# Patient Record
Sex: Female | Born: 1940 | Race: Black or African American | Hispanic: No | Marital: Single | Smoking: Current every day smoker
Health system: Southern US, Community
[De-identification: ages and names within clinical notes are randomized; demographics above are authoritative.]

## PROBLEM LIST (undated history)

## (undated) DIAGNOSIS — Z8679 Personal history of other diseases of the circulatory system: Secondary | ICD-10-CM

## (undated) DIAGNOSIS — K7689 Other specified diseases of liver: Secondary | ICD-10-CM

## (undated) DIAGNOSIS — J439 Emphysema, unspecified: Secondary | ICD-10-CM

## (undated) DIAGNOSIS — K279 Peptic ulcer, site unspecified, unspecified as acute or chronic, without hemorrhage or perforation: Secondary | ICD-10-CM

## (undated) DIAGNOSIS — I10 Essential (primary) hypertension: Secondary | ICD-10-CM

---

## 2017-11-07 DIAGNOSIS — R1032 Left lower quadrant pain: Secondary | ICD-10-CM | POA: Insufficient documentation

## 2017-11-07 DIAGNOSIS — R9431 Abnormal electrocardiogram [ECG] [EKG]: Secondary | ICD-10-CM | POA: Insufficient documentation

## 2017-11-07 DIAGNOSIS — F17200 Nicotine dependence, unspecified, uncomplicated: Secondary | ICD-10-CM | POA: Insufficient documentation

## 2020-02-13 ENCOUNTER — Ambulatory Visit: Payer: Self-pay | Admitting: Medical

## 2020-02-13 ENCOUNTER — Emergency Department (HOSPITAL_COMMUNITY): Payer: Medicare (Managed Care)

## 2020-02-13 ENCOUNTER — Ambulatory Visit (HOSPITAL_COMMUNITY)
Admission: EM | Admit: 2020-02-13 | Discharge: 2020-02-13 | Disposition: A | Discharge status: Home / Self Care | Payer: Self-pay | Attending: Family Medicine | Admitting: Family Medicine

## 2020-02-13 ENCOUNTER — Other Ambulatory Visit: Payer: Self-pay

## 2020-02-13 ENCOUNTER — Encounter (HOSPITAL_COMMUNITY): Payer: Self-pay | Admitting: Internal Medicine

## 2020-02-13 ENCOUNTER — Inpatient Hospital Stay (HOSPITAL_COMMUNITY)
Admission: EM | Admit: 2020-02-13 | Discharge: 2020-02-16 | DRG: 177 | Disposition: A | Discharge status: Home Health | Payer: Medicare (Managed Care) | Attending: Internal Medicine | Admitting: Internal Medicine

## 2020-02-13 DIAGNOSIS — F17210 Nicotine dependence, cigarettes, uncomplicated: Secondary | ICD-10-CM | POA: Diagnosis present

## 2020-02-13 DIAGNOSIS — F0391 Unspecified dementia with behavioral disturbance: Secondary | ICD-10-CM | POA: Diagnosis present

## 2020-02-13 DIAGNOSIS — I10 Essential (primary) hypertension: Secondary | ICD-10-CM | POA: Diagnosis not present

## 2020-02-13 DIAGNOSIS — I208 Other forms of angina pectoris: Secondary | ICD-10-CM | POA: Diagnosis present

## 2020-02-13 DIAGNOSIS — I5032 Chronic diastolic (congestive) heart failure: Secondary | ICD-10-CM | POA: Diagnosis present

## 2020-02-13 DIAGNOSIS — J441 Chronic obstructive pulmonary disease with (acute) exacerbation: Secondary | ICD-10-CM | POA: Diagnosis present

## 2020-02-13 DIAGNOSIS — K7689 Other specified diseases of liver: Secondary | ICD-10-CM | POA: Diagnosis present

## 2020-02-13 DIAGNOSIS — J440 Chronic obstructive pulmonary disease with acute lower respiratory infection: Secondary | ICD-10-CM | POA: Diagnosis present

## 2020-02-13 DIAGNOSIS — K279 Peptic ulcer, site unspecified, unspecified as acute or chronic, without hemorrhage or perforation: Secondary | ICD-10-CM | POA: Diagnosis present

## 2020-02-13 DIAGNOSIS — Z8711 Personal history of peptic ulcer disease: Secondary | ICD-10-CM

## 2020-02-13 DIAGNOSIS — I110 Hypertensive heart disease with heart failure: Secondary | ICD-10-CM | POA: Diagnosis present

## 2020-02-13 DIAGNOSIS — R0603 Acute respiratory distress: Secondary | ICD-10-CM

## 2020-02-13 DIAGNOSIS — R0602 Shortness of breath: Secondary | ICD-10-CM | POA: Diagnosis not present

## 2020-02-13 DIAGNOSIS — J9601 Acute respiratory failure with hypoxia: Secondary | ICD-10-CM | POA: Diagnosis present

## 2020-02-13 DIAGNOSIS — Z66 Do not resuscitate: Secondary | ICD-10-CM | POA: Diagnosis present

## 2020-02-13 DIAGNOSIS — U071 COVID-19: Secondary | ICD-10-CM | POA: Diagnosis present

## 2020-02-13 DIAGNOSIS — F0390 Unspecified dementia without behavioral disturbance: Secondary | ICD-10-CM | POA: Diagnosis not present

## 2020-02-13 DIAGNOSIS — R011 Cardiac murmur, unspecified: Secondary | ICD-10-CM | POA: Diagnosis present

## 2020-02-13 DIAGNOSIS — R0600 Dyspnea, unspecified: Principal | ICD-10-CM

## 2020-02-13 DIAGNOSIS — J449 Chronic obstructive pulmonary disease, unspecified: Secondary | ICD-10-CM | POA: Diagnosis present

## 2020-02-13 DIAGNOSIS — F03918 Unspecified dementia, unspecified severity, with other behavioral disturbance: Secondary | ICD-10-CM | POA: Diagnosis present

## 2020-02-13 DIAGNOSIS — R0682 Tachypnea, not elsewhere classified: Secondary | ICD-10-CM

## 2020-02-13 DIAGNOSIS — R600 Localized edema: Secondary | ICD-10-CM

## 2020-02-13 DIAGNOSIS — R9431 Abnormal electrocardiogram [ECG] [EKG]: Secondary | ICD-10-CM

## 2020-02-13 HISTORY — DX: Other specified diseases of liver: K76.89

## 2020-02-13 HISTORY — DX: Personal history of other diseases of the circulatory system: Z86.79

## 2020-02-13 HISTORY — DX: Essential (primary) hypertension: I10

## 2020-02-13 HISTORY — DX: Emphysema, unspecified: J43.9

## 2020-02-13 HISTORY — DX: Peptic ulcer, site unspecified, unspecified as acute or chronic, without hemorrhage or perforation: K27.9

## 2020-02-13 LAB — BASIC METABOLIC PANEL
Anion gap: 8 (ref 5–15)
BUN: 20 mg/dL (ref 8–23)
CO2: 35 mmol/L — ABNORMAL HIGH (ref 22–32)
Calcium: 8.9 mg/dL (ref 8.9–10.3)
Chloride: 96 mmol/L — ABNORMAL LOW (ref 98–111)
Creatinine, Ser: 0.97 mg/dL (ref 0.44–1.00)
GFR, Estimated: 59 mL/min — ABNORMAL LOW (ref >60)
Glucose, Bld: 92 mg/dL (ref 70–99)
Potassium: 3.5 mmol/L (ref 3.5–5.1)
Sodium: 139 mmol/L (ref 135–145)

## 2020-02-13 LAB — CBC WITH DIFFERENTIAL/PLATELET
Abs Immature Granulocytes: 0.02 10*3/uL (ref 0.00–0.07)
Basophils Absolute: 0 10*3/uL (ref 0.0–0.1)
Basophils Relative: 1 %
Eosinophils Absolute: 0 10*3/uL (ref 0.0–0.5)
Eosinophils Relative: 0 %
HCT: 44.5 % (ref 36.0–46.0)
Hemoglobin: 13.7 g/dL (ref 12.0–15.0)
Immature Granulocytes: 0 %
Lymphocytes Relative: 13 %
Lymphs Abs: 0.8 10*3/uL (ref 0.7–4.0)
MCH: 28.8 pg (ref 26.0–34.0)
MCHC: 30.8 g/dL (ref 30.0–36.0)
MCV: 93.5 fL (ref 80.0–100.0)
Monocytes Absolute: 0.7 10*3/uL (ref 0.1–1.0)
Monocytes Relative: 12 %
Neutro Abs: 4.7 10*3/uL (ref 1.7–7.7)
Neutrophils Relative %: 74 %
Platelets: 287 10*3/uL (ref 150–400)
RBC: 4.76 MIL/uL (ref 3.87–5.11)
RDW: 15.9 % — ABNORMAL HIGH (ref 11.5–15.5)
WBC: 6.2 10*3/uL (ref 4.0–10.5)
nRBC: 0 % (ref 0.0–0.2)

## 2020-02-13 LAB — TROPONIN I (HIGH SENSITIVITY)
Troponin I (High Sensitivity): 7 ng/L (ref <18)
Troponin I (High Sensitivity): 6 ng/L (ref <18)

## 2020-02-13 LAB — BRAIN NATRIURETIC PEPTIDE: B Natriuretic Peptide: 148.4 pg/mL — ABNORMAL HIGH (ref 0.0–100.0)

## 2020-02-13 LAB — D-DIMER, QUANTITATIVE: D-Dimer, Quant: 2.37 ug/mL-FEU — ABNORMAL HIGH (ref 0.00–0.50)

## 2020-02-13 MED ORDER — AEROCHAMBER PLUS FLO-VU MEDIUM MISC
1.0000 | Freq: Once | Status: DC
  Filled 2020-02-13: qty 1

## 2020-02-13 MED ORDER — ISOSORBIDE MONONITRATE ER 30 MG PO TB24
30.0000 mg | ORAL_TABLET | Freq: Every day | ORAL | Status: DC
Start: 2020-02-13 — End: 2020-02-16
  Administered 2020-02-13 – 2020-02-16 (×4): 30 mg via ORAL
  Filled 2020-02-13 (×5): qty 1

## 2020-02-13 MED ORDER — SODIUM CHLORIDE 0.9 % IV SOLN: INTRAVENOUS | Status: DC

## 2020-02-13 MED ORDER — SODIUM CHLORIDE 0.9 % IV SOLN: 250.0000 mL | INTRAVENOUS | Status: DC | PRN

## 2020-02-13 MED ORDER — LISINOPRIL 20 MG PO TABS
20.0000 mg | ORAL_TABLET | Freq: Every day | ORAL | Status: DC
Start: 2020-02-13 — End: 2020-02-16
  Administered 2020-02-13 – 2020-02-16 (×4): 20 mg via ORAL
  Filled 2020-02-13 (×4): qty 1

## 2020-02-13 MED ORDER — IPRATROPIUM BROMIDE HFA 17 MCG/ACT IN AERS
2.0000 | INHALATION_SPRAY | Freq: Once | RESPIRATORY_TRACT | Status: AC
  Administered 2020-02-13: 2 via RESPIRATORY_TRACT
  Filled 2020-02-13: qty 12.9

## 2020-02-13 MED ORDER — FAMOTIDINE 20 MG PO TABS
20.0000 mg | ORAL_TABLET | Freq: Two times a day (BID) | ORAL | Status: DC
Start: 2020-02-13 — End: 2020-02-16
  Administered 2020-02-13 – 2020-02-16 (×6): 20 mg via ORAL
  Filled 2020-02-13 (×6): qty 1

## 2020-02-13 MED ORDER — METHYLPREDNISOLONE SODIUM SUCC 40 MG IJ SOLR
40.0000 mg | Freq: Four times a day (QID) | INTRAMUSCULAR | Status: DC
  Administered 2020-02-13 – 2020-02-14 (×3): 40 mg via INTRAVENOUS
  Filled 2020-02-13 (×3): qty 1

## 2020-02-13 MED ORDER — HYDROCHLOROTHIAZIDE 12.5 MG PO CAPS
12.5000 mg | ORAL_CAPSULE | Freq: Every day | ORAL | Status: DC
  Administered 2020-02-13 – 2020-02-16 (×4): 12.5 mg via ORAL
  Filled 2020-02-13 (×4): qty 1

## 2020-02-13 MED ORDER — MOMETASONE FURO-FORMOTEROL FUM 200-5 MCG/ACT IN AERO
2.0000 | INHALATION_SPRAY | Freq: Two times a day (BID) | RESPIRATORY_TRACT | Status: DC
  Administered 2020-02-13 – 2020-02-16 (×5): 2 via RESPIRATORY_TRACT
  Filled 2020-02-13: qty 8.8

## 2020-02-13 MED ORDER — IOHEXOL 350 MG/ML SOLN
70.0000 mL | Freq: Once | INTRAVENOUS | Status: AC | PRN
  Administered 2020-02-13: 70 mL via INTRAVENOUS

## 2020-02-13 MED ORDER — AMLODIPINE BESYLATE 10 MG PO TABS
10.0000 mg | ORAL_TABLET | Freq: Every day | ORAL | Status: DC
  Administered 2020-02-13 – 2020-02-16 (×4): 10 mg via ORAL
  Filled 2020-02-13 (×2): qty 2
  Filled 2020-02-13 (×2): qty 1

## 2020-02-13 MED ORDER — SODIUM CHLORIDE 0.9% FLUSH: 3.0000 mL | INTRAVENOUS | Status: DC | PRN

## 2020-02-13 MED ORDER — ENOXAPARIN SODIUM 30 MG/0.3ML ~~LOC~~ SOLN
30.0000 mg | SUBCUTANEOUS | Status: DC
  Administered 2020-02-13 – 2020-02-14 (×2): 30 mg via SUBCUTANEOUS
  Filled 2020-02-13 (×3): qty 0.3

## 2020-02-13 MED ORDER — ALBUTEROL SULFATE HFA 108 (90 BASE) MCG/ACT IN AERS
2.0000 | INHALATION_SPRAY | Freq: Four times a day (QID) | RESPIRATORY_TRACT | Status: AC
  Administered 2020-02-13 – 2020-02-15 (×5): 2 via RESPIRATORY_TRACT
  Filled 2020-02-13 (×2): qty 6.7

## 2020-02-13 MED ORDER — SODIUM CHLORIDE 0.9% FLUSH
3.0000 mL | Freq: Two times a day (BID) | INTRAVENOUS | Status: DC
  Administered 2020-02-13 – 2020-02-16 (×6): 3 mL via INTRAVENOUS

## 2020-02-13 MED ORDER — METHYLPREDNISOLONE SODIUM SUCC 125 MG IJ SOLR
125.0000 mg | Freq: Once | INTRAMUSCULAR | Status: AC
  Administered 2020-02-13: 125 mg via INTRAVENOUS
  Filled 2020-02-13 (×2): qty 2

## 2020-02-13 MED ORDER — ALBUTEROL SULFATE HFA 108 (90 BASE) MCG/ACT IN AERS
4.0000 | INHALATION_SPRAY | Freq: Once | RESPIRATORY_TRACT | Status: AC
  Administered 2020-02-13: 4 via RESPIRATORY_TRACT
  Filled 2020-02-13: qty 6.7

## 2020-02-13 MED ORDER — MEMANTINE HCL 10 MG PO TABS
10.0000 mg | ORAL_TABLET | Freq: Every day | ORAL | Status: DC
  Administered 2020-02-13: 10 mg via ORAL
  Filled 2020-02-13 (×3): qty 1

## 2020-02-13 NOTE — ED Triage Notes (Signed)
Pt from urgent care via EMS in which they stated she was at  70% on R/A. Pt placed on 4 L [place]  is currently at 100%.  Denies Pain., N/V.   Pt visiting Daughter and is from [place]. Pt is a poor historian. Pt has HX of COPD and  CHF

## 2020-02-13 NOTE — ED Triage Notes (Signed)
EMS stated wheezes in upper lobes and Bi-lateral Pitting edema in lower extremeties

## 2020-02-13 NOTE — ED Notes (Signed)
Pt alert to self and location but not to year or president. EMS stated that Pt had no been compliant with Meds and is not sure of what meds she is on

## 2020-02-13 NOTE — H&P (Signed)
History and Physical    Patricia Terrell AJO:[id number] DOB: 01-Oct-1940 DOA: 02/13/2020  PCP: Pcp, No   Patient coming from: home/referred by urgent care  I have personally briefly reviewed patient's old medical records in [facility]  Chief Complaint: progressive SOB  HPI: Patricia Terrell is a 80 y.o. female with medical history significant of COPD, HTN, Liver cyst, rhematic fever, PUD has had multiple ED visits for COPD flares: most recently Aug '21, Oct /21. Today she was seen at Urgent Care for Shortness of breath - O2 sat 70% on room air. She was placed on oxygenat 3 L and referred to [facility]. She denied fever, chills, N/V/D. Endorsed DOE. Has had two covid vaccinations.   ED Course: T 97.8  170/83  HR 56  RR 24, Bmet nl, CBCD nl, BNP 148, Troponin 7, D-dimer 2.37. CXR - NAD, CTA chest -0 neg for PE, emphysema noted. TRH called to admit for management of COPD exacerbation.   Review of Systems: As per HPI otherwise 10 point review of systems negative.    Past Medical History:  Diagnosis Date  . Emphysema lung (HCC)   . H/O: rheumatic fever   . HTN (hypertension)   . Liver cyst   . PUD (peptic ulcer disease)     Past Surgical History:  Procedure Laterality Date  . CESAREAN SECTION      Soc Hx - married twice, widowed twice. She has 3 sons, 1 daughter, many grand-childen. Worked in mfg. Was living alone in [place] but due to dementia failed to pay rent and was evicted. Will be living with daughter.    reports that she has been smoking cigarettes. She has a 40.00 pack-year smoking history. She has never used smokeless tobacco. She reports previous alcohol use. She reports previous drug use.  No Known Allergies  No family history on file.   Prior to Admission medications   Not on File    Physical Exam: Vitals:   02/13/20 1504 02/13/20 1715 02/13/20 1906 02/13/20 2045  BP:  (!) 159/86 (!) 169/84 (!) 170/83  Pulse:   62 (!) 56  Resp:  19 18 (!) 24  Temp:      TempSrc:       SpO2:   99% 97%  Weight: 47.6 kg     Height: 5\' 5"  (1.651 m)        Vitals:   02/13/20 1504 02/13/20 1715 02/13/20 1906 02/13/20 2045  BP:  (!) 159/86 (!) 169/84 (!) 170/83  Pulse:   62 (!) 56  Resp:  19 18 (!) 24  Temp:      TempSrc:      SpO2:   99% 97%  Weight: 47.6 kg     Height: 5\' 5"  (1.651 m)      General: slender woman, confused, in no distress Eyes: PERRL, lids and conjunctivae normal ENMT: Mucous membranes are moist. Posterior pharynx clear of any exudate or lesions.Normal dentition.  Neck: normal, supple, no masses, no thyromegaly Respiratory: No accessory muscle use, no neck retractions. No respiratory distress at exam. Decreased Breath sounds, end-expiratory wheezing mild, no rales. Cardiovascular: Regular rate and rhythm, no murmurs / rubs / gallops. 1+ Lower extremity edema. 1+ pedal pulses. No carotid bruits.  Abdomen: no tenderness, no masses palpated. No hepatosplenomegaly. Bowel sounds positive.  Musculoskeletal: no clubbing / cyanosis. No joint deformity upper and lower extremities. Good ROM, no contractures. Normal muscle tone.  Skin: no rashes, lesions, ulcers. No induration Neurologic: CN 2-12 grossly intact.  Strength 5/5 in all 4.  Psychiatric:confused, speech clear, reasonable.. Alert and oriented to person, place, not to year or date, not to location. Normal mood.    Labs on Admission: I have personally reviewed following labs and imaging studies  CBC: Recent Labs  Lab 02/13/20 1606  WBC 6.2  NEUTROABS 4.7  HGB 13.7  HCT 44.5  MCV 93.5  PLT 287   Basic Metabolic Panel: Recent Labs  Lab 02/13/20 1606  NA 139  K 3.5  CL 96*  CO2 35*  GLUCOSE 92  BUN 20  CREATININE 0.97  CALCIUM 8.9   GFR: Estimated Creatinine Clearance: 35.3 mL/min (by C-G formula based on SCr of 0.97 mg/dL). Liver Function Tests: No results for input(s): AST, ALT, ALKPHOS, BILITOT, PROT, ALBUMIN in the last 168 hours. No results for input(s): LIPASE, AMYLASE in  the last 168 hours. No results for input(s): AMMONIA in the last 168 hours. Coagulation Profile: No results for input(s): INR, PROTIME in the last 168 hours. Cardiac Enzymes: No results for input(s): CKTOTAL, CKMB, CKMBINDEX, TROPONINI in the last 168 hours. BNP (last 3 results) No results for input(s): PROBNP in the last 8760 hours. HbA1C: No results for input(s): HGBA1C in the last 72 hours. CBG: No results for input(s): GLUCAP in the last 168 hours. Lipid Profile: No results for input(s): CHOL, HDL, LDLCALC, TRIG, CHOLHDL, LDLDIRECT in the last 72 hours. Thyroid Function Tests: No results for input(s): TSH, T4TOTAL, FREET4, T3FREE, THYROIDAB in the last 72 hours. Anemia Panel: No results for input(s): VITAMINB12, FOLATE, FERRITIN, TIBC, IRON, RETICCTPCT in the last 72 hours. Urine analysis: No results found for: COLORURINE, APPEARANCEUR, LABSPEC, PHURINE, GLUCOSEU, HGBUR, BILIRUBINUR, KETONESUR, PROTEINUR, UROBILINOGEN, NITRITE, LEUKOCYTESUR  Radiological Exams on Admission: CT Angio Chest PE W/Cm &/Or Wo Cm  Result Date: 02/13/2020 CLINICAL DATA:  Shortness of breath EXAM: CT ANGIOGRAPHY CHEST WITH CONTRAST TECHNIQUE: Multidetector CT imaging of the chest was performed using the standard protocol during bolus administration of intravenous contrast. Multiplanar CT image reconstructions and MIPs were obtained to evaluate the vascular anatomy. CONTRAST:  56mL OMNIPAQUE IOHEXOL 350 MG/ML SOLN COMPARISON:  Chest x-ray from earlier in the same day. FINDINGS: Cardiovascular: Thoracic aorta demonstrates atherosclerotic calcification without aneurysmal dilatation. The pulmonary artery shows a normal branching pattern bilaterally. No filling defect to suggest pulmonary embolism is noted. Cardiac enlargement is seen. The coronary arteries demonstrate scattered atherosclerotic change. Mediastinum/Nodes: Thoracic inlet is within normal limits. No sizable hilar or mediastinal adenopathy is noted. The  esophagus as visualized is within normal limits. Lungs/Pleura: Lungs demonstrate diffuse emphysematous change with multiple calcified pleural plaques and calcified granulomas bilaterally. No focal infiltrate or sizable effusion is seen. Upper Abdomen: Visualized upper abdomen shows no acute abnormality. Musculoskeletal: Degenerative changes of the thoracic spine. No acute bony abnormality is noted. Review of the MIP images confirms the above findings. 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 No acute infiltrate is seen. No evidence of pulmonary emboli. Aortic Atherosclerosis (ICD10-I70.0) and Emphysema (ICD10-J43.9). Electronically Signed   By: Alcide Clever M.D.   On: 02/13/2020 17:39   DG Chest Port 1 View  Result Date: 02/13/2020 CLINICAL DATA:  Short of breath for 1 week, altered level of consciousness EXAM: PORTABLE CHEST 1 VIEW COMPARISON:  None. FINDINGS: Single frontal view of the chest demonstrates an enlarged cardiac silhouette. There are numerous calcified nodules and calcified pleural plaques seen bilaterally. No acute airspace disease, effusion, or pneumothorax. No acute bony  abnormality. IMPRESSION: 1. Numerous calcified nodules and bilateral pleural plaques. Comparison with any prior studies would be useful. Findings could reflect previous granulomatous disease and/or asbestos exposure. 2. No acute airspace disease. Electronically Signed   By: Sharlet Salina M.D.   On: 02/13/2020 15:40    EKG: Independently reviewed. SR, IVCD, possible RBBB, questin of inferior ischemia.  Assessment/Plan Active Problems:   COPD with acute exacerbation (HCC)   HTN (hypertension)   PUD (peptic ulcer disease)   Dementia without behavioral disturbance (HCC)   Chronic diastolic CHF (congestive heart failure) (HCC)    1. COPD with acute exacerbation - patient presented with hypoxemia. She has been out of medications. This  is a chronic problem with flare. At time of exam she was improved - s/p[ iv solumedrol and nebulizer treatments Plan Med-surg admit  Continue oxygen support to keep satx>85%  Start LABA-steroid inhaler  SABA prn  solumderol 40 mg q 6 x 4 doses then taper off steroids  Smoking cessation  Nicotine patch  2. Cardiac - patient poor historian. 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 May have a component of CHF. Abnormal EKG but Troponin low at 7.  Plan Continue home meds: norvasc, imdur, HCTZ, ACE-I  Echo to evaluate for valvular dz, HF  3. HTN- continue home meds  4. PUD - by history. Will continue H2 blocker  5. Dementia - per daughter some memory problem for several years but getting much worse. Tonight patient not oriented to place, date or reason for being in ED. Daughter states that she will have her mother stay here in [place] with her (was living in [place]). Plan Namenda 10 mg daily  TOC consult    DVT prophylaxis: lovenox  Code Status: DNR - per daughter  Family Communication: Spoke with daughter Chinita [place],  [place] Dx and Tx plan Disposition Plan: TBD  Consults called: none (with names) Admission status: inpatient    Illene Regulus MD [facility] Pager [phone]  If 7PM-7AM, please contact night-coverage [email or web address] Password [facility]  02/13/2020, 10:04 PM

## 2020-02-13 NOTE — ED Provider Notes (Signed)
[facility]    CSN: [id number] Arrival date & time: 02/13/20  1349      History   Chief Complaint Chief Complaint  Patient presents with  . Shortness of Breath    HPI Beatryce Colombo is a 80 y.o. female.   HPI  Patient presents today accompanied by her daughter who reports she has had oxygen levels in the low 70s over the last 24 to 48 hours.  Patient only relocated here to [facility] from [place] approximately 1 week ago.  Daughter reports mother had not been taking good care of her self and has not been taking medication therefore medical history is limited.  Daughter and patient report at some point she was on oxygen and patient has a very extensive history of smoking and thinks that she had been diagnosed with COPD but denies any use of inhalers or nebulizer treatments.  She reports also that she has had cardiologist diagnosed her with a heart problem however is unaware of what the actual diagnosis is.  On arrival patient's oxygen level was initially 70% and once applied 3 L of oxygen oxygen level improved to 92%.  Patient does have a cough that is nonproductive but sounds very wet which she reports she has had for quite some time.  She is afebrile.  No past medical history on file.  There are no problems to display for this patient.     OB History   No obstetric history on file.      Home Medications    Prior to Admission medications   Not on File    Family History No family history on file.  Social History     Allergies   Patient has no allergy information on record.   Review of Systems Review of Systems Pertinent negatives listed in HPI Physical Exam Triage Vital Signs ED Triage Vitals  Enc Vitals Group     BP      Pulse      Resp      Temp      Temp src      SpO2      Weight      Height      Head Circumference      Peak Flow      Pain Score      Pain Loc      Pain Edu?      Excl. in GC?    No data found.  Updated  Vital Signs There were no vitals taken for this visit.  Visual Acuity Right Eye Distance:   Left Eye Distance:   Bilateral Distance:    Right Eye Near:   Left Eye Near:    Bilateral Near:     Physical Exam Constitutional:      General: She is in acute distress.     Appearance: She is ill-appearing.  Cardiovascular:     Rate and Rhythm: Normal rate. Rhythm irregular.     Heart sounds: Murmur heard.    Pulmonary:     Effort: Tachypnea, accessory muscle usage and respiratory distress present.     Breath sounds: Rales present.  Musculoskeletal:     Right lower leg: Edema present.     Left lower leg: Edema present.  Skin:    General: Skin is dry.     Capillary Refill: Capillary refill takes more than 3 seconds.  Neurological:     Mental Status: She is oriented to person, place, and time.  UC Treatments / Results  Labs (all labs ordered are listed, but only abnormal results are displayed) Labs Reviewed - No data to display  EKG Sinus rhythm with sinus arrhythmia with ventricular abnormality, ST elevation in V3 V2 ST depression in V5 and V6  Radiology No results found.  Procedures Procedures (including critical care time)  Medications Ordered in UC Medications - No data to display  Initial Impression / Assessment and Plan / UC Course  I have reviewed the triage vital signs and the nursing notes.  Pertinent labs & imaging results that were available during my care of the patient were reviewed by me and considered in my medical decision making (see chart for details).     Acute respiratory failure with abnormal ventricular findings on EKG. Patient is being transported via EMS to [facility] emergency department for further evaluation and management.  Information relayed to daughter who will accompany patient over at the ER after EMS transport. Patient has ZOLL attached and is currently on 3 L of O2. Final Clinical Impressions(s) / UC Diagnoses   Final  diagnoses:  Acute respiratory failure with hypoxia [facility])   Discharge Instructions   None    ED Prescriptions    None     PDMP not reviewed this encounter.   Bing Neighbors, FNP 02/13/20 1423

## 2020-02-13 NOTE — ED Notes (Signed)
Patient is being discharged from the Urgent Care and sent to the Emergency Department via EMS. Per K. Harris, NP, patient is in need of higher level of care due to hypoxia, EKG changes (WPW) Patient is aware and verbalizes understanding of plan of care.  Vitals:   02/13/20 1411 02/13/20 1417  BP: (!) 147/118   Pulse: (!) 58   Resp: (!) 32   Temp: 98.4 F (36.9 C)   SpO2: (!) 77% 92%

## 2020-02-13 NOTE — ED Provider Notes (Signed)
[facility] EMERGENCY DEPARTMENT Provider Note   CSN: [id number] Arrival date & time: 02/13/20  1449     History Chief Complaint  Patient presents with  . Shortness of Breath    Patricia Terrell is a 80 y.o. female.  80 year old female with history of COPD and CHF presents with increased dyspnea x1 day.  Apparently has been noncompliant with her home medications and was seen in urgent care today and a pulse oximetry was noted to be 70%.  He was noted to have wheezing as well 2.  Placed on 3 L of oxygen.  Patient states she is chronically short of breath and that she denies any chest pain or chest pressure.  No fever or chills.  States that she has had 2 doses of the COVID-vaccine.  No vomiting or diarrhea.  No pleuritic component to her symptoms.  Does note increased dyspnea on exertion.  No change in her chronic leg swelling        No past medical history on file.  There are no problems to display for this patient.      OB History   No obstetric history on file.     No family history on file.     Home Medications Prior to Admission medications   Not on File    Allergies    Patient has no allergy information on record.  Review of Systems   Review of Systems  All other systems reviewed and are negative.   Physical Exam Updated Vital Signs BP (!) 174/81 (BP Location: Right Arm)   Pulse (!) 58   Temp 97.8 F (36.6 C) (Oral)   Resp (!) 21   Ht 1.651 m (5\' 5" )   Wt 47.6 kg   SpO2 100%   BMI 17.47 kg/m   Physical Exam Vitals and nursing note reviewed.  Constitutional:      General: She is not in acute distress.    Appearance: Normal appearance. She is well-developed and well-nourished. She is not toxic-appearing.  HENT:     Head: Normocephalic and atraumatic.  Eyes:     General: Lids are normal.     Extraocular Movements: EOM normal.     Conjunctiva/sclera: Conjunctivae normal.     Pupils: Pupils are equal, round, and reactive to  light.  Neck:     Thyroid: No thyroid mass.     Trachea: No tracheal deviation.  Cardiovascular:     Rate and Rhythm: Normal rate and regular rhythm.     Heart sounds: Normal heart sounds. No murmur heard. No gallop.   Pulmonary:     Effort: Pulmonary effort is normal. No respiratory distress.     Breath sounds: No stridor. Examination of the right-lower field reveals decreased breath sounds and wheezing. Examination of the left-lower field reveals decreased breath sounds and wheezing. Decreased breath sounds and wheezing present. No rhonchi or rales.  Abdominal:     General: Bowel sounds are normal. There is no distension.     Palpations: Abdomen is soft.     Tenderness: There is no abdominal tenderness. There is no CVA tenderness or rebound.  Musculoskeletal:        General: No tenderness or edema. Normal range of motion.     Cervical back: Normal range of motion and neck supple.     Comments: Bilateral 2+ pitting edema worse on the right than left  Skin:    General: Skin is warm and dry.     Findings: No  abrasion or rash.  Neurological:     Mental Status: She is alert and oriented to person, place, and time.     GCS: GCS eye subscore is 4. GCS verbal subscore is 5. GCS motor subscore is 6.     Cranial Nerves: No cranial nerve deficit.     Sensory: No sensory deficit.     Deep Tendon Reflexes: Strength normal.  Psychiatric:        Mood and Affect: Mood and affect normal.        Speech: Speech normal.        Behavior: Behavior normal.     ED Results / Procedures / Treatments   Labs (all labs ordered are listed, but only abnormal results are displayed) Labs Reviewed  CBC WITH DIFFERENTIAL/PLATELET  BASIC METABOLIC PANEL  BRAIN NATRIURETIC PEPTIDE  D-DIMER, QUANTITATIVE (NOT AT [facility])  POC SARS CORONAVIRUS 2 AG -  ED    EKG EKG Interpretation  Date/Time:  Friday February 13 2020 14:49:53 EST Ventricular Rate:  60 PR Interval:    QRS Duration: 132 QT  Interval:  446 QTC Calculation: 446 R Axis:   55 Text Interpretation: Sinus rhythm Probable left atrial enlargement IVCD, consider atypical RBBB LVH with IVCD and secondary repol abnrm ST depr, consider ischemia, inferior leads No significant change since last tracing Confirmed by Lorre Nick ([id number]) on 02/13/2020 3:05:33 PM   Radiology No results found.  Procedures Procedures (including critical care time)  Medications Ordered in ED Medications  0.9 %  sodium chloride infusion (has no administration in time range)    ED Course  I have reviewed the triage vital signs and the nursing notes.  Pertinent labs & imaging results that were available during my care of the patient were reviewed by me and considered in my medical decision making (see chart for details).    MDM Rules/Calculators/A&P                         Patient short of breath here with elevated D-dimer.  CT chest negative for PE.  Does have history of COPD.  Given Solu-Medrol as well as albuterol.  Does require oxygen at this time.  Will admit to the hospital  Final Clinical Impression(s) / ED Diagnoses Final diagnoses:  None    Rx / DC Orders ED Discharge Orders    None       Lorre Nick, MD 02/13/20 [phone]

## 2020-02-13 NOTE — ED Notes (Signed)
911 called, asked cardiac monitor, oxygen and emergency transport. [employer] verified.

## 2020-02-13 NOTE — ED Triage Notes (Signed)
Pt c/o SOB for approx 1 week. Daughter reports pt with declined mental status recently, but uncertain of length of change and details 2/2 pt living in [place]. Pt in town for  Past week. Jerrilyn Cairo, NP at [facility] for eval. Pt oriented to situation, person.  SaO2 77% on RA, tachypnea at 32 RR. Bilateral lung sounds with diffuse coarse crackles.  +3 edema to BLE, pt reports h/o COPD, rheumatic fever, "heart problems." S1 S2 irreg rate/rhythm Pt placed on O2 at 2L [place] and SaO2 improved to 86; increased to 3L  with SaO2 improved to 92%. EKG performed.  Pt denies recent URI sx, fever, n/v/d, CP,  EMS called by clinical staff. Pt's daughter reports that pt had not been taking Rx at home and did not bring any.

## 2020-02-13 NOTE — ED Notes (Signed)
Pt currently on room air at 97%

## 2020-02-14 DIAGNOSIS — J9601 Acute respiratory failure with hypoxia: Secondary | ICD-10-CM

## 2020-02-14 DIAGNOSIS — U071 COVID-19: Principal | ICD-10-CM

## 2020-02-14 LAB — BASIC METABOLIC PANEL
Anion gap: 7 (ref 5–15)
BUN: 20 mg/dL (ref 8–23)
CO2: 31 mmol/L (ref 22–32)
Calcium: 8.4 mg/dL — ABNORMAL LOW (ref 8.9–10.3)
Chloride: 100 mmol/L (ref 98–111)
Creatinine, Ser: 0.92 mg/dL (ref 0.44–1.00)
GFR, Estimated: >60 mL/min (ref >60)
Glucose, Bld: 145 mg/dL — ABNORMAL HIGH (ref 70–99)
Potassium: 4.4 mmol/L (ref 3.5–5.1)
Sodium: 138 mmol/L (ref 135–145)

## 2020-02-14 LAB — SARS CORONAVIRUS 2 (TAT 6-24 HRS): SARS Coronavirus 2: POSITIVE — AB

## 2020-02-14 MED ORDER — SODIUM CHLORIDE 0.9 % IV SOLN
200.0000 mg | Freq: Once | INTRAVENOUS | Status: AC
  Administered 2020-02-14: 200 mg via INTRAVENOUS
  Filled 2020-02-14: qty 40

## 2020-02-14 MED ORDER — METHYLPREDNISOLONE SODIUM SUCC 40 MG IJ SOLR
40.0000 mg | Freq: Two times a day (BID) | INTRAMUSCULAR | Status: DC
  Administered 2020-02-14 – 2020-02-15 (×2): 40 mg via INTRAVENOUS
  Filled 2020-02-14 (×2): qty 1

## 2020-02-14 MED ORDER — SODIUM CHLORIDE 0.9 % IV SOLN
100.0000 mg | Freq: Every day | INTRAVENOUS | Status: AC
  Administered 2020-02-15 – 2020-02-16 (×2): 100 mg via INTRAVENOUS
  Filled 2020-02-14 (×2): qty 20

## 2020-02-14 NOTE — ED Notes (Signed)
Alarm sounded in patient room, upon RN entering patient standing by door. Patient reoriented and ambulated back to bed. SPO2 on RA after brief ambulation 67%. Patient placed back on 4L, SpO2 increased to 96%. Patient had also removed IV from [facility], cannula noted to be intact, site with no active bleeding. New IV initiated. Again reoriented patient, call bell in reach. Stretcher in low position. Side rails up x2.

## 2020-02-14 NOTE — ED Notes (Signed)
Gave patient her breakfast tray patient is sitting up eating patient oxygen tubing with on the floor place patient oxygen tubing back patient is resting with her call bell in reach

## 2020-02-14 NOTE — ED Notes (Signed)
Pt upset, continuously getting up and leaving the room asking staff about her wallet that she claims is lost. Pt is not keeping on her monitoring equipment or her oxygen. RN notified.

## 2020-02-14 NOTE — ED Notes (Signed)
Patient removed monitor equipment and O2 again. Patient reattached to same and reoriented again.

## 2020-02-14 NOTE — ED Notes (Addendum)
Patient sitting on end of stretcher. Had removed some monitoring equipment again. Same replaced. Patient reoriented. HOB elevated and TV turned on for distraction. Warm blankets provided.

## 2020-02-14 NOTE — Progress Notes (Addendum)
PROGRESS NOTE  Patricia Terrell ZOX:[id number] DOB: 11/23/1940 DOA: 02/13/2020  PCP: Pcp, No  Brief History/Interval Summary: 80 y.o. female with medical history significant of COPD, HTN, Liver cyst, rhematic fever, PUD has had multiple ED visits for COPD flares: most recently Aug '21, Oct /21.  Patient was recently brought here from [place] to be with her daughter.  She was seen at Urgent Care for Shortness of breath - O2 sat 70% on room air. She was placed on oxygenat 3 L and referred to [facility]. Has had two covid vaccinations.   She was hospitalized for further management.  She tested positive for COVID-19   Reason for Visit: COPD exacerbation.  COVID-19 infection  Consultants: None  Procedures: None  Antibiotics: Anti-infectives (From admission, onward)   None      Subjective/Interval History: Patient pleasantly confused.  Denies any chest pain nausea vomiting.  Occasional cough.  Denies any difficulty breathing.  She states that she is feeling better today compared to yesterday.    Assessment/Plan:  Acute Hypoxic Resp. Failure/COVID-19 infection   Recent Labs  Lab 02/13/20 1606  DDIMER 2.37*      Objective findings: Fever: Noted to be afebrile Oxygen requirements: Remains on 2 to 3 L of oxygen with saturations in the mid to late 90s.    COVID 19 Therapeutics: Antibacterials: None currently Remdesivir: We will give her a 3-day course of Remdesivir Steroids: On Solu-Medrol Diuretics: HCTZ being continued Inhaled Steroids: On Dulera Actemra/Baricitinib: No indication yet PUD Prophylaxis: Famotidine DVT Prophylaxis:  Lovenox  Patient tested positive on 1/21  Discussed with patient's daughter.  She was brought here from [place] about a week ago.  No known exposure to COVID-19 during this time..  No known exposure of any [place] either.  Patient's daughter does not have any symptoms suggestive of COVID-19.  Unclear if her presentation was due to COVID-19 or  COPD exacerbation or combination of both.  Reassuringly imaging studies did not show any opacities in the lungs.  Patient thought to have mild infection at this time.  We will give her a 3-day course of Remdesivir.  CT angiogram did not show any PE.  Trend D-dimer.  Check inflammatory markers.  CT angiogram did show multiple calcified pleural plaques and calcified granulomas.  Outpatient follow-up with pulmonology.  Incentive spirometer.  Mobilization.  The treatment plan and use of medications and known side effects were discussed with patient/family. Some of the medications used are based on case reports/anecdotal data.  All other medications being used in the management of COVID-19 based on limited study data.  Complete risks and long-term side effects are unknown, however in the best clinical judgment they seem to be of some benefit.  Patient/family wanted to proceed with treatment options provided.  COPD with acute exacerbation Patient was noted to be wheezing yesterday.  No wheezing appreciated at this time.  She seems to be improving.  She was started on nebulizer treatment and steroids.  We will discontinue the nebulizer treatments she is and says she is positive for COVID-19.  Cut back on steroids.  Wean down oxygen to keep saturations greater than 88%.  Continue with her inhaled steroids.  Unfortunately patient continues to smoke cigarettes.  She was counseled.  Essential hypertension Continue home medications.  Systolic murmur was appreciated on examination.  Outpatient setting.  We will hold off on echocardiogram at this time.  History of dementia Per daughter she has had memory problems for several years but has  been getting worse.  She is pleasantly confused.  No neurological deficits.  Outpatient follow-up with PCP.  Not noted to be on any medications for dementia at home.     DVT Prophylaxis: Lovenox Code Status: DNR Family Communication: Discussed with patient's  daughter Disposition Plan: Hopefully return home with daughter when improved  Status is: Inpatient  Remains inpatient appropriate because:IV treatments appropriate due to intensity of illness or inability to take PO and Inpatient level of care appropriate due to severity of illness   Dispo: The patient is from: Home              Anticipated d/c is to: Home              Anticipated d/c date is: 2 days              Patient currently is not medically stable to d/c.   Difficult to place patient No      Medications:  Scheduled: Marland Kitchen AeroChamber Plus Flo-Vu Medium  1 each Other Once  . albuterol  2 puff Inhalation Q6H  . amLODipine  10 mg Oral Daily  . enoxaparin (LOVENOX) injection  30 mg Subcutaneous Q24H  . famotidine  20 mg Oral BID  . hydrochlorothiazide  12.5 mg Oral Daily  . isosorbide mononitrate  30 mg Oral Daily  . lisinopril  20 mg Oral Daily  . memantine  10 mg Oral Daily  . methylPREDNISolone (SOLU-MEDROL) injection  40 mg Intravenous Q6H  . mometasone-formoterol  2 puff Inhalation BID  . sodium chloride flush  3 mL Intravenous Q12H   Continuous: . sodium chloride 10 mL/hr at 02/14/20 0246  . sodium chloride     ZOX:WRUEAV chloride, sodium chloride flush   Objective:  Vital Signs  Vitals:   02/14/20 0930 02/14/20 0932 02/14/20 0945 02/14/20 1000  BP: 123/75 123/75  134/68  Pulse: (!) 53  (!) 50 (!) 51  Resp: 15  19 (!) 21  Temp:      TempSrc:      SpO2: 96%  95% 96%  Weight:      Height:        Intake/Output Summary (Last 24 hours) at 02/14/2020 1120 Last data filed at 02/14/2020 0934 Gross per 24 hour  Intake 3 ml  Output --  Net 3 ml   Filed Weights   02/13/20 1504  Weight: 47.6 kg    General appearance: Awake alert.  In no distress.  Pleasantly confused Resp: Mildly tachypneic.  Coarse breath sounds bilaterally with few crackles at the bases.  No wheezing or rhonchi appreciated today. Cardio: S1-S2 is normal regular.  No S3-S4.  Systolic  murmur over the precordium GI: Abdomen is soft.  Nontender nondistended.  Bowel sounds are present normal.  No masses organomegaly Extremities: No edema.  Full range of motion of lower extremities. Neurologic: Disoriented.  No focal neurological deficits.    Lab Results:  Data Reviewed: I have personally reviewed following labs and imaging studies  CBC: Recent Labs  Lab 02/13/20 1606  WBC 6.2  NEUTROABS 4.7  HGB 13.7  HCT 44.5  MCV 93.5  PLT 287    Basic Metabolic Panel: Recent Labs  Lab 02/13/20 1606 02/14/20 0350  NA 139 138  K 3.5 4.4  CL 96* 100  CO2 35* 31  GLUCOSE 92 145*  BUN 20 20  CREATININE 0.97 0.92  CALCIUM 8.9 8.4*    GFR: Estimated Creatinine Clearance: 37.3 mL/min (by C-G formula based on SCr  of 0.92 mg/dL).   Recent Results (from the past 240 hour(s))  SARS CORONAVIRUS 2 (TAT 6-24 HRS) Nasopharyngeal Nasopharyngeal Swab     Status: Abnormal   Collection Time: 02/13/20  5:40 PM   Specimen: Nasopharyngeal Swab  Result Value Ref Range Status   SARS Coronavirus 2 POSITIVE (A) NEGATIVE Final    Comment: (NOTE) SARS-CoV-2 target nucleic acids are DETECTED.  The SARS-CoV-2 RNA is generally detectable in upper and lower respiratory specimens during the acute phase of infection. Positive results are indicative of the presence of SARS-CoV-2 RNA. Clinical correlation with patient history and other diagnostic information is  necessary to determine patient infection status. Positive results do not rule out bacterial infection or co-infection with other viruses.  The expected result is Negative.  Fact Sheet for Patients: HairSlick.no  Fact Sheet for Healthcare Providers: quierodirigir.com  This test is not yet approved or cleared by the [place] FDA and  has been authorized for detection and/or diagnosis of SARS-CoV-2 by FDA under an Emergency Use Authorization (EUA). This EUA will remain   in effect (meaning this test can be used) for the duration of the COVID-19 declaration under Section 564(b)(1) of the Act, 21 U. S.C. section 360bbb-3(b)(1), unless the authorization is terminated or revoked sooner.   Performed at [facility], [address] [place], [place] [address]       Radiology Studies: CT Angio Chest PE W/Cm &/Or Wo Cm  Result Date: 02/13/2020 CLINICAL DATA:  Shortness of breath EXAM: CT ANGIOGRAPHY CHEST WITH CONTRAST TECHNIQUE: Multidetector CT imaging of the chest was performed using the standard protocol during bolus administration of intravenous contrast. Multiplanar CT image reconstructions and MIPs were obtained to evaluate the vascular anatomy. CONTRAST:  35mL OMNIPAQUE IOHEXOL 350 MG/ML SOLN COMPARISON:  Chest x-ray from earlier in the same day. FINDINGS: Cardiovascular: Thoracic aorta demonstrates atherosclerotic calcification without aneurysmal dilatation. The pulmonary artery shows a normal branching pattern bilaterally. No filling defect to suggest pulmonary embolism is noted. Cardiac enlargement is seen. The coronary arteries demonstrate scattered atherosclerotic change. Mediastinum/Nodes: Thoracic inlet is within normal limits. No sizable hilar or mediastinal adenopathy is noted. The esophagus as visualized is within normal limits. Lungs/Pleura: Lungs demonstrate diffuse emphysematous change with multiple calcified pleural plaques and calcified granulomas bilaterally. No focal infiltrate or sizable effusion is seen. Upper Abdomen: Visualized upper abdomen shows no acute abnormality. Musculoskeletal: Degenerative changes of the thoracic spine. No acute bony abnormality is noted. Review of the MIP images confirms the above findings. 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 No acute infiltrate is seen. No evidence of pulmonary emboli. Aortic Atherosclerosis (ICD10-I70.0)  and Emphysema (ICD10-J43.9). Electronically Signed   By: Alcide Clever M.D.   On: 02/13/2020 17:39   DG Chest Port 1 View  Result Date: 02/13/2020 CLINICAL DATA:  Short of breath for 1 week, altered level of consciousness EXAM: PORTABLE CHEST 1 VIEW COMPARISON:  None. FINDINGS: Single frontal view of the chest demonstrates an enlarged cardiac silhouette. There are numerous calcified nodules and calcified pleural plaques seen bilaterally. No acute airspace disease, effusion, or pneumothorax. No acute bony abnormality. IMPRESSION: 1. Numerous calcified nodules and bilateral pleural plaques. Comparison with any prior studies would be useful. Findings could reflect previous granulomatous disease and/or asbestos exposure. 2. No acute airspace disease. Electronically Signed   By: Sharlet Salina M.D.   On: 02/13/2020 15:40       LOS: 1 day  Kerrigan Gombos [facility]  [employer] Web designer on [email or web address]  02/14/2020, 11:20 AM

## 2020-02-14 NOTE — Care Management (Signed)
[facility] in patient instructions to establish PCP

## 2020-02-15 LAB — COMPREHENSIVE METABOLIC PANEL
ALT: 11 U/L (ref 0–44)
AST: 15 U/L (ref 15–41)
Albumin: 2.3 g/dL — ABNORMAL LOW (ref 3.5–5.0)
Alkaline Phosphatase: 43 U/L (ref 38–126)
Anion gap: 8 (ref 5–15)
BUN: 23 mg/dL (ref 8–23)
CO2: 31 mmol/L (ref 22–32)
Calcium: 8.7 mg/dL — ABNORMAL LOW (ref 8.9–10.3)
Chloride: 99 mmol/L (ref 98–111)
Creatinine, Ser: 0.84 mg/dL (ref 0.44–1.00)
GFR, Estimated: >60 mL/min (ref >60)
Glucose, Bld: 122 mg/dL — ABNORMAL HIGH (ref 70–99)
Potassium: 4.4 mmol/L (ref 3.5–5.1)
Sodium: 138 mmol/L (ref 135–145)
Total Bilirubin: 0.6 mg/dL (ref 0.3–1.2)
Total Protein: 6.1 g/dL — ABNORMAL LOW (ref 6.5–8.1)

## 2020-02-15 LAB — CBC
HCT: 36.8 % (ref 36.0–46.0)
Hemoglobin: 11.6 g/dL — ABNORMAL LOW (ref 12.0–15.0)
MCH: 28.9 pg (ref 26.0–34.0)
MCHC: 31.5 g/dL (ref 30.0–36.0)
MCV: 91.5 fL (ref 80.0–100.0)
Platelets: 273 10*3/uL (ref 150–400)
RBC: 4.02 MIL/uL (ref 3.87–5.11)
RDW: 15.8 % — ABNORMAL HIGH (ref 11.5–15.5)
WBC: 8.8 10*3/uL (ref 4.0–10.5)
nRBC: 0 % (ref 0.0–0.2)

## 2020-02-15 LAB — C-REACTIVE PROTEIN: CRP: 3.5 mg/dL — ABNORMAL HIGH (ref <1.0)

## 2020-02-15 LAB — D-DIMER, QUANTITATIVE: D-Dimer, Quant: 1.23 ug/mL-FEU — ABNORMAL HIGH (ref 0.00–0.50)

## 2020-02-15 MED ORDER — PREDNISONE 20 MG PO TABS
40.0000 mg | ORAL_TABLET | Freq: Every day | ORAL | Status: DC
  Administered 2020-02-16: 40 mg via ORAL
  Filled 2020-02-15: qty 2

## 2020-02-15 MED ORDER — HALOPERIDOL LACTATE 5 MG/ML IJ SOLN
1.0000 mg | Freq: Four times a day (QID) | INTRAMUSCULAR | Status: DC | PRN
  Administered 2020-02-15: 1 mg via INTRAVENOUS
  Filled 2020-02-15: qty 1

## 2020-02-15 MED ORDER — HALOPERIDOL LACTATE 5 MG/ML IJ SOLN
1.0000 mg | Freq: Once | INTRAMUSCULAR | Status: AC
  Administered 2020-02-15: 1 mg via INTRAVENOUS
  Filled 2020-02-15: qty 1

## 2020-02-15 MED ORDER — ENOXAPARIN SODIUM 40 MG/0.4ML ~~LOC~~ SOLN
40.0000 mg | SUBCUTANEOUS | Status: DC
  Administered 2020-02-15: 40 mg via SUBCUTANEOUS
  Filled 2020-02-15: qty 0.4

## 2020-02-15 MED ORDER — ALBUTEROL SULFATE HFA 108 (90 BASE) MCG/ACT IN AERS
2.0000 | INHALATION_SPRAY | RESPIRATORY_TRACT | Status: DC | PRN
  Filled 2020-02-15: qty 6.7

## 2020-02-15 MED ORDER — QUETIAPINE FUMARATE 25 MG PO TABS
25.0000 mg | ORAL_TABLET | Freq: Every day | ORAL | Status: DC
  Administered 2020-02-15: 25 mg via ORAL
  Filled 2020-02-15: qty 1

## 2020-02-15 NOTE — TOC Initial Note (Addendum)
Transition of Care [facility]) - Initial/Assessment Note    Patient Details  Name: Patricia Terrell MRN: [id number] Date of Birth: March 29, 1940  [facility]) CM/SW Contact:    Lawerance Sabal, RN Phone Number: 02/15/2020, 2:45 PM  Clinical Narrative:                Notes indicate patient is not fully oriented. Deferred conversation to her daughter, Tora Duck.  She states that her mother was living in a senior apartment complex in [place] until she moved her to [place] to live with her at: [address], [address], [place], [place] [address] this month.  The plan is for the patient to permanently relocate to this address. When she was living in [place] on her own, she was not managing her finances, not paying bills including rent, not filling or taking prescriptions, and was unable to care for herself. Patient's daughter works at [employer] 2nd or 3rd shift, it will be just the two of them at home and patient will likely need some supervision with [facility] arranged or SNF. PT OT evals are pending. Discussed this with Sheryl and she was open to the possibility of SNF if needed. We also discussed limiting factors to placement such as insurance, and COVID.  Home oxygen has been ordered by MD, once PT evals complete and choice for disposition is made, home vs SNF, DME will be ordered.   She requested Dr Aleen Campi at [facility] for PCP, but they were unable to accept insurance. (see insurance card images below, as provided by the patient's daughter). Will need PCP appointment made prior to DC to ensure that patient will have the ability to get refills. Patient can initially follow up at the COVID clinic(added to [facility]) and the clinic will help them establish with a PCP.   We discussed how to apply for [place] Medicaid.    TOC following for choice of disposition, home oxygen, HH/DME, and PCP appointment           Chinita Greenland Daughter   [phone]      Expected Discharge Plan:  (TBD) Barriers to  Discharge: Continued Medical Work up   Patient Goals and CMS Choice Patient states their goals for this hospitalization and ongoing recovery are:: to be determined based on PT OT evals      Expected Discharge Plan and Services Expected Discharge Plan:  (TBD)   Discharge Planning Services: CM Consult   Living arrangements for the past 2 months: Apartment                                      Prior Living Arrangements/Services Living arrangements for the past 2 months: Apartment Lives with:: Adult Children                   Activities of Daily Living Home Assistive Devices/Equipment: None ADL Screening (condition at time of admission) Patient's cognitive ability adequate to safely complete daily activities?: No Is the patient deaf or have difficulty hearing?: No Does the patient have difficulty seeing, even when wearing glasses/contacts?: No Does the patient have difficulty concentrating, remembering, or making decisions?: Yes Patient able to express need for assistance with ADLs?: No Does the patient have difficulty dressing or bathing?: Yes Independently performs ADLs?: No Does the patient have difficulty walking or climbing stairs?: Yes Weakness of Legs: Both Weakness of Arms/Hands: None  Permission Sought/Granted  Emotional Assessment              Admission diagnosis:  COPD with acute exacerbation (HCC) [J44.1] Dyspnea, unspecified type [R06.00] Patient Active Problem List   Diagnosis Date Noted  . COPD with acute exacerbation (HCC) 02/13/2020  . HTN (hypertension) 02/13/2020  . PUD (peptic ulcer disease) 02/13/2020  . Dementia without behavioral disturbance (HCC) 02/13/2020  . Chronic diastolic CHF (congestive heart failure) (HCC) 02/13/2020   PCP:  Pcp, No Pharmacy:   [facility] [phone] - [place], [place] - [address] AT [facility] [address] [place]  [address] Phone: [phone] Fax: [phone]     Social Determinants of Health (SDOH) Interventions    Readmission Risk Interventions No flowsheet data found.

## 2020-02-15 NOTE — Progress Notes (Signed)
Pt will not keep 02 on. MD was notified and asked for something to help calm pt. I have offered to call daughter but pt denies and is very agitated and anxious. Haldol was given but pt has not calmed down. Pt will need sitter if this behavior cont. I have been in rm every 15-30 min all day.

## 2020-02-15 NOTE — Progress Notes (Signed)
PROGRESS NOTE  Patricia Terrell POE:[id number] DOB: 1940-10-19 DOA: 02/13/2020  PCP: Pcp, No  Brief History/Interval Summary: 80 y.o. female with medical history significant of COPD, HTN, Liver cyst, rhematic fever, PUD has had multiple ED visits for COPD flares: most recently Aug '21, Oct /21.  Patient was recently brought here from [place] to be with her daughter.  She was seen at Urgent Care for Shortness of breath - O2 sat 70% on room air. She was placed on oxygenat 3 L and referred to [facility]. Has had two covid vaccinations.   She was hospitalized for further management.  She tested positive for COVID-19   Reason for Visit: COPD exacerbation.  COVID-19 infection  Consultants: None  Procedures: None  Antibiotics: Anti-infectives (From admission, onward)   Start     Dose/Rate Route Frequency Ordered Stop   02/15/20 1000  remdesivir 100 mg in sodium chloride 0.9 % 100 mL IVPB       "Followed by" Linked Group Details   100 mg 200 mL/hr over 30 Minutes Intravenous Daily 02/14/20 1156 02/17/20 0959   02/14/20 1400  remdesivir 200 mg in sodium chloride 0.9% 250 mL IVPB       "Followed by" Linked Group Details   200 mg 580 mL/hr over 30 Minutes Intravenous Once 02/14/20 1156 02/14/20 1517      Subjective/Interval History: Patient remains pleasantly confused.  Episodes of agitation overnight noted.  Was sleeping this morning but easily arousable.  Mentions that she is feeling better.      Assessment/Plan:  Acute Hypoxic Resp. Failure/COVID-19 infection   Recent Labs  Lab 02/13/20 1606 02/15/20 0201  DDIMER 2.37* 1.23*  CRP  --  3.5*  ALT  --  11      Objective findings: Fever: Remains afebrile Oxygen requirements: Noted to be on 4 L of oxygen saturating in the late 90s.  Reduced to 2 L.  Nursing staff notified.    COVID 19 Therapeutics: Antibacterials: None currently Remdesivir: Day on a 3-day course.  Day 2 today. Steroids: On Solu-Medrol Diuretics: HCTZ being  continued Inhaled Steroids: On Dulera Actemra/Baricitinib: No indication yet PUD Prophylaxis: Famotidine DVT Prophylaxis:  Lovenox  Patient tested positive on 1/21  Discussed with patient's daughter.  She was brought here from [place] about a week ago.  No known exposure to COVID-19 during this time..  No known exposure of any [place] either.  Patient's daughter does not have any symptoms suggestive of COVID-19.  Unclear if her presentation was due to COVID-19 or COPD exacerbation or combination of both.  Reassuringly imaging studies did not show any opacities in the lungs.  Patient thought to have mild infection at this time.  She is currently getting a 3-day course of Remdesivir.  CT angiogram did not show any PE.  D-dimer is trending downwards.  Continue to wean down oxygen.  Might need to be discharged with home oxygen.  Incentive spirometer.  Mobilization.  The treatment plan and use of medications and known side effects were discussed with patient/family. Some of the medications used are based on case reports/anecdotal data.  All other medications being used in the management of COVID-19 based on limited study data.  Complete risks and long-term side effects are unknown, however in the best clinical judgment they seem to be of some benefit.  Patient/family wanted to proceed with treatment options provided.  COPD with acute exacerbation Patient was noted to be wheezing at the time of admission.  She was given  nebulizer treatment and steroids with improvement.  Continues to require oxygen.  Will likely need to go home with same.  Start weaning down steroids.  Continue with her inhaled steroids.  Unfortunately patient continues to smoke cigarettes.  She was counseled. CT angiogram did show multiple calcified pleural plaques and calcified granulomas.  Outpatient follow-up with pulmonology.  Essential hypertension Continue home medications.  Systolic murmur was appreciated on examination.   May need echocardiogram but can be perceived in the outpatient setting.  History of dementia Per daughter she has had memory problems for several years but has been getting worse.  She is pleasantly confused.  No neurological deficits.  Outpatient follow-up with PCP.  Not noted to be on any medications for dementia at home.     DVT Prophylaxis: Lovenox Code Status: DNR Family Communication: Discussed with patient's daughter Disposition Plan: Hopefully return home with daughter when improved  Status is: Inpatient  Remains inpatient appropriate because:IV treatments appropriate due to intensity of illness or inability to take PO and Inpatient level of care appropriate due to severity of illness   Dispo: The patient is from: Home              Anticipated d/c is to: Home              Anticipated d/c date is: 2 days              Patient currently is not medically stable to d/c.   Difficult to place patient No      Medications:  Scheduled: Marland Kitchen. AeroChamber Plus Flo-Vu Medium  1 each Other Once  . amLODipine  10 mg Oral Daily  . enoxaparin (LOVENOX) injection  30 mg Subcutaneous Q24H  . famotidine  20 mg Oral BID  . hydrochlorothiazide  12.5 mg Oral Daily  . isosorbide mononitrate  30 mg Oral Daily  . lisinopril  20 mg Oral Daily  . methylPREDNISolone (SOLU-MEDROL) injection  40 mg Intravenous Q12H  . mometasone-formoterol  2 puff Inhalation BID  . sodium chloride flush  3 mL Intravenous Q12H   Continuous: . sodium chloride 10 mL/hr at 02/14/20 0246  . sodium chloride    . remdesivir 100 mg in NS 100 mL 100 mg (02/15/20 0840)   OZH:YQMVHQPRN:sodium chloride, sodium chloride flush   Objective:  Vital Signs  Vitals:   02/14/20 1931 02/14/20 2055 02/15/20 0036 02/15/20 0501  BP:  128/72 129/74 118/66  Pulse:  67 (!) 55 (!) 59  Resp:  16 16 16   Temp: 98 F (36.7 C) 98 F (36.7 C) 98 F (36.7 C) 98 F (36.7 C)  TempSrc: Oral Oral Oral Oral  SpO2:   99% 99%  Weight:  48.4 kg     Height:  5\' 5"  (1.651 m)     No intake or output data in the 24 hours ending 02/15/20 0952 Filed Weights   02/13/20 1504 02/14/20 2055  Weight: 47.6 kg 48.4 kg    General appearance: Awake alert.  In no distress.  Remains pleasantly confused Resp: Normal effort at rest today.  Diminished air entry at the bases.  No wheezing appreciated.  Few crackles. Cardio: S1-S2 is normal regular.  No S3-S4.  No rubs murmurs or bruit GI: Abdomen is soft.  Nontender nondistended.  Bowel sounds are present normal.  No masses organomegaly Extremities: No edema.  Full range of motion of lower extremities. Neurologic: No focal neurological deficits.     Lab Results:  Data Reviewed: I have personally  reviewed following labs and imaging studies  CBC: Recent Labs  Lab 02/13/20 1606 02/15/20 0201  WBC 6.2 8.8  NEUTROABS 4.7  --   HGB 13.7 11.6*  HCT 44.5 36.8  MCV 93.5 91.5  PLT 287 273    Basic Metabolic Panel: Recent Labs  Lab 02/13/20 1606 02/14/20 0350 02/15/20 0201  NA 139 138 138  K 3.5 4.4 4.4  CL 96* 100 99  CO2 35* 31 31  GLUCOSE 92 145* 122*  BUN 20 20 23   CREATININE 0.97 0.92 0.84  CALCIUM 8.9 8.4* 8.7*    GFR: Estimated Creatinine Clearance: 41.5 mL/min (by C-G formula based on SCr of 0.84 mg/dL).   Recent Results (from the past 240 hour(s))  SARS CORONAVIRUS 2 (TAT 6-24 HRS) Nasopharyngeal Nasopharyngeal Swab     Status: Abnormal   Collection Time: 02/13/20  5:40 PM   Specimen: Nasopharyngeal Swab  Result Value Ref Range Status   SARS Coronavirus 2 POSITIVE (A) NEGATIVE Final    Comment: (NOTE) SARS-CoV-2 target nucleic acids are DETECTED.  The SARS-CoV-2 RNA is generally detectable in upper and lower respiratory specimens during the acute phase of infection. Positive results are indicative of the presence of SARS-CoV-2 RNA. Clinical correlation with patient history and other diagnostic information is  necessary to determine patient infection status.  Positive results do not rule out bacterial infection or co-infection with other viruses.  The expected result is Negative.  Fact Sheet for Patients: 02/15/20  Fact Sheet for Healthcare Providers: HairSlick.no  This test is not yet approved or cleared by the quierodirigir.com FDA and  has been authorized for detection and/or diagnosis of SARS-CoV-2 by FDA under an Emergency Use Authorization (EUA). This EUA will remain  in effect (meaning this test can be used) for the duration of the COVID-19 declaration under Section 564(b)(1) of the Act, 21 U. S.C. section 360bbb-3(b)(1), unless the authorization is terminated or revoked sooner.   Performed at [facility], [address] [place], [place]       Radiology Studies: CT Angio Chest PE W/Cm &/Or Wo Cm  Result Date: 02/13/2020 CLINICAL DATA:  Shortness of breath EXAM: CT ANGIOGRAPHY CHEST WITH CONTRAST TECHNIQUE: Multidetector CT imaging of the chest was performed using the standard protocol during bolus administration of intravenous contrast. Multiplanar CT image reconstructions and MIPs were obtained to evaluate the vascular anatomy. CONTRAST:  58mL OMNIPAQUE IOHEXOL 350 MG/ML SOLN COMPARISON:  Chest x-ray from earlier in the same day. FINDINGS: Cardiovascular: Thoracic aorta demonstrates atherosclerotic calcification without aneurysmal dilatation. The pulmonary artery shows a normal branching pattern bilaterally. No filling defect to suggest pulmonary embolism is noted. Cardiac enlargement is seen. The coronary arteries demonstrate scattered atherosclerotic change. Mediastinum/Nodes: Thoracic inlet is within normal limits. No sizable hilar or mediastinal adenopathy is noted. The esophagus as visualized is within normal limits. Lungs/Pleura: Lungs demonstrate diffuse emphysematous change with multiple calcified pleural plaques and calcified granulomas bilaterally. No  focal infiltrate or sizable effusion is seen. Upper Abdomen: Visualized upper abdomen shows no acute abnormality. Musculoskeletal: Degenerative changes of the thoracic spine. No acute bony abnormality is noted. Review of the MIP images confirms the above findings. 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 No acute infiltrate is seen. No evidence of pulmonary emboli. Aortic Atherosclerosis (ICD10-I70.0) and Emphysema (ICD10-J43.9). Electronically Signed   By: 79m M.D.   On: 02/13/2020 17:39   DG Chest [facility]  Result Date: 02/13/2020 CLINICAL DATA:  Short of breath for 1 week, altered level of consciousness EXAM: PORTABLE CHEST 1 VIEW COMPARISON:  None. FINDINGS: Single frontal view of the chest demonstrates an enlarged cardiac silhouette. There are numerous calcified nodules and calcified pleural plaques seen bilaterally. No acute airspace disease, effusion, or pneumothorax. No acute bony abnormality. IMPRESSION: 1. Numerous calcified nodules and bilateral pleural plaques. Comparison with any prior studies would be useful. Findings could reflect previous granulomatous disease and/or asbestos exposure. 2. No acute airspace disease. Electronically Signed   By: Sharlet Salina M.D.   On: 02/13/2020 15:40       LOS: 2 days   Zuleika Gallus Rito Ehrlich  [facility] Pager on [email or web address]  02/15/2020, 9:52 AM

## 2020-02-15 NOTE — Evaluation (Signed)
Physical Therapy Evaluation Patient Details Name: Patricia Terrell MRN: [id number] DOB: [birth year] Today's Date: 02/15/2020   History of Present Illness  80 y.o. female with medical history significant of COPD, HTN, Liver cyst, rhematic fever, PUD has had multiple ED visits for COPD flares: most recently Aug '21, Oct /21.  Patient was recently brought here from [place] to be with her daughter.  She was seen at Urgent Care for Shortness of breath - O2 sat 70% on room air. She was placed on oxygenat 3 L and referred to [facility]. Has had two covid vaccinations.   She was hospitalized for further management.  She tested positive for COVID-19 and admitted 02/13/20 for treatment of Acute Hypoxic Resp. Failure/COVID-19 infection Pt confused, sometimes agitated and with decreased command follow.  Clinical Impression  Pt is very poor historian and gives information about prior home stating she lives with her husband. Pt is limited in her safe mobility by her decreased cognition especially in terms of safety awareness and command follow. Pt is min guard for mobility, but is unsteady with ambulation, transfers and bed mobility. Pt found to be walking around room with bed alarm sounding on entry, supplemental O2 removed and SaO2 82%O2. Pt requires 24 hour care either from her family or if not in a SNF level memory care facility. PT will continue to follow acutely.     Follow Up Recommendations SNF;Supervision/Assistance - 24 hour    Equipment Recommendations  None recommended by PT    Recommendations for Other Services OT consult     Precautions / Restrictions Precautions Precautions: Fall Precaution Comments: unsteady on her feet, tripped over her feet trying to get back in bed Restrictions Weight Bearing Restrictions: No      Mobility  Bed Mobility Overal bed mobility: Needs Assistance Bed Mobility: Supine to Sit     Supine to sit: Min guard     General bed mobility comments: min guard for  safety able to get into bed head first and then turn around with out assist    Transfers Overall transfer level: Needs assistance   Transfers: Sit to/from Stand Sit to Stand: Min guard         General transfer comment: trips as she tries to enter bed with R LE first, min guard for safety, does not follow any commands  Ambulation/Gait Ambulation/Gait assistance: Min guard Gait Distance (Feet): 15 Feet Assistive device: None Gait Pattern/deviations: Step-through pattern;Decreased step length - right;Decreased step length - left;Shuffle Gait velocity: slowed Gait velocity interpretation: <1.31 ft/sec, indicative of household ambulator General Gait Details: min guard for safety with unsteady gait in room to get back to bed     Balance Overall balance assessment: Needs assistance Sitting-balance support: Feet supported;No upper extremity supported Sitting balance-Leahy Scale: Fair     Standing balance support: No upper extremity supported;During functional activity Standing balance-Leahy Scale: Fair                               Pertinent Vitals/Pain Pain Assessment: No/denies pain    Home Living Family/patient expects to be discharged to:: Private residence Living Arrangements: Children               Additional Comments: unsure of home set up, reports that she lives with her husband, later states that her husband has passed away, says that her youngest son helps her with transportation otherwise she is independent, however per RN she lives with  her daughter    Prior Function Level of Independence: Needs assistance   Gait / Transfers Assistance Needed: ambulates with cane  ADL's / Homemaking Assistance Needed: independent in bathing and dressing, youngest son provides iADLs  Comments: pt poor historian,     Hand Dominance        Extremity/Trunk Assessment   Upper Extremity Assessment Upper Extremity Assessment: Generalized weakness;Difficult to  assess due to impaired cognition    Lower Extremity Assessment Lower Extremity Assessment: Generalized weakness;Difficult to assess due to impaired cognition       Communication   Communication: No difficulties  Cognition Arousal/Alertness: Awake/alert Behavior During Therapy: Impulsive;Agitated;Restless Overall Cognitive Status: History of cognitive impairments - at baseline Area of Impairment: Orientation;Memory;Following commands;Attention;Safety/judgement;Awareness;Problem solving                 Orientation Level: Disoriented to;Time;Situation;Place Current Attention Level: Selective Memory: Decreased short-term memory Following Commands: Follows one step commands inconsistently;Follows multi-step commands inconsistently Safety/Judgement: Decreased awareness of safety;Decreased awareness of deficits Awareness: Emergent Problem Solving: Slow processing;Difficulty sequencing;Requires verbal cues;Requires tactile cues General Comments: found up walking around in her room with bed alarm sounding and [place] removed.      General Comments General comments (skin integrity, edema, etc.): Pt with [place] out  on entry with SaO2 82%O2, replaced  with return to bed and instructed in purse lip breathing, SaO2 rebounded to 90%O2        Assessment/Plan    PT Assessment Patient needs continued PT services  PT Problem List Decreased balance;Decreased mobility;Decreased coordination;Decreased cognition;Decreased safety awareness;Cardiopulmonary status limiting activity       PT Treatment Interventions DME instruction;Gait training;Functional mobility training;Therapeutic activities;Therapeutic exercise;Balance training;Cognitive remediation;Patient/family education    PT Goals (Current goals can be found in the Care Plan section)  Acute Rehab PT Goals Patient Stated Goal: go home PT Goal Formulation: Patient unable to participate in goal setting Time For Goal Achievement:  02/29/20 Potential to Achieve Goals: Fair    Frequency Min 3X/week    AM-PAC PT "6 Clicks" Mobility  Outcome Measure Help needed turning from your back to your side while in a flat bed without using bedrails?: None Help needed moving from lying on your back to sitting on the side of a flat bed without using bedrails?: None Help needed moving to and from a bed to a chair (including a wheelchair)?: None Help needed standing up from a chair using your arms (e.g., wheelchair or bedside chair)?: None Help needed to walk in hospital room?: A Little Help needed climbing 3-5 steps with a railing? : A Little 6 Click Score: 22    End of Session Equipment Utilized During Treatment: Oxygen Activity Tolerance: Treatment limited secondary to agitation Patient left: in bed;with call bell/phone within reach;with bed alarm set;with nursing/sitter in room Nurse Communication: Mobility status;Other (comment) (RN reports pt given Haldol earlier and that she was sleeping last she had seen her, however since she was up walking around in room RN indicated not working well as pt is a fall risk when she gets up unassisted) PT Visit Diagnosis: Other abnormalities of gait and mobility (R26.89);Muscle weakness (generalized) (M62.81);Unsteadiness on feet (R26.81)    Time: 3785-8850 PT Time Calculation (min) (ACUTE ONLY): 15 min   Charges:   PT Evaluation $PT Eval Moderate Complexity: 1 Mod          Bexton Haak B. Beverely Risen PT, DPT Acute Rehabilitation Services Pager [phone] Office [phone]   [facility] 02/15/2020, 4:48 PM

## 2020-02-16 LAB — COMPREHENSIVE METABOLIC PANEL
ALT: 11 U/L (ref 0–44)
AST: 15 U/L (ref 15–41)
Albumin: 2.2 g/dL — ABNORMAL LOW (ref 3.5–5.0)
Alkaline Phosphatase: 37 U/L — ABNORMAL LOW (ref 38–126)
Anion gap: 5 (ref 5–15)
BUN: 27 mg/dL — ABNORMAL HIGH (ref 8–23)
CO2: 32 mmol/L (ref 22–32)
Calcium: 8.6 mg/dL — ABNORMAL LOW (ref 8.9–10.3)
Chloride: 102 mmol/L (ref 98–111)
Creatinine, Ser: 0.9 mg/dL (ref 0.44–1.00)
GFR, Estimated: >60 mL/min (ref >60)
Glucose, Bld: 104 mg/dL — ABNORMAL HIGH (ref 70–99)
Potassium: 4.2 mmol/L (ref 3.5–5.1)
Sodium: 139 mmol/L (ref 135–145)
Total Bilirubin: 0.4 mg/dL (ref 0.3–1.2)
Total Protein: 5.8 g/dL — ABNORMAL LOW (ref 6.5–8.1)

## 2020-02-16 LAB — CBC
HCT: 36 % (ref 36.0–46.0)
Hemoglobin: 11.5 g/dL — ABNORMAL LOW (ref 12.0–15.0)
MCH: 29.3 pg (ref 26.0–34.0)
MCHC: 31.9 g/dL (ref 30.0–36.0)
MCV: 91.8 fL (ref 80.0–100.0)
Platelets: 290 10*3/uL (ref 150–400)
RBC: 3.92 MIL/uL (ref 3.87–5.11)
RDW: 15.8 % — ABNORMAL HIGH (ref 11.5–15.5)
WBC: 11.2 10*3/uL — ABNORMAL HIGH (ref 4.0–10.5)
nRBC: 0 % (ref 0.0–0.2)

## 2020-02-16 LAB — D-DIMER, QUANTITATIVE: D-Dimer, Quant: 0.9 ug/mL-FEU — ABNORMAL HIGH (ref 0.00–0.50)

## 2020-02-16 LAB — C-REACTIVE PROTEIN: CRP: 1.2 mg/dL — ABNORMAL HIGH (ref <1.0)

## 2020-02-16 MED ORDER — PREDNISONE 20 MG PO TABS: ORAL_TABLET | ORAL | 0 refills | Status: DC

## 2020-02-16 MED ORDER — QUETIAPINE FUMARATE 25 MG PO TABS: 25.0000 mg | ORAL_TABLET | Freq: Every day | ORAL | 0 refills | Status: DC

## 2020-02-16 MED ORDER — MOMETASONE FURO-FORMOTEROL FUM 200-5 MCG/ACT IN AERO: 2.0000 | INHALATION_SPRAY | Freq: Two times a day (BID) | RESPIRATORY_TRACT | Status: AC

## 2020-02-16 MED ORDER — QUETIAPINE FUMARATE 25 MG PO TABS: 25.0000 mg | ORAL_TABLET | Freq: Every day | ORAL | 0 refills | Status: AC

## 2020-02-16 MED ORDER — LISINOPRIL 10 MG PO TABS: 10.0000 mg | ORAL_TABLET | Freq: Every day | ORAL | 1 refills | Status: AC

## 2020-02-16 MED ORDER — FAMOTIDINE 20 MG PO TABS: 20.0000 mg | ORAL_TABLET | Freq: Every day | ORAL | 0 refills | Status: AC

## 2020-02-16 MED ORDER — LISINOPRIL 10 MG PO TABS: 10.0000 mg | ORAL_TABLET | Freq: Every day | ORAL | 1 refills | Status: DC

## 2020-02-16 MED ORDER — FAMOTIDINE 20 MG PO TABS: 20.0000 mg | ORAL_TABLET | Freq: Every day | ORAL | 0 refills | Status: DC

## 2020-02-16 NOTE — Progress Notes (Signed)
Occupational Therapy Evaluation  Pt will need 24/7 S to DC home. Discussed safety concerns with daughter including fall risk, pt removing needed O2 to maintain SpO2 greater than 90, leaving on water/stove if left unattended and leaving apt and getting lost. Daughter verbalized understanding of need for 24/7 S. Recommend HHOT at DC.     02/16/20 1700  OT Visit Information  Last OT Received On 02/16/20  Assistance Needed +1  History of Present Illness 80 y.o. female with medical history significant of COPD, HTN, Liver cyst, rhematic fever, PUD has had multiple ED visits for COPD flares: most recently Aug '21, Oct /21.  Patient was recently brought here from [place] to be with her daughter.  She was seen at Urgent Care for Shortness of breath - O2 sat 70% on room air. She was placed on oxygenat 3 L and referred to [facility]. Has had two covid vaccinations.   She was hospitalized for further management.  She tested positive for COVID-19 and admitted 02/13/20 for treatment of Acute Hypoxic Resp. Failure/COVID-19 infection Pt confused, sometimes agitated and with decreased command follow.  Precautions  Precautions Fall  Home Living  Family/patient expects to be discharged to: Private residence  Living Arrangements Children  Available Help at Discharge Family  Type of [place] One level  Bathroom Biomedical scientist Yes  How Accessible Accessible via walker  Prior Function  Level of Independence Needs assistance  ADL's / [place] independent in bathing and dressing, youngest son provides iADLs  Comments Recently moved to [place] to live with her daughter from [place] No difficulties  Pain Assessment  Pain Assessment No/denies pain  Cognition  Arousal/Alertness Awake/alert  Behavior During Therapy Impulsive;Agitated;Restless  Overall Cognitive Status History of cognitive  impairments - at baseline  Area of Impairment Orientation;Memory;Following commands;Attention;Safety/judgement;Awareness;Problem solving  Orientation Level Disoriented to;Time;Situation;Place  Current Attention Level Selective  Memory Decreased short-term memory  Following Commands Follows one step commands inconsistently;Follows multi-step commands inconsistently  Safety/Judgement Decreased awareness of safety;Decreased awareness of deficits  Awareness Emergent  Problem Solving Slow processing;Difficulty sequencing;Requires verbal cues;Requires tactile cues  General Comments Daughter confirmed that her mom's cognition is closer to bbaseline. apparently more consfused due to being in the hospital  Upper Extremity Assessment  Upper Extremity Assessment Generalized weakness  Lower Extremity Assessment  Lower Extremity Assessment Defer to PT evaluation  Cervical / Trunk Assessment  Cervical / Trunk Assessment Normal  ADL  General ADL Comments requires min guard A for all ADL tasks  Bed Mobility  General bed mobility comments OOB in chair  Transfers  Overall transfer level Needs assistance  Transfers Sit to/from Stand  Sit to Stand Min guard  Balance  Overall balance assessment Needs assistance  Sitting-balance support Feet supported;No upper extremity supported  Sitting balance-Leahy Scale Good  Standing balance support No upper extremity supported;During functional activity  Standing balance-Leahy Scale Fair  General Comments  General comments (skin integrity, edema, etc.) Pt asked to put in her [place]. Pt initally placed [place] in her nose, however removed it and tried ot place the O2 finger probe onto her nose  OT - End of Session  Equipment Utilized During Treatment Oxygen;Gait belt (2L)  Activity Tolerance Patient tolerated treatment well  Patient left in chair;with call bell/phone within reach;with family/visitor present  Nurse Communication Mobility status;Other (comment) (DC needs)   OT Assessment  OT Recommendation/Assessment All further OT needs can be  met in the next venue of care  OT Visit Diagnosis Unsteadiness on feet (R26.81);Other abnormalities of gait and mobility (R26.89);Muscle weakness (generalized) (M62.81);Other symptoms and signs involving cognitive function  OT Problem List Decreased strength;Impaired balance (sitting and/or standing);Decreased cognition;Decreased safety awareness;Decreased knowledge of use of DME or AE;Cardiopulmonary status limiting activity  AM-PAC OT "6 Clicks" Daily Activity Outcome Measure (Version 2)  Help from another person eating meals? 3  Help from another person taking care of personal grooming? 3  Help from another person toileting, which includes using toliet, bedpan, or urinal? 3  Help from another person bathing (including washing, rinsing, drying)? 3  Help from another person to put on and taking off regular upper body clothing? 3  Help from another person to put on and taking off regular lower body clothing? 3  6 Click Score 18  OT Recommendation  Follow Up Recommendations Home health OT;Supervision/Assistance - 24 hour  OT Equipment 3 in 1 bedside commode  Individuals Consulted  Consulted and Agree with Results and Recommendations Patient;Family member/caregiver  Family Member Consulted daughter Patricia Terrell)  Acute Rehab OT Goals  Patient Stated Goal go home  OT Goal Formulation All assessment and education complete, DC therapy (to continue with HHOT)  OT Time Calculation  OT Start Time (ACUTE ONLY) 1215  OT Stop Time (ACUTE ONLY) 1302  OT Time Calculation (min) 47 min  OT General Charges  $OT Visit 1 Visit  OT Evaluation  $OT Eval Moderate Complexity 1 Mod  OT Treatments  $Self Care/Home Management  23-37 mins  Written Expression  Dominant Hand Right  Patricia Terrell, OT/L   Acute OT Clinical Specialist [place] Pager [phone] Office [phone]

## 2020-02-16 NOTE — Discharge Instructions (Signed)

## 2020-02-16 NOTE — Progress Notes (Signed)
SATURATION QUALIFICATIONS: (This note is used to comply with regulatory documentation for home oxygen)  Patient Saturations on Room Air at Rest = 96%  Patient Saturations on Room Air while Ambulating = 80%  Patient Saturations on 2 Liters of oxygen while Ambulating = 90%  Please briefly explain why patient needs home oxygen:

## 2020-02-16 NOTE — Discharge Summary (Signed)
[facility]  Physician Discharge Summary   Patient ID: Patricia Terrell MRN: [id number] DOB/AGE: [birth year] 80 y.o.  Admit date: 02/13/2020 Discharge date: 02/16/2020  PCP: Pcp, No  DISCHARGE DIAGNOSES:  Pneumonia due to COVID-19 Acute respiratory failure with hypoxia History of dementia COPD with acute exacerbation Essential hypertension  RECOMMENDATIONS FOR OUTPATIENT FOLLOW UP: 1. Consider outpatient echocardiogram for systolic murmur. 2. Home oxygen has been arranged 3. Outpatient follow-up with pulmonology for abnormal CT scan.  Ambulatory referral will be sent.    Home Health: PT and OT Equipment/Devices: Home O2  CODE STATUS:  DNR  DISCHARGE CONDITION: fair  Diet recommendation: As before  INITIAL HISTORY: 80 y.o.femalewith medical history significant ofCOPD, HTN, Liver cyst, rhematic fever, PUD has had multiple ED visits for COPD flares: most recently Aug '21, Oct /21.  Patient was recently brought here from [place] to be with her daughter.  She was seen at Urgent Care for Shortness of breath - O2 sat 70% on room air. She was placed on oxygenat 3 L and referred to [facility]. Has had two covid vaccinations.  She was hospitalized for further management.  She tested positive for COVID-19    HOSPITAL COURSE:   Acute Hypoxic Resp. Failure/COVID-19 infection Patient was hospitalized.  She was given a 3-day course of Remdesivir.  She was placed on steroids.  She was initially requiring 4 L of oxygen by nasal cannula.  Wean down to 2 L of oxygen.  Due to her dementia and some behavioral issues it has been very difficult to get accurate readings.  However she is not noted to be short of breath.  No increased work of breathing is noted.  Saturations noted to be mostly in the 90s.  Symptoms thought to be due to combination of COPD exacerbation and COVID-19.  CT angiogram did not show any PE.  Inflammatory markers were only minimally elevated.  She can come off of  isolation on 02/24/20.  COPD with acute exacerbation Patient was noted to be wheezing at the time of admission.  She was given nebulizer treatment and steroids with improvement.  Patient improved.  Will need home oxygen.  Continue with inhaled steroids. Continue with her inhaled steroids.  Unfortunately patient continues to smoke cigarettes.  She was counseled. CT angiogram did show multiple calcified pleural plaques and calcified granulomas.  Outpatient follow-up with pulmonology.  Essential hypertension Continue home medications.  Systolic murmur was appreciated on examination.  May need echocardiogram but can be pursued in the outpatient setting.  History of dementia Per daughter she has had memory problems for several years but has been getting worse.  She is pleasantly confused.  No neurological deficits.  Outpatient follow-up with PCP.  Not noted to be on any medications for dementia at home.    Overall stable.  Okay for discharge today.    We are trying to verify the daughter will be able to provide assistance and supervision at home.  If so she can go home.  If not she will need to go to skilled nursing facility.  Considering her dementia it would be preferable for the patient to go to familiar surroundings.   PERTINENT LABS:  The results of significant diagnostics from this hospitalization (including imaging, microbiology, ancillary and laboratory) are listed below for reference.    Microbiology: Recent Results (from the past 240 hour(s))  SARS CORONAVIRUS 2 (TAT 6-24 HRS) Nasopharyngeal Nasopharyngeal Swab     Status: Abnormal   Collection Time: 02/13/20  5:40 PM  Specimen: Nasopharyngeal Swab  Result Value Ref Range Status   SARS Coronavirus 2 POSITIVE (A) NEGATIVE Final    Comment: (NOTE) SARS-CoV-2 target nucleic acids are DETECTED.  The SARS-CoV-2 RNA is generally detectable in upper and lower respiratory specimens during the acute phase of infection.  Positive results are indicative of the presence of SARS-CoV-2 RNA. Clinical correlation with patient history and other diagnostic information is  necessary to determine patient infection status. Positive results do not rule out bacterial infection or co-infection with other viruses.  The expected result is Negative.  Fact Sheet for Patients: HairSlick.nohttps://www.fda.gov/media/138098/download  Fact Sheet for Healthcare Providers: quierodirigir.comhttps://www.fda.gov/media/138095/download  This test is not yet approved or cleared by the [place] FDA and  has been authorized for detection and/or diagnosis of SARS-CoV-2 by FDA under an Emergency Use Authorization (EUA). This EUA will remain  in effect (meaning this test can be used) for the duration of the COVID-19 declaration under Section 564(b)(1) of the Act, 21 U. S.C. section 360bbb-3(b)(1), unless the authorization is terminated or revoked sooner.   Performed at [facility], [address] [place], [place] [address]      Labs:  COVID-19 Labs  Recent Labs    02/13/20 1606 02/15/20 0201 02/16/20 0209  DDIMER 2.37* 1.23* 0.90*  CRP  --  3.5* 1.2*    Lab Results  Component Value Date   SARSCOV2NAA POSITIVE (A) 02/13/2020      Basic Metabolic Panel: Recent Labs  Lab 02/13/20 1606 02/14/20 0350 02/15/20 0201 02/16/20 0209  NA 139 138 138 139  K 3.5 4.4 4.4 4.2  CL 96* 100 99 102  CO2 35* 31 31 32  GLUCOSE 92 145* 122* 104*  BUN 20 20 23  27*  CREATININE 0.97 0.92 0.84 0.90  CALCIUM 8.9 8.4* 8.7* 8.6*   Liver Function Tests: Recent Labs  Lab 02/15/20 0201 02/16/20 0209  AST 15 15  ALT 11 11  ALKPHOS 43 37*  BILITOT 0.6 0.4  PROT 6.1* 5.8*  ALBUMIN 2.3* 2.2*   CBC: Recent Labs  Lab 02/13/20 1606 02/15/20 0201 02/16/20 0209  WBC 6.2 8.8 11.2*  NEUTROABS 4.7  --   --   HGB 13.7 11.6* 11.5*  HCT 44.5 36.8 36.0  MCV 93.5 91.5 91.8  PLT 287 273 290   BNP: BNP (last 3 results) Recent Labs     02/13/20 1606  BNP 148.4*      IMAGING STUDIES CT Angio Chest PE W/Cm &/Or Wo Cm  Result Date: 02/13/2020 CLINICAL DATA:  Shortness of breath EXAM: CT ANGIOGRAPHY CHEST WITH CONTRAST TECHNIQUE: Multidetector CT imaging of the chest was performed using the standard protocol during bolus administration of intravenous contrast. Multiplanar CT image reconstructions and MIPs were obtained to evaluate the vascular anatomy. CONTRAST:  70mL OMNIPAQUE IOHEXOL 350 MG/ML SOLN COMPARISON:  Chest x-ray from earlier in the same day. FINDINGS: Cardiovascular: Thoracic aorta demonstrates atherosclerotic calcification without aneurysmal dilatation. The pulmonary artery shows a normal branching pattern bilaterally. No filling defect to suggest pulmonary embolism is noted. Cardiac enlargement is seen. The coronary arteries demonstrate scattered atherosclerotic change. Mediastinum/Nodes: Thoracic inlet is within normal limits. No sizable hilar or mediastinal adenopathy is noted. The esophagus as visualized is within normal limits. Lungs/Pleura: Lungs demonstrate diffuse emphysematous change with multiple calcified pleural plaques and calcified granulomas bilaterally. No focal infiltrate or sizable effusion is seen. Upper Abdomen: Visualized upper abdomen shows no acute abnormality. Musculoskeletal: Degenerative changes of the thoracic spine. No acute bony abnormality is  noted. Review of the MIP images confirms the above findings. 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 No acute infiltrate is seen. No evidence of pulmonary emboli. Aortic Atherosclerosis (ICD10-I70.0) and Emphysema (ICD10-J43.9). Electronically Signed   By: Alcide Clever M.D.   On: 02/13/2020 17:39   DG Chest Port 1 View  Result Date: 02/13/2020 CLINICAL DATA:  Short of breath for 1 week, altered level of consciousness EXAM: PORTABLE CHEST 1 VIEW COMPARISON:  None.  FINDINGS: Single frontal view of the chest demonstrates an enlarged cardiac silhouette. There are numerous calcified nodules and calcified pleural plaques seen bilaterally. No acute airspace disease, effusion, or pneumothorax. No acute bony abnormality. IMPRESSION: 1. Numerous calcified nodules and bilateral pleural plaques. Comparison with any prior studies would be useful. Findings could reflect previous granulomatous disease and/or asbestos exposure. 2. No acute airspace disease. Electronically Signed   By: Sharlet Salina M.D.   On: 02/13/2020 15:40    DISCHARGE EXAMINATION: Vitals:   02/15/20 1651 02/15/20 1714 02/15/20 2125 02/16/20 0451  BP: (!) 115/59  140/69 (!) 146/67  Pulse: (!) 53  (!) 53 (!) 54  Resp: 19  18 16   Temp: 97.7 F (36.5 C)  97.6 F (36.4 C) 97.6 F (36.4 C)  TempSrc: Oral  Oral Oral  SpO2: 96% (!) 69% 97% 96%  Weight:    48 kg  Height:       General appearance: Awake alert.  In no distress.  Pleasantly confused Resp: Clear to auscultation bilaterally.  Normal effort Cardio: S1-S2 is normal regular.  No S3-S4.  No rubs murmurs or bruit GI: Abdomen is soft.  Nontender nondistended.  Bowel sounds are present normal.  No masses organomegaly    DISPOSITION: Home versus SNF  Discharge Instructions    Call MD for:  difficulty breathing, headache or visual disturbances   Complete by: As directed    Call MD for:  extreme fatigue   Complete by: As directed    Call MD for:  persistant dizziness or light-headedness   Complete by: As directed    Call MD for:  persistant nausea and vomiting   Complete by: As directed    Call MD for:  severe uncontrolled pain   Complete by: As directed    Call MD for:  temperature >100.4   Complete by: As directed    Diet - low sodium heart healthy   Complete by: As directed    Discharge instructions   Complete by: As directed    Please take your medications as prescribed.  Please establish with a primary care provider. You can  come off of your COVID-19 isolation on 02/23/2018   You were cared for by a hospitalist during your hospital stay. If you have any questions about your discharge medications or the care you received while you were in the hospital after you are discharged, you can call the unit and asked to speak with the hospitalist on call if the hospitalist that took care of you is not available. Once you are discharged, your primary care physician will handle any further medical issues. Please note that NO REFILLS for any discharge medications will be authorized once you are discharged, as it is imperative that you return to your primary care physician (or establish a relationship with a primary care physician if you do not have one) for your aftercare needs so that they can reassess your need for medications and monitor your lab  values. If you do not have a primary care physician, you can call [phone] for a physician referral.   Increase activity slowly   Complete by: As directed         Allergies as of 02/16/2020   No Known Allergies     Medication List    STOP taking these medications   losartan 50 MG tablet Commonly known as: COZAAR     TAKE these medications   amLODipine 5 MG tablet Commonly known as: NORVASC Take 5 mg by mouth daily.   famotidine 20 MG tablet Commonly known as: PEPCID Take 1 tablet (20 mg total) by mouth daily for 14 days.   hydrochlorothiazide 12.5 MG capsule Commonly known as: MICROZIDE Take 12.5 mg by mouth daily.   isosorbide mononitrate 30 MG 24 hr tablet Commonly known as: IMDUR Take 30 mg by mouth daily.   lisinopril 10 MG tablet Commonly known as: ZESTRIL Take 1 tablet (10 mg total) by mouth daily. What changed:   medication strength  how much to take   mometasone-formoterol 200-5 MCG/ACT Aero Commonly known as: DULERA Inhale 2 puffs into the lungs 2 (two) times daily.   predniSONE 20 MG tablet Commonly known as: DELTASONE Take 3 tablets once daily  for 3 days followed by 2 tablets once daily for 3 days followed by 1 tablet once daily for 3 days and then stop   QUEtiapine 25 MG tablet Commonly known as: SEROQUEL Take 1 tablet (25 mg total) by mouth at bedtime.            Durable Medical Equipment  (From admission, onward)         Start     Ordered   02/16/20 1310  For home use only DME 3 n 1  Once        02/16/20 1310   02/15/20 0958  For home use only DME oxygen  Once       Question Answer Comment  Length of Need 6 Months   Mode or (Route) Nasal cannula   Liters per Minute 2   Frequency Continuous (stationary and portable oxygen unit needed)   Oxygen conserving device Yes   Oxygen delivery system Gas      02/15/20 0957            Follow-up Information    [facility] Follow up on 02/26/2020.   Specialty: Family Medicine Why: Your appointment is at 11:00 am. Please arrive early and bring a photo ID, insurance card and current medications. Contact information: [address] [place] [address] [phone]              TOTAL DISCHARGE TIME: 35 minutes  Osric Klopf Rito Ehrlich  [facility] Pager on [email or web address]  02/16/2020, 1:57 PM

## 2020-02-16 NOTE — Progress Notes (Signed)
Called patients granddaughter Antionette Poles to let her know patient is ready for discharge. Patients paperwork and prescriptions are in a patient belongings bag and notified shakima that is where they would be when she picks patient up.

## 2020-02-16 NOTE — Progress Notes (Signed)
OT note   Pt seen for eval. Full note to follow. Pt will need 24/7 S to DC home safely due to generalized weakness and cognitive deficits. Daughter in room during eval and verbalized understanding. Difficult to achieve reliable pleth however pt appears to desat on RA to low 80s on RA. SpO2 remains above 88 on 2L with activity. Discussed with CM. Full note to follow.   Luisa Dago, OT/L   Acute OT Clinical Specialist Acute Rehabilitation Services Pager [phone] Office [phone]

## 2020-02-24 ENCOUNTER — Inpatient Hospital Stay (HOSPITAL_COMMUNITY)
Admission: EM | Admit: 2020-02-24 | Discharge: 2020-04-02 | DRG: 193 | Disposition: A | Discharge status: Home Health | Payer: Medicare HMO | Attending: Family Medicine | Admitting: Family Medicine

## 2020-02-24 ENCOUNTER — Encounter (HOSPITAL_COMMUNITY): Payer: Self-pay | Admitting: Emergency Medicine

## 2020-02-24 ENCOUNTER — Other Ambulatory Visit: Payer: Self-pay

## 2020-02-24 ENCOUNTER — Emergency Department (HOSPITAL_COMMUNITY): Payer: Medicare HMO

## 2020-02-24 DIAGNOSIS — J811 Chronic pulmonary edema: Secondary | ICD-10-CM | POA: Diagnosis present

## 2020-02-24 DIAGNOSIS — J9621 Acute and chronic respiratory failure with hypoxia: Secondary | ICD-10-CM | POA: Diagnosis present

## 2020-02-24 DIAGNOSIS — Z681 Body mass index (BMI) 19 or less, adult: Secondary | ICD-10-CM

## 2020-02-24 DIAGNOSIS — N183 Chronic kidney disease, stage 3 unspecified: Secondary | ICD-10-CM | POA: Diagnosis present

## 2020-02-24 DIAGNOSIS — R600 Localized edema: Secondary | ICD-10-CM | POA: Diagnosis present

## 2020-02-24 DIAGNOSIS — J9601 Acute respiratory failure with hypoxia: Secondary | ICD-10-CM | POA: Diagnosis not present

## 2020-02-24 DIAGNOSIS — R45850 Homicidal ideations: Secondary | ICD-10-CM | POA: Diagnosis present

## 2020-02-24 DIAGNOSIS — E43 Unspecified severe protein-calorie malnutrition: Secondary | ICD-10-CM | POA: Diagnosis present

## 2020-02-24 DIAGNOSIS — R001 Bradycardia, unspecified: Secondary | ICD-10-CM | POA: Diagnosis not present

## 2020-02-24 DIAGNOSIS — F17210 Nicotine dependence, cigarettes, uncomplicated: Secondary | ICD-10-CM | POA: Diagnosis present

## 2020-02-24 DIAGNOSIS — D638 Anemia in other chronic diseases classified elsewhere: Secondary | ICD-10-CM | POA: Diagnosis present

## 2020-02-24 DIAGNOSIS — J8410 Pulmonary fibrosis, unspecified: Secondary | ICD-10-CM | POA: Diagnosis not present

## 2020-02-24 DIAGNOSIS — F17200 Nicotine dependence, unspecified, uncomplicated: Secondary | ICD-10-CM | POA: Diagnosis present

## 2020-02-24 DIAGNOSIS — J449 Chronic obstructive pulmonary disease, unspecified: Secondary | ICD-10-CM | POA: Diagnosis present

## 2020-02-24 DIAGNOSIS — D631 Anemia in chronic kidney disease: Secondary | ICD-10-CM | POA: Diagnosis present

## 2020-02-24 DIAGNOSIS — Z66 Do not resuscitate: Secondary | ICD-10-CM | POA: Diagnosis present

## 2020-02-24 DIAGNOSIS — J159 Unspecified bacterial pneumonia: Secondary | ICD-10-CM | POA: Diagnosis present

## 2020-02-24 DIAGNOSIS — E8770 Fluid overload, unspecified: Secondary | ICD-10-CM | POA: Diagnosis not present

## 2020-02-24 DIAGNOSIS — I959 Hypotension, unspecified: Secondary | ICD-10-CM | POA: Diagnosis not present

## 2020-02-24 DIAGNOSIS — Z9114 Patient's other noncompliance with medication regimen: Secondary | ICD-10-CM

## 2020-02-24 DIAGNOSIS — R54 Age-related physical debility: Secondary | ICD-10-CM | POA: Diagnosis present

## 2020-02-24 DIAGNOSIS — Z9181 History of falling: Secondary | ICD-10-CM

## 2020-02-24 DIAGNOSIS — I471 Supraventricular tachycardia: Secondary | ICD-10-CM | POA: Diagnosis present

## 2020-02-24 DIAGNOSIS — I422 Other hypertrophic cardiomyopathy: Secondary | ICD-10-CM | POA: Diagnosis present

## 2020-02-24 DIAGNOSIS — Z8711 Personal history of peptic ulcer disease: Secondary | ICD-10-CM

## 2020-02-24 DIAGNOSIS — E875 Hyperkalemia: Secondary | ICD-10-CM | POA: Diagnosis not present

## 2020-02-24 DIAGNOSIS — I130 Hypertensive heart and chronic kidney disease with heart failure and stage 1 through stage 4 chronic kidney disease, or unspecified chronic kidney disease: Secondary | ICD-10-CM | POA: Diagnosis present

## 2020-02-24 DIAGNOSIS — I421 Obstructive hypertrophic cardiomyopathy: Secondary | ICD-10-CM | POA: Diagnosis present

## 2020-02-24 DIAGNOSIS — K7689 Other specified diseases of liver: Secondary | ICD-10-CM | POA: Diagnosis present

## 2020-02-24 DIAGNOSIS — J1282 Pneumonia due to coronavirus disease 2019: Secondary | ICD-10-CM | POA: Diagnosis not present

## 2020-02-24 DIAGNOSIS — J439 Emphysema, unspecified: Secondary | ICD-10-CM | POA: Diagnosis present

## 2020-02-24 DIAGNOSIS — U099 Post covid-19 condition, unspecified: Secondary | ICD-10-CM | POA: Diagnosis present

## 2020-02-24 DIAGNOSIS — U071 COVID-19: Secondary | ICD-10-CM

## 2020-02-24 DIAGNOSIS — R0602 Shortness of breath: Secondary | ICD-10-CM

## 2020-02-24 DIAGNOSIS — Z9981 Dependence on supplemental oxygen: Secondary | ICD-10-CM

## 2020-02-24 DIAGNOSIS — E874 Mixed disorder of acid-base balance: Secondary | ICD-10-CM | POA: Diagnosis not present

## 2020-02-24 DIAGNOSIS — K219 Gastro-esophageal reflux disease without esophagitis: Secondary | ICD-10-CM | POA: Diagnosis present

## 2020-02-24 DIAGNOSIS — I5032 Chronic diastolic (congestive) heart failure: Secondary | ICD-10-CM | POA: Diagnosis present

## 2020-02-24 DIAGNOSIS — R1310 Dysphagia, unspecified: Secondary | ICD-10-CM

## 2020-02-24 DIAGNOSIS — Z79899 Other long term (current) drug therapy: Secondary | ICD-10-CM

## 2020-02-24 DIAGNOSIS — I5033 Acute on chronic diastolic (congestive) heart failure: Secondary | ICD-10-CM | POA: Diagnosis present

## 2020-02-24 DIAGNOSIS — N1831 Chronic kidney disease, stage 3a: Secondary | ICD-10-CM | POA: Diagnosis present

## 2020-02-24 DIAGNOSIS — Z781 Physical restraint status: Secondary | ICD-10-CM

## 2020-02-24 DIAGNOSIS — E876 Hypokalemia: Secondary | ICD-10-CM | POA: Diagnosis present

## 2020-02-24 DIAGNOSIS — I10 Essential (primary) hypertension: Secondary | ICD-10-CM | POA: Diagnosis present

## 2020-02-24 DIAGNOSIS — G9341 Metabolic encephalopathy: Secondary | ICD-10-CM | POA: Diagnosis present

## 2020-02-24 DIAGNOSIS — F0391 Unspecified dementia with behavioral disturbance: Secondary | ICD-10-CM | POA: Diagnosis present

## 2020-02-24 DIAGNOSIS — I313 Pericardial effusion (noninflammatory): Secondary | ICD-10-CM | POA: Diagnosis not present

## 2020-02-24 DIAGNOSIS — J9622 Acute and chronic respiratory failure with hypercapnia: Secondary | ICD-10-CM | POA: Diagnosis present

## 2020-02-24 DIAGNOSIS — J9811 Atelectasis: Secondary | ICD-10-CM | POA: Diagnosis not present

## 2020-02-24 DIAGNOSIS — Z77090 Contact with and (suspected) exposure to asbestos: Secondary | ICD-10-CM | POA: Diagnosis present

## 2020-02-24 DIAGNOSIS — F03918 Unspecified dementia, unspecified severity, with other behavioral disturbance: Secondary | ICD-10-CM | POA: Diagnosis present

## 2020-02-24 LAB — CBC
HCT: 47.8 % — ABNORMAL HIGH (ref 36.0–46.0)
Hemoglobin: 14.4 g/dL (ref 12.0–15.0)
MCH: 28.5 pg (ref 26.0–34.0)
MCHC: 30.1 g/dL (ref 30.0–36.0)
MCV: 94.5 fL (ref 80.0–100.0)
Platelets: 271 10*3/uL (ref 150–400)
RBC: 5.06 MIL/uL (ref 3.87–5.11)
RDW: 16.6 % — ABNORMAL HIGH (ref 11.5–15.5)
WBC: 8.5 10*3/uL (ref 4.0–10.5)
nRBC: 0 % (ref 0.0–0.2)

## 2020-02-24 LAB — COMPREHENSIVE METABOLIC PANEL
ALT: 25 U/L (ref 0–44)
AST: 29 U/L (ref 15–41)
Albumin: 2.9 g/dL — ABNORMAL LOW (ref 3.5–5.0)
Alkaline Phosphatase: 50 U/L (ref 38–126)
Anion gap: 9 (ref 5–15)
BUN: 35 mg/dL — ABNORMAL HIGH (ref 8–23)
CO2: 32 mmol/L (ref 22–32)
Calcium: 8.9 mg/dL (ref 8.9–10.3)
Chloride: 101 mmol/L (ref 98–111)
Creatinine, Ser: 0.97 mg/dL (ref 0.44–1.00)
GFR, Estimated: 59 mL/min — ABNORMAL LOW (ref >60)
Glucose, Bld: 88 mg/dL (ref 70–99)
Potassium: 4 mmol/L (ref 3.5–5.1)
Sodium: 142 mmol/L (ref 135–145)
Total Bilirubin: 0.8 mg/dL (ref 0.3–1.2)
Total Protein: 6.4 g/dL — ABNORMAL LOW (ref 6.5–8.1)

## 2020-02-24 LAB — ETHANOL: Alcohol, Ethyl (B): <10 mg/dL (ref <10)

## 2020-02-24 LAB — RAPID URINE DRUG SCREEN, HOSP PERFORMED
Amphetamines: NOT DETECTED
Barbiturates: NOT DETECTED
Benzodiazepines: NOT DETECTED
Cocaine: NOT DETECTED
Opiates: NOT DETECTED
Tetrahydrocannabinol: NOT DETECTED

## 2020-02-24 LAB — SALICYLATE LEVEL: Salicylate Lvl: <7 mg/dL — ABNORMAL LOW (ref 7.0–30.0)

## 2020-02-24 LAB — ACETAMINOPHEN LEVEL: Acetaminophen (Tylenol), Serum: <10 ug/mL — ABNORMAL LOW (ref 10–30)

## 2020-02-24 NOTE — ED Notes (Signed)
Daughter Tora Duck number is [phone]

## 2020-02-24 NOTE — ED Notes (Signed)
Patient transported to X-ray 

## 2020-02-24 NOTE — ED Notes (Signed)
Pt transported to xray 
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
## 2020-02-25 ENCOUNTER — Emergency Department (HOSPITAL_COMMUNITY): Payer: Medicare HMO

## 2020-02-25 LAB — BRAIN NATRIURETIC PEPTIDE: B Natriuretic Peptide: 218 pg/mL — ABNORMAL HIGH (ref 0.0–100.0)

## 2020-02-25 LAB — URINALYSIS, ROUTINE W REFLEX MICROSCOPIC
Bilirubin Urine: NEGATIVE
Glucose, UA: NEGATIVE mg/dL
Hgb urine dipstick: NEGATIVE
Ketones, ur: NEGATIVE mg/dL
Leukocytes,Ua: NEGATIVE
Nitrite: NEGATIVE
Protein, ur: NEGATIVE mg/dL
Specific Gravity, Urine: 1.025 (ref 1.005–1.030)
pH: 6 (ref 5.0–8.0)

## 2020-02-25 MED ORDER — AMLODIPINE BESYLATE 5 MG PO TABS: 5.0000 mg | ORAL_TABLET | Freq: Every day | ORAL | Status: DC

## 2020-02-25 MED ORDER — ACETAMINOPHEN 325 MG PO TABS
650.0000 mg | ORAL_TABLET | ORAL | Status: DC | PRN
  Administered 2020-03-12 – 2020-04-01 (×6): 650 mg via ORAL
  Filled 2020-02-25 (×6): qty 2

## 2020-02-25 MED ORDER — LISINOPRIL 10 MG PO TABS
10.0000 mg | ORAL_TABLET | Freq: Every day | ORAL | Status: DC
  Administered 2020-02-25 – 2020-02-27 (×3): 10 mg via ORAL
  Filled 2020-02-25 (×3): qty 1

## 2020-02-25 MED ORDER — MOMETASONE FURO-FORMOTEROL FUM 200-5 MCG/ACT IN AERO
2.0000 | INHALATION_SPRAY | Freq: Two times a day (BID) | RESPIRATORY_TRACT | Status: DC
  Administered 2020-02-25 – 2020-04-02 (×67): 2 via RESPIRATORY_TRACT
  Filled 2020-02-25 (×2): qty 8.8

## 2020-02-25 MED ORDER — ALBUTEROL SULFATE HFA 108 (90 BASE) MCG/ACT IN AERS
2.0000 | INHALATION_SPRAY | RESPIRATORY_TRACT | Status: DC | PRN
  Administered 2020-03-02 – 2020-03-10 (×3): 2 via RESPIRATORY_TRACT
  Filled 2020-02-25: qty 6.7

## 2020-02-25 MED ORDER — AMLODIPINE BESYLATE 5 MG PO TABS
5.0000 mg | ORAL_TABLET | Freq: Every day | ORAL | Status: DC
  Administered 2020-02-25 – 2020-02-27 (×3): 5 mg via ORAL
  Filled 2020-02-25 (×3): qty 1

## 2020-02-25 MED ORDER — ISOSORBIDE MONONITRATE ER 30 MG PO TB24
30.0000 mg | ORAL_TABLET | Freq: Every day | ORAL | Status: DC
  Administered 2020-02-25 – 2020-02-27 (×3): 30 mg via ORAL
  Filled 2020-02-25 (×3): qty 1

## 2020-02-25 MED ORDER — HYDROCHLOROTHIAZIDE 12.5 MG PO CAPS
12.5000 mg | ORAL_CAPSULE | Freq: Every day | ORAL | Status: DC
  Administered 2020-02-25 – 2020-03-04 (×9): 12.5 mg via ORAL
  Filled 2020-02-25 (×9): qty 1

## 2020-02-25 MED ORDER — ALUM & MAG HYDROXIDE-SIMETH 200-200-20 MG/5ML PO SUSP: 30.0000 mL | Freq: Four times a day (QID) | ORAL | Status: DC | PRN

## 2020-02-25 MED ORDER — QUETIAPINE FUMARATE 25 MG PO TABS
25.0000 mg | ORAL_TABLET | Freq: Every day | ORAL | Status: DC
  Administered 2020-02-26 – 2020-03-14 (×19): 25 mg via ORAL
  Filled 2020-02-25 (×21): qty 1

## 2020-02-25 MED ORDER — FAMOTIDINE 20 MG PO TABS
20.0000 mg | ORAL_TABLET | Freq: Every day | ORAL | Status: DC
  Administered 2020-02-25 – 2020-04-02 (×38): 20 mg via ORAL
  Filled 2020-02-25 (×38): qty 1

## 2020-02-25 MED ORDER — NICOTINE 21 MG/24HR TD PT24
21.0000 mg | MEDICATED_PATCH | Freq: Every day | TRANSDERMAL | Status: DC
  Administered 2020-02-25 – 2020-04-02 (×38): 21 mg via TRANSDERMAL
  Filled 2020-02-25 (×41): qty 1

## 2020-02-25 NOTE — ED Notes (Signed)
Call placed to lab to see if they could add on u/a from sample provided earlier. Reports they will call back if they need another sample but there should be enough for u/a.

## 2020-02-25 NOTE — BH Assessment (Signed)
Per Nira Conn, NP [facility]iatric Psych Inpatient is recommended

## 2020-02-25 NOTE — Progress Notes (Signed)
CSW received a call from [facility] stating that they are going to declined Pt due to [facility]   Disposition CSW will continue to assist with seeking appropriate bed placement.    Jacinta Shoe, LCSW Clinical Social Worker - Disposition  [phone]
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
## 2020-02-25 NOTE — Progress Notes (Signed)
CSW received a call from [facility] and they are currently at capacity.    Disposition CSW will continue to assess for appropriate bed placement.    Jacinta Shoe, LCSW Clinical Social Worker - Disposition  [phone]

## 2020-02-25 NOTE — TOC Transition Note (Signed)
Transition of Care [facility]) - CM/SW Discharge Note   Patient Details  Name: Patricia Terrell MRN: [id number] Date of Birth: [birth year]  Transition of Care [facility]) CM/SW Contact:  Kermit Balo, RN Phone Number: 02/25/2020, 9:02 AM   Clinical Narrative:    Daughter decided she can take patient home. She and Granddaughter will be able to provide needed supervision. Due to her not having in states insurance she wont receive HH services. Oxygen ordered through [facility] and delivered to the unit.  Patients Granddaughter to provide transport home.   Final next level of care: Home/Self Care Barriers to Discharge: Inadequate or no insurance   Patient Goals and CMS Choice Patient states their goals for this hospitalization and ongoing recovery are:: to be determined based on PT OT evals CMS Medicare.gov Compare Post Acute Care list provided to:: Patient Represenative (must comment) Choice offered to / list presented to : Adult Children  Discharge Placement                       Discharge Plan and Services   Discharge Planning Services: CM Consult            DME Arranged: Oxygen DME Agency: [facility] Date DME Agency Contacted: 02/16/20                Social Determinants of Health (SDOH) Interventions     Readmission Risk Interventions No flowsheet data found.

## 2020-02-25 NOTE — Progress Notes (Signed)
Pt. meets criteria for inpatient treatment per Nira Conn NP.  Referred out to the following hospitals for review:      [facility]        [facility]        [facility]        [facility]        [facility]        [facility]        [facility]        [facility]        [facility]        [facility]   Disposition CSW will continue to assist in seeking appropriate bed placement.    Jacinta Shoe, LCSW Clinical Social Worker - Disposition  [phone]

## 2020-02-25 NOTE — ED Notes (Addendum)
While giving meds and obtaining vitals it was noted that pt's O2 was at 80% RA pt placed on 6L [place] and O2 went up to 100%. Placed pt on 3L [place] and pt O2 still at 100%. Pt A/O x4.

## 2020-02-25 NOTE — ED Notes (Signed)
Breakfast Ordered 

## 2020-02-25 NOTE — ED Provider Notes (Signed)
Emergency Medicine Observation Re-evaluation Note  Patricia Terrell is a 80 y.o. female, seen on rounds today.  Pt initially presented to the ED for complaints of Psychiatric Evaluation and Altered Mental Status Currently, the patient is awaiting placement for inpatient geri-psych.  Physical Exam  BP (!) 175/83 (BP Location: Left Arm)   Pulse (!) 58   Temp 97.6 F (36.4 C) (Oral)   Resp 17   LMP  (LMP Unknown)   SpO2 97%  Physical Exam General: Calm and cooperative.  Cardiac: Well perfused.  Lungs: Even, unlabored respirations.  Psych: Confused but cooperative.   ED Course / MDM  EKG:    I have reviewed the labs performed to date as well as medications administered while in observation.  Recent changes in the last 24 hours include SW attempting to find placement for patient.  Plan  Current plan is for inpatient geri psych. Patient is under full IVC at this time.   Maia Plan, MD 02/26/20 [phone]

## 2020-02-25 NOTE — ED Notes (Signed)
Belongings placed in locker #3.  

## 2020-02-26 ENCOUNTER — Observation Stay (HOSPITAL_COMMUNITY): Payer: Medicare HMO

## 2020-02-26 ENCOUNTER — Inpatient Hospital Stay: Payer: Medicare (Managed Care)

## 2020-02-26 ENCOUNTER — Emergency Department (HOSPITAL_COMMUNITY): Payer: Medicare HMO

## 2020-02-26 DIAGNOSIS — J441 Chronic obstructive pulmonary disease with (acute) exacerbation: Secondary | ICD-10-CM | POA: Diagnosis not present

## 2020-02-26 DIAGNOSIS — M7989 Other specified soft tissue disorders: Secondary | ICD-10-CM | POA: Diagnosis not present

## 2020-02-26 DIAGNOSIS — I313 Pericardial effusion (noninflammatory): Secondary | ICD-10-CM | POA: Diagnosis not present

## 2020-02-26 DIAGNOSIS — J449 Chronic obstructive pulmonary disease, unspecified: Secondary | ICD-10-CM

## 2020-02-26 DIAGNOSIS — J439 Emphysema, unspecified: Secondary | ICD-10-CM | POA: Diagnosis present

## 2020-02-26 DIAGNOSIS — J9811 Atelectasis: Secondary | ICD-10-CM | POA: Diagnosis not present

## 2020-02-26 DIAGNOSIS — Z66 Do not resuscitate: Secondary | ICD-10-CM | POA: Diagnosis not present

## 2020-02-26 DIAGNOSIS — I5031 Acute diastolic (congestive) heart failure: Secondary | ICD-10-CM | POA: Diagnosis not present

## 2020-02-26 DIAGNOSIS — J9621 Acute and chronic respiratory failure with hypoxia: Secondary | ICD-10-CM | POA: Diagnosis not present

## 2020-02-26 DIAGNOSIS — I471 Supraventricular tachycardia: Secondary | ICD-10-CM | POA: Diagnosis present

## 2020-02-26 DIAGNOSIS — I10 Essential (primary) hypertension: Secondary | ICD-10-CM

## 2020-02-26 DIAGNOSIS — J9602 Acute respiratory failure with hypercapnia: Secondary | ICD-10-CM | POA: Diagnosis not present

## 2020-02-26 DIAGNOSIS — J159 Unspecified bacterial pneumonia: Secondary | ICD-10-CM | POA: Diagnosis not present

## 2020-02-26 DIAGNOSIS — I421 Obstructive hypertrophic cardiomyopathy: Secondary | ICD-10-CM | POA: Diagnosis present

## 2020-02-26 DIAGNOSIS — N1831 Chronic kidney disease, stage 3a: Secondary | ICD-10-CM | POA: Diagnosis present

## 2020-02-26 DIAGNOSIS — E874 Mixed disorder of acid-base balance: Secondary | ICD-10-CM | POA: Diagnosis not present

## 2020-02-26 DIAGNOSIS — I422 Other hypertrophic cardiomyopathy: Secondary | ICD-10-CM | POA: Diagnosis not present

## 2020-02-26 DIAGNOSIS — F0281 Dementia in other diseases classified elsewhere with behavioral disturbance: Secondary | ICD-10-CM | POA: Diagnosis not present

## 2020-02-26 DIAGNOSIS — E876 Hypokalemia: Secondary | ICD-10-CM | POA: Diagnosis present

## 2020-02-26 DIAGNOSIS — I5032 Chronic diastolic (congestive) heart failure: Secondary | ICD-10-CM | POA: Diagnosis not present

## 2020-02-26 DIAGNOSIS — F0391 Unspecified dementia with behavioral disturbance: Secondary | ICD-10-CM

## 2020-02-26 DIAGNOSIS — U071 COVID-19: Secondary | ICD-10-CM

## 2020-02-26 DIAGNOSIS — E43 Unspecified severe protein-calorie malnutrition: Secondary | ICD-10-CM | POA: Diagnosis not present

## 2020-02-26 DIAGNOSIS — J1282 Pneumonia due to coronavirus disease 2019: Secondary | ICD-10-CM | POA: Diagnosis not present

## 2020-02-26 DIAGNOSIS — K219 Gastro-esophageal reflux disease without esophagitis: Secondary | ICD-10-CM | POA: Diagnosis present

## 2020-02-26 DIAGNOSIS — J9601 Acute respiratory failure with hypoxia: Secondary | ICD-10-CM | POA: Diagnosis not present

## 2020-02-26 DIAGNOSIS — I5033 Acute on chronic diastolic (congestive) heart failure: Secondary | ICD-10-CM | POA: Diagnosis not present

## 2020-02-26 DIAGNOSIS — J811 Chronic pulmonary edema: Secondary | ICD-10-CM | POA: Diagnosis present

## 2020-02-26 DIAGNOSIS — U099 Post covid-19 condition, unspecified: Secondary | ICD-10-CM | POA: Diagnosis present

## 2020-02-26 DIAGNOSIS — D631 Anemia in chronic kidney disease: Secondary | ICD-10-CM | POA: Diagnosis present

## 2020-02-26 DIAGNOSIS — G9341 Metabolic encephalopathy: Secondary | ICD-10-CM | POA: Diagnosis not present

## 2020-02-26 DIAGNOSIS — J9622 Acute and chronic respiratory failure with hypercapnia: Secondary | ICD-10-CM | POA: Diagnosis not present

## 2020-02-26 DIAGNOSIS — I130 Hypertensive heart and chronic kidney disease with heart failure and stage 1 through stage 4 chronic kidney disease, or unspecified chronic kidney disease: Secondary | ICD-10-CM | POA: Diagnosis not present

## 2020-02-26 DIAGNOSIS — Z681 Body mass index (BMI) 19 or less, adult: Secondary | ICD-10-CM | POA: Diagnosis not present

## 2020-02-26 LAB — CBC WITH DIFFERENTIAL/PLATELET
Abs Immature Granulocytes: 0.06 10*3/uL (ref 0.00–0.07)
Basophils Absolute: 0 10*3/uL (ref 0.0–0.1)
Basophils Relative: 0 %
Eosinophils Absolute: 0.1 10*3/uL (ref 0.0–0.5)
Eosinophils Relative: 1 %
HCT: 42.1 % (ref 36.0–46.0)
Hemoglobin: 12.8 g/dL (ref 12.0–15.0)
Immature Granulocytes: 1 %
Lymphocytes Relative: 9 %
Lymphs Abs: 0.8 10*3/uL (ref 0.7–4.0)
MCH: 28.5 pg (ref 26.0–34.0)
MCHC: 30.4 g/dL (ref 30.0–36.0)
MCV: 93.8 fL (ref 80.0–100.0)
Monocytes Absolute: 0.9 10*3/uL (ref 0.1–1.0)
Monocytes Relative: 11 %
Neutro Abs: 6.7 10*3/uL (ref 1.7–7.7)
Neutrophils Relative %: 78 %
Platelets: 244 10*3/uL (ref 150–400)
RBC: 4.49 MIL/uL (ref 3.87–5.11)
RDW: 16.3 % — ABNORMAL HIGH (ref 11.5–15.5)
WBC: 8.5 10*3/uL (ref 4.0–10.5)
nRBC: 0 % (ref 0.0–0.2)

## 2020-02-26 LAB — LACTATE DEHYDROGENASE: LDH: 192 U/L (ref 98–192)

## 2020-02-26 LAB — COMPREHENSIVE METABOLIC PANEL
ALT: 20 U/L (ref 0–44)
AST: 21 U/L (ref 15–41)
Albumin: 2.3 g/dL — ABNORMAL LOW (ref 3.5–5.0)
Alkaline Phosphatase: 43 U/L (ref 38–126)
Anion gap: 7 (ref 5–15)
BUN: 23 mg/dL (ref 8–23)
CO2: 35 mmol/L — ABNORMAL HIGH (ref 22–32)
Calcium: 8.4 mg/dL — ABNORMAL LOW (ref 8.9–10.3)
Chloride: 97 mmol/L — ABNORMAL LOW (ref 98–111)
Creatinine, Ser: 1.13 mg/dL — ABNORMAL HIGH (ref 0.44–1.00)
GFR, Estimated: 49 mL/min — ABNORMAL LOW (ref >60)
Glucose, Bld: 87 mg/dL (ref 70–99)
Potassium: 4 mmol/L (ref 3.5–5.1)
Sodium: 139 mmol/L (ref 135–145)
Total Bilirubin: 0.8 mg/dL (ref 0.3–1.2)
Total Protein: 5.2 g/dL — ABNORMAL LOW (ref 6.5–8.1)

## 2020-02-26 LAB — FIBRINOGEN: Fibrinogen: 506 mg/dL — ABNORMAL HIGH (ref 210–475)

## 2020-02-26 LAB — C-REACTIVE PROTEIN: CRP: 1.9 mg/dL — ABNORMAL HIGH (ref <1.0)

## 2020-02-26 LAB — PROCALCITONIN: Procalcitonin: <0.1 ng/mL

## 2020-02-26 LAB — LACTIC ACID, PLASMA: Lactic Acid, Venous: 1.2 mmol/L (ref 0.5–1.9)

## 2020-02-26 LAB — ECHOCARDIOGRAM LIMITED
Area-P 1/2: 2.87 cm2
S' Lateral: 1.7 cm

## 2020-02-26 LAB — FERRITIN: Ferritin: 52 ng/mL (ref 11–307)

## 2020-02-26 LAB — TROPONIN I (HIGH SENSITIVITY): Troponin I (High Sensitivity): 16 ng/L (ref <18)

## 2020-02-26 LAB — BRAIN NATRIURETIC PEPTIDE: B Natriuretic Peptide: 299.9 pg/mL — ABNORMAL HIGH (ref 0.0–100.0)

## 2020-02-26 LAB — D-DIMER, QUANTITATIVE: D-Dimer, Quant: 1.21 ug/mL-FEU — ABNORMAL HIGH (ref 0.00–0.50)

## 2020-02-26 LAB — TRIGLYCERIDES: Triglycerides: 45 mg/dL (ref <150)

## 2020-02-26 MED ORDER — ONDANSETRON HCL 4 MG PO TABS: 4.0000 mg | ORAL_TABLET | Freq: Four times a day (QID) | ORAL | Status: DC | PRN

## 2020-02-26 MED ORDER — ONDANSETRON HCL 4 MG/2ML IJ SOLN
4.0000 mg | Freq: Four times a day (QID) | INTRAMUSCULAR | Status: DC | PRN
  Administered 2020-03-12: 4 mg via INTRAVENOUS
  Filled 2020-02-26: qty 2

## 2020-02-26 MED ORDER — HALOPERIDOL LACTATE 5 MG/ML IJ SOLN
1.0000 mg | Freq: Once | INTRAMUSCULAR | Status: AC | PRN
  Administered 2020-02-27: 1 mg via INTRAVENOUS
  Filled 2020-02-26: qty 1

## 2020-02-26 MED ORDER — FUROSEMIDE 10 MG/ML IJ SOLN
40.0000 mg | Freq: Once | INTRAMUSCULAR | Status: AC
  Administered 2020-02-26: 40 mg via INTRAVENOUS
  Filled 2020-02-26: qty 4

## 2020-02-26 MED ORDER — ENOXAPARIN SODIUM 40 MG/0.4ML ~~LOC~~ SOLN
40.0000 mg | SUBCUTANEOUS | Status: DC
  Administered 2020-02-26 – 2020-02-27 (×2): 40 mg via SUBCUTANEOUS
  Filled 2020-02-26 (×2): qty 0.4

## 2020-02-26 MED ORDER — DEXAMETHASONE SODIUM PHOSPHATE 10 MG/ML IJ SOLN
6.0000 mg | Freq: Once | INTRAMUSCULAR | Status: AC
  Administered 2020-02-26: 6 mg via INTRAVENOUS
  Filled 2020-02-26: qty 1

## 2020-02-26 NOTE — Progress Notes (Signed)
  Echocardiogram 2D Echocardiogram has been performed.  Delcie Roch 02/26/2020, 10:24 AM

## 2020-02-26 NOTE — Progress Notes (Signed)
Labs back and reviewed.  BNP now elevated to 299 suggesting that acute on chronic diastolic CHF may be the cause of patients respiratory issues today.  Will order 40mg  IV lasix x1 as test dose to see how she responds.

## 2020-02-26 NOTE — BH Assessment (Addendum)
Per bed meeting report, patient to be medically admitted. Removed from TTS list.
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
## 2020-02-26 NOTE — ED Notes (Signed)
o2 sat 88 % on room air after returning from bathroom, placed patient on 6 L / [place] and sats are currently 99%

## 2020-02-26 NOTE — ED Provider Notes (Signed)
  Provider Note MRN:  [id number]  Arrival date & time: 02/26/20    ED Course and Medical Decision Making  Assumed care from default provider at shift change.  I was made aware the patient's breathing and hypoxia is worsening, satting in the 70s on room air.  Placed on nonrebreather and now appears more comfortable.  She has been a psych hold for dementia with aggressive behavior.  It appears that she has a history of CHF and COPD.  She does not appear floridly overloaded, does have some lower extremity edema.  Lungs with more crackles, no wheezes.  She tested positive for COVID-19 about 12 days ago.  And so considering CHF versus COPD versus COVID-19.  Will recheck labs, admit to medicine.  .Critical Care Performed by: Sabas Sous, MD Authorized by: Sabas Sous, MD   Critical care provider statement:    Critical care time (minutes):  32   Critical care was necessary to treat or prevent imminent or life-threatening deterioration of the following conditions:  Respiratory failure   Critical care was time spent personally by me on the following activities:  Discussions with consultants, evaluation of patient's response to treatment, examination of patient, ordering and performing treatments and interventions, ordering and review of laboratory studies, ordering and review of radiographic studies, pulse oximetry, re-evaluation of patient's condition, obtaining history from patient or surrogate and review of old charts   I assumed direction of critical care for this patient from another provider in my specialty: yes      Final Clinical Impressions(s) / ED Diagnoses     ICD-10-CM   1. Dementia with aggressive behavior (HCC)  F03.91   2. Acute respiratory failure with hypoxia (HCC)  J96.01   3. COVID-19  U07.1     ED Discharge Orders    None      Discharge Instructions   None     Elmer Sow. Pilar Plate, MD [facility] Emergency Medicine [facility]  Health [email or web address]    Sabas Sous, MD 02/26/20 [phone]

## 2020-02-26 NOTE — ED Notes (Signed)
Pt O2 dropped to 70 on 3L . This RN placed pt on 15L NRB and notified Bero, MD. Pt tolerating NRB well and O2 is at 100%.
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
## 2020-02-26 NOTE — ED Notes (Signed)
Assisted patient up to bathroom 

## 2020-02-27 DIAGNOSIS — I5032 Chronic diastolic (congestive) heart failure: Secondary | ICD-10-CM | POA: Diagnosis not present

## 2020-02-27 DIAGNOSIS — I422 Other hypertrophic cardiomyopathy: Secondary | ICD-10-CM

## 2020-02-27 DIAGNOSIS — J9601 Acute respiratory failure with hypoxia: Secondary | ICD-10-CM | POA: Diagnosis not present

## 2020-02-27 LAB — COMPREHENSIVE METABOLIC PANEL
ALT: 17 U/L (ref 0–44)
AST: 18 U/L (ref 15–41)
Albumin: 2.1 g/dL — ABNORMAL LOW (ref 3.5–5.0)
Alkaline Phosphatase: 38 U/L (ref 38–126)
Anion gap: 6 (ref 5–15)
BUN: 32 mg/dL — ABNORMAL HIGH (ref 8–23)
CO2: 36 mmol/L — ABNORMAL HIGH (ref 22–32)
Calcium: 8.4 mg/dL — ABNORMAL LOW (ref 8.9–10.3)
Chloride: 95 mmol/L — ABNORMAL LOW (ref 98–111)
Creatinine, Ser: 1.22 mg/dL — ABNORMAL HIGH (ref 0.44–1.00)
GFR, Estimated: 45 mL/min — ABNORMAL LOW (ref >60)
Glucose, Bld: 107 mg/dL — ABNORMAL HIGH (ref 70–99)
Potassium: 4.2 mmol/L (ref 3.5–5.1)
Sodium: 137 mmol/L (ref 135–145)
Total Bilirubin: 0.6 mg/dL (ref 0.3–1.2)
Total Protein: 4.9 g/dL — ABNORMAL LOW (ref 6.5–8.1)

## 2020-02-27 LAB — CBC
HCT: 34 % — ABNORMAL LOW (ref 36.0–46.0)
Hemoglobin: 11 g/dL — ABNORMAL LOW (ref 12.0–15.0)
MCH: 29.3 pg (ref 26.0–34.0)
MCHC: 32.4 g/dL (ref 30.0–36.0)
MCV: 90.7 fL (ref 80.0–100.0)
Platelets: 198 10*3/uL (ref 150–400)
RBC: 3.75 MIL/uL — ABNORMAL LOW (ref 3.87–5.11)
RDW: 16.2 % — ABNORMAL HIGH (ref 11.5–15.5)
WBC: 10.1 10*3/uL (ref 4.0–10.5)
nRBC: 0 % (ref 0.0–0.2)

## 2020-02-27 MED ORDER — ENOXAPARIN SODIUM 30 MG/0.3ML ~~LOC~~ SOLN
30.0000 mg | SUBCUTANEOUS | Status: DC
  Administered 2020-02-28 – 2020-04-02 (×35): 30 mg via SUBCUTANEOUS
  Filled 2020-02-27 (×35): qty 0.3

## 2020-02-27 MED ORDER — PREDNISONE 20 MG PO TABS
40.0000 mg | ORAL_TABLET | Freq: Every day | ORAL | Status: DC
  Administered 2020-02-27 – 2020-03-10 (×13): 40 mg via ORAL
  Filled 2020-02-27 (×13): qty 2

## 2020-02-27 MED ORDER — PROSOURCE PLUS PO LIQD
30.0000 mL | Freq: Two times a day (BID) | ORAL | Status: DC
  Administered 2020-02-28 – 2020-04-02 (×66): 30 mL via ORAL
  Filled 2020-02-27 (×60): qty 30

## 2020-02-27 MED ORDER — ENSURE ENLIVE PO LIQD
237.0000 mL | Freq: Three times a day (TID) | ORAL | Status: DC
  Administered 2020-02-27 – 2020-03-25 (×75): 237 mL via ORAL
  Filled 2020-02-27 (×4): qty 237

## 2020-02-27 MED ORDER — ADULT MULTIVITAMIN W/MINERALS CH
1.0000 | ORAL_TABLET | Freq: Every day | ORAL | Status: DC
  Administered 2020-02-27 – 2020-04-02 (×36): 1 via ORAL
  Filled 2020-02-27 (×35): qty 1

## 2020-02-27 MED ORDER — METOPROLOL TARTRATE 12.5 MG HALF TABLET
12.5000 mg | ORAL_TABLET | Freq: Two times a day (BID) | ORAL | Status: DC
  Administered 2020-02-27 – 2020-03-10 (×22): 12.5 mg via ORAL
  Filled 2020-02-27 (×25): qty 1

## 2020-02-27 NOTE — Progress Notes (Signed)
Ok to reduce Lovenox to 30mg  SQ qday due to wt (48kg) and Crcl<30 ml/min per Dr. .  Elvera Lennox, PharmD, BCIDP, AAHIVP, CPP Infectious Disease Pharmacist 02/27/2020 3:10 PM
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
## 2020-02-28 DIAGNOSIS — J9601 Acute respiratory failure with hypoxia: Secondary | ICD-10-CM | POA: Diagnosis not present

## 2020-02-28 LAB — COMPREHENSIVE METABOLIC PANEL
ALT: 19 U/L (ref 0–44)
AST: 19 U/L (ref 15–41)
Albumin: 2.2 g/dL — ABNORMAL LOW (ref 3.5–5.0)
Alkaline Phosphatase: 39 U/L (ref 38–126)
Anion gap: 6 (ref 5–15)
BUN: 31 mg/dL — ABNORMAL HIGH (ref 8–23)
CO2: 38 mmol/L — ABNORMAL HIGH (ref 22–32)
Calcium: 8.9 mg/dL (ref 8.9–10.3)
Chloride: 94 mmol/L — ABNORMAL LOW (ref 98–111)
Creatinine, Ser: 1.13 mg/dL — ABNORMAL HIGH (ref 0.44–1.00)
GFR, Estimated: 49 mL/min — ABNORMAL LOW (ref >60)
Glucose, Bld: 102 mg/dL — ABNORMAL HIGH (ref 70–99)
Potassium: 4.7 mmol/L (ref 3.5–5.1)
Sodium: 138 mmol/L (ref 135–145)
Total Bilirubin: 0.7 mg/dL (ref 0.3–1.2)
Total Protein: 5.2 g/dL — ABNORMAL LOW (ref 6.5–8.1)

## 2020-02-28 LAB — CBC
HCT: 38.1 % (ref 36.0–46.0)
Hemoglobin: 11.5 g/dL — ABNORMAL LOW (ref 12.0–15.0)
MCH: 28.1 pg (ref 26.0–34.0)
MCHC: 30.2 g/dL (ref 30.0–36.0)
MCV: 93.2 fL (ref 80.0–100.0)
Platelets: 219 10*3/uL (ref 150–400)
RBC: 4.09 MIL/uL (ref 3.87–5.11)
RDW: 16 % — ABNORMAL HIGH (ref 11.5–15.5)
WBC: 10.1 10*3/uL (ref 4.0–10.5)
nRBC: 0 % (ref 0.0–0.2)

## 2020-02-28 MED ORDER — LISINOPRIL 10 MG PO TABS
10.0000 mg | ORAL_TABLET | Freq: Every day | ORAL | Status: DC
  Administered 2020-02-28 – 2020-03-04 (×6): 10 mg via ORAL
  Filled 2020-02-28 (×6): qty 1

## 2020-02-28 NOTE — Social Work (Signed)
Patient IVC will expire 03/02/2019  Antony Blackbird, MSW, LCSW Clinical Social Worker
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
## 2020-02-29 DIAGNOSIS — J9601 Acute respiratory failure with hypoxia: Secondary | ICD-10-CM | POA: Diagnosis not present

## 2020-02-29 MED ORDER — FUROSEMIDE 10 MG/ML IJ SOLN
40.0000 mg | Freq: Once | INTRAMUSCULAR | Status: AC
  Administered 2020-02-29: 40 mg via INTRAVENOUS
  Filled 2020-02-29: qty 4

## 2020-02-29 NOTE — Progress Notes (Signed)
PROGRESS NOTE  Patricia Terrell ZOX:[id number] DOB: Oct 05, 1940 DOA: 02/24/2020 PCP: Pcp, No   LOS: 3 days   Brief Narrative / Interim history: 80 year old female with history of dementia, COPD/emphysema, hypertension. She was admitted 1/21-1/24 for COPD exacerbation, was found to be COVID-19 positive. A CT scan during that admission was negative for PE and also negative for multifocal pneumonia. She received 3 days of Remdesivir and was recommended to have 10 days of isolation which she completed. She was discharged home, however became more confused, belligerent, allegedly threatening to kill her daughter and she was IVC on 2/1 and brought back to the ED. On 2/3 she was noted to have respiratory distress, requiring nonrebreather. Chest x-ray showed patchy airspace opacities superimposed on chronic calcified pleural plaques. BNP was elevated and was thought to have a component of acute on chronic diastolic CHF and was given Lasix.  Subjective / 24h Interval events: No complaints, asking about discharge.  She is on 4 L  Assessment & Plan: Principal Problem Acute hypoxic respiratory failure, possibly post Covid pneumonitis versus acute on chronic diastolic CHF -Patient received Lasix on admission with some improvement in her respiratory status but she is still on 4 L nasal cannula -Due to concern for post Covid pneumonitis steroids were added, no more than 10 days -2D echo done showed severe concentric LVH consistent with hypertrophic cardiomyopathy but no significant obstruction at rest, LVEF 70-75% and hyperdynamic function.  It also showed grade 1 diastolic dysfunction.  Repeat Lasix due to swelling of the lower extremities  Active Problems Hypertrophic cardiomyopathy, severe concentric LVH, grade 1 diastolic dysfunction -Patient does not have a prior echo in the system. -Stop amlodipine, Imdur as this can worsen heart failure symptoms.  She seems to be tolerating beta-blockers.  Added back on  lisinopril low-dose given hypertension  Essential hypertension -Tolerating beta-blockers and lisinopril.  Her amlodipine and Imdur were discontinued due to her hypertrophic cardiomyopathy  Dementia, behavioral disturbances -Consulted psych as well as appreciate reevaluation and follow-up.  She was IVC by family due to homicidal ideation as she was threatening the daughter to kill her in her sleep -Remains confused but pleasant.  Psych consulted, needs Geri psych placement  Scheduled Meds: . (feeding supplement) PROSource Plus  30 mL Oral BID BM  . enoxaparin (LOVENOX) injection  30 mg Subcutaneous Q24H  . famotidine  20 mg Oral Daily  . feeding supplement  237 mL Oral TID BM  . hydrochlorothiazide  12.5 mg Oral Daily  . lisinopril  10 mg Oral Daily  . metoprolol tartrate  12.5 mg Oral BID  . mometasone-formoterol  2 puff Inhalation BID  . multivitamin with minerals  1 tablet Oral Daily  . nicotine  21 mg Transdermal Daily  . predniSONE  40 mg Oral Q breakfast  . QUEtiapine  25 mg Oral QHS   Continuous Infusions: PRN Meds:.acetaminophen, albuterol, alum & mag hydroxide-simeth, ondansetron **OR** ondansetron (ZOFRAN) IV  Diet Orders (From admission, onward)    Start     Ordered   02/26/20 0356  Diet Heart Room service appropriate? Yes; Fluid consistency: Thin  Diet effective now       Question Answer Comment  Room service appropriate? Yes   Fluid consistency: Thin      02/26/20 0358          DVT prophylaxis: Place TED hose Start: 02/29/20 0918 enoxaparin (LOVENOX) injection 30 mg Start: 02/28/20 1000     Code Status: DNR  Family Communication: daughter over the phone  Status is: Inpatient  Remains inpatient appropriate because:Inpatient level of care appropriate due to severity of illness   Dispo: The patient is from: Home              Anticipated d/c is to: SNF, Geri psych              Anticipated d/c date is: 3 days              Patient currently is not  medically stable to d/c.   Difficult to place patient Yes   Level of care: Telemetry Medical  Consultants:  None   Procedures:  2D echo:  1. Severe concentric LVH with cavity obliteration of mid cavity and apex with systole consistent with hypertrophic cardiomyopathy Turbulent flow through LV/LVOT but no significant obstruction at rest. . Left ventricular ejection fraction, by estimation, is 70 to 75%. The left ventricle has hyperdynamic function. The left ventricle has no regional wall motion abnormalities. Left ventricular diastolic parameters are consistent with Grade I diastolic dysfunction (impaired relaxation).  2. Right ventricular systolic function is normal. The right ventricular size is normal. There is normal pulmonary artery systolic pressure.  3. A small pericardial effusion is present.  4. The mitral valve is abnormal.  5. The inferior vena cava is normal in size with greater than 50%  respiratory variability, suggesting right atrial pressure of 3 mmHg.   Microbiology  None   Antimicrobials: None     Objective: Vitals:   02/28/20 0900 02/28/20 1500 02/28/20 2010 02/29/20 0410  BP:  109/78 124/73 136/67  Pulse:  61 76   Resp:  20 20 20   Temp:  98.1 F (36.7 C) 98.7 F (37.1 C) 97.6 F (36.4 C)  TempSrc:  Oral Oral Oral  SpO2: 95% 93% 94% 99%  Weight:      Height:        Intake/Output Summary (Last 24 hours) at 02/29/2020 1119 Last data filed at 02/29/2020 0845 Gross per 24 hour  Intake 480 ml  Output --  Net 480 ml   Filed Weights   02/26/20 1100  Weight: 48 kg    Examination:  Constitutional: No distress, pleasantly confused Eyes: No icterus ENMT: Moist membranes Neck: normal, supple Respiratory: Bibasilar rhonchi, no wheezing, very well Cardiovascular: Regular rate and rhythm, no murmurs, 1+ edema Abdomen: Soft, nontender, nondistended, bowel sounds positive Musculoskeletal: no clubbing / cyanosis.  Skin: No rashes seen Neurologic: Non  focal  Data Reviewed: I have independently reviewed following labs and imaging studies   CBC: Recent Labs  Lab 02/24/20 1536 02/26/20 0229 02/27/20 0232 02/28/20 0425  WBC 8.5 8.5 10.1 10.1  NEUTROABS  --  6.7  --   --   HGB 14.4 12.8 11.0* 11.5*  HCT 47.8* 42.1 34.0* 38.1  MCV 94.5 93.8 90.7 93.2  PLT 271 244 198 219   Basic Metabolic Panel: Recent Labs  Lab 02/24/20 1536 02/26/20 0229 02/27/20 0232 02/28/20 0425  NA 142 139 137 138  K 4.0 4.0 4.2 4.7  CL 101 97* 95* 94*  CO2 32 35* 36* 38*  GLUCOSE 88 87 107* 102*  BUN 35* 23 32* 31*  CREATININE 0.97 1.13* 1.22* 1.13*  CALCIUM 8.9 8.4* 8.4* 8.9   Liver Function Tests: Recent Labs  Lab 02/24/20 1536 02/26/20 0229 02/27/20 0232 02/28/20 0425  AST 29 21 18 19   ALT 25 20 17 19   ALKPHOS 50 43 38 39  BILITOT 0.8 0.8 0.6 0.7  PROT 6.4* 5.2* 4.9*  5.2*  ALBUMIN 2.9* 2.3* 2.1* 2.2*   Coagulation Profile: No results for input(s): INR, PROTIME in the last 168 hours. HbA1C: No results for input(s): HGBA1C in the last 72 hours. CBG: No results for input(s): GLUCAP in the last 168 hours.  Recent Results (from the past 240 hour(s))  Blood Culture (routine x 2)     Status: None (Preliminary result)   Collection Time: 02/26/20  3:00 AM   Specimen: BLOOD LEFT FOREARM  Result Value Ref Range Status   Specimen Description BLOOD LEFT FOREARM  Final   Special Requests   Final    BOTTLES DRAWN AEROBIC AND ANAEROBIC Blood Culture adequate volume   Culture   Final    NO GROWTH 2 DAYS Performed at [facility], [address] [place], [place] [address]    Report Status PENDING  Incomplete  Blood Culture (routine x 2)     Status: None (Preliminary result)   Collection Time: 02/26/20  3:20 AM   Specimen: BLOOD RIGHT FOREARM  Result Value Ref Range Status   Specimen Description BLOOD RIGHT FOREARM  Final   Special Requests   Final    BOTTLES DRAWN AEROBIC AND ANAEROBIC Blood Culture adequate volume   Culture    Final    NO GROWTH 2 DAYS Performed at [facility], [address], [place], [place] [address]    Report Status PENDING  Incomplete     Radiology Studies: No results found.  Pamella Pert, MD, PhD [facility]  Between 7 am - 7 pm I am available, please contact me via Amion or Securechat  Between 7 pm - 7 am I am not available, please contact night coverage MD/APP via Amion

## 2020-03-01 DIAGNOSIS — J9601 Acute respiratory failure with hypoxia: Secondary | ICD-10-CM | POA: Diagnosis not present

## 2020-03-01 LAB — CBC
HCT: 36.9 % (ref 36.0–46.0)
Hemoglobin: 11.9 g/dL — ABNORMAL LOW (ref 12.0–15.0)
MCH: 29.5 pg (ref 26.0–34.0)
MCHC: 32.2 g/dL (ref 30.0–36.0)
MCV: 91.6 fL (ref 80.0–100.0)
Platelets: 219 10*3/uL (ref 150–400)
RBC: 4.03 MIL/uL (ref 3.87–5.11)
RDW: 16.5 % — ABNORMAL HIGH (ref 11.5–15.5)
WBC: 7.6 10*3/uL (ref 4.0–10.5)
nRBC: 0 % (ref 0.0–0.2)

## 2020-03-01 LAB — BASIC METABOLIC PANEL
Anion gap: 11 (ref 5–15)
BUN: 40 mg/dL — ABNORMAL HIGH (ref 8–23)
CO2: 36 mmol/L — ABNORMAL HIGH (ref 22–32)
Calcium: 9.2 mg/dL (ref 8.9–10.3)
Chloride: 91 mmol/L — ABNORMAL LOW (ref 98–111)
Creatinine, Ser: 1.22 mg/dL — ABNORMAL HIGH (ref 0.44–1.00)
GFR, Estimated: 45 mL/min — ABNORMAL LOW (ref >60)
Glucose, Bld: 89 mg/dL (ref 70–99)
Potassium: 5.2 mmol/L — ABNORMAL HIGH (ref 3.5–5.1)
Sodium: 138 mmol/L (ref 135–145)

## 2020-03-01 LAB — MAGNESIUM: Magnesium: 1.9 mg/dL (ref 1.7–2.4)

## 2020-03-01 NOTE — Progress Notes (Signed)
PROGRESS NOTE  Patricia Terrell CLE:[id number] DOB: [birth year] DOA: 02/24/2020 PCP: Pcp, No   LOS: 4 days   Brief Narrative / Interim history: 80 year old female with history of dementia, COPD/emphysema, hypertension. She was admitted 1/21-1/24 for COPD exacerbation, was found to be COVID-19 positive. A CT scan during that admission was negative for PE and also negative for multifocal pneumonia. She received 3 days of Remdesivir and was recommended to have 10 days of isolation which she completed. She was discharged home, however became more confused, belligerent, allegedly threatening to kill her daughter and she was IVC on 2/1 and brought back to the ED. On 2/3 she was noted to have respiratory distress, requiring nonrebreather. Chest x-ray showed patchy airspace opacities superimposed on chronic calcified pleural plaques. BNP was elevated and was thought to have a component of acute on chronic diastolic CHF and was given Lasix.  Subjective / 24h Interval events: Still asking about discharge, down to 2 L  Assessment & Plan: Principal Problem Acute hypoxic respiratory failure, possibly post Covid pneumonitis versus acute on chronic diastolic CHF -Patient received Lasix on admission with some improvement in her respiratory status -Due to concern for post Covid pneumonitis steroids were added, no more than 10 days -2D echo done showed severe concentric LVH consistent with hypertrophic cardiomyopathy but no significant obstruction at rest, LVEF 70-75% and hyperdynamic function.  It also showed grade 1 diastolic dysfunction.  Hold further Lasix as she looks intravascularly depleted -Continue TED hoses and elevation of the lower extremities for chronic edema  Active Problems Hypertrophic cardiomyopathy, severe concentric LVH, grade 1 diastolic dysfunction -Patient does not have a prior echo in the system. -Stop amlodipine, Imdur as this can worsen heart failure symptoms.  She seems to be tolerating  beta-blockers.  Added back on lisinopril low-dose given hypertension.  Essential hypertension -Tolerating beta-blockers and lisinopril.  Her amlodipine and Imdur were discontinued due to her hypertrophic cardiomyopathy.  Blood pressure somewhat acceptable  Dementia, behavioral disturbances -Consulted psych as well as appreciate reevaluation and follow-up.  She was IVC by family due to homicidal ideation as she was threatening the daughter to kill her in her sleep -Remains confused but pleasant.  Psych consulted, needs Geri psych placement  Scheduled Meds: . (feeding supplement) PROSource Plus  30 mL Oral BID BM  . enoxaparin (LOVENOX) injection  30 mg Subcutaneous Q24H  . famotidine  20 mg Oral Daily  . feeding supplement  237 mL Oral TID BM  . hydrochlorothiazide  12.5 mg Oral Daily  . lisinopril  10 mg Oral Daily  . metoprolol tartrate  12.5 mg Oral BID  . mometasone-formoterol  2 puff Inhalation BID  . multivitamin with minerals  1 tablet Oral Daily  . nicotine  21 mg Transdermal Daily  . predniSONE  40 mg Oral Q breakfast  . QUEtiapine  25 mg Oral QHS   Continuous Infusions: PRN Meds:.acetaminophen, albuterol, alum & mag hydroxide-simeth, ondansetron **OR** ondansetron (ZOFRAN) IV  Diet Orders (From admission, onward)    Start     Ordered   02/26/20 0356  Diet Heart Room service appropriate? Yes; Fluid consistency: Thin  Diet effective now       Question Answer Comment  Room service appropriate? Yes   Fluid consistency: Thin      02/26/20 0358          DVT prophylaxis: Place TED hose Start: 02/29/20 0918 enoxaparin (LOVENOX) injection 30 mg Start: 02/28/20 1000     Code Status: DNR  Family Communication:  daughter over the phone  Status is: Inpatient  Remains inpatient appropriate because:Inpatient level of care appropriate due to severity of illness   Dispo: The patient is from: Home              Anticipated d/c is to: SNF, Geri psych              Anticipated  d/c date is: 3 days              Patient currently is not medically stable to d/c.   Difficult to place patient Yes   Level of care: Telemetry Medical  Consultants:  None   Procedures:  2D echo:  1. Severe concentric LVH with cavity obliteration of mid cavity and apex with systole consistent with hypertrophic cardiomyopathy Turbulent flow through LV/LVOT but no significant obstruction at rest. . Left ventricular ejection fraction, by estimation, is 70 to 75%. The left ventricle has hyperdynamic function. The left ventricle has no regional wall motion abnormalities. Left ventricular diastolic parameters are consistent with Grade I diastolic dysfunction (impaired relaxation).  2. Right ventricular systolic function is normal. The right ventricular size is normal. There is normal pulmonary artery systolic pressure.  3. A small pericardial effusion is present.  4. The mitral valve is abnormal.  5. The inferior vena cava is normal in size with greater than 50%  respiratory variability, suggesting right atrial pressure of 3 mmHg.   Microbiology  None   Antimicrobials: None     Objective: Vitals:   02/29/20 0410 02/29/20 1500 02/29/20 2035 03/01/20 0430  BP: 136/67 110/69 (!) 149/90 (!) 145/91  Pulse:   63 (!) 54  Resp: 20  18 17   Temp: 97.6 F (36.4 C) 98.4 F (36.9 C) 98.1 F (36.7 C) 97.8 F (36.6 C)  TempSrc: Oral Oral Oral Oral  SpO2: 99% 100% 90% 99%  Weight:      Height:        Intake/Output Summary (Last 24 hours) at 03/01/2020 1015 Last data filed at 02/29/2020 1843 Gross per 24 hour  Intake 480 ml  Output --  Net 480 ml   Filed Weights   02/26/20 1100  Weight: 48 kg    Examination:  Constitutional: No distress, pleasant but confused Eyes: No icterus ENMT: mmm Neck: normal, supple Respiratory: Bibasilar rhonchi-improving, no wheezing, moves air well Cardiovascular: Regular rate and rhythm, no murmurs, 1+ edema Abdomen: Soft, nontender, nondistended,  bowel sounds positive Musculoskeletal: no clubbing / cyanosis.  Skin: No rashes seen Neurologic: No focal deficits  Data Reviewed: I have independently reviewed following labs and imaging studies   CBC: Recent Labs  Lab 02/24/20 1536 02/26/20 0229 02/27/20 0232 02/28/20 0425 03/01/20 0423  WBC 8.5 8.5 10.1 10.1 7.6  NEUTROABS  --  6.7  --   --   --   HGB 14.4 12.8 11.0* 11.5* 11.9*  HCT 47.8* 42.1 34.0* 38.1 36.9  MCV 94.5 93.8 90.7 93.2 91.6  PLT 271 244 198 219 219   Basic Metabolic Panel: Recent Labs  Lab 02/24/20 1536 02/26/20 0229 02/27/20 0232 02/28/20 0425 03/01/20 0423  NA 142 139 137 138 138  K 4.0 4.0 4.2 4.7 5.2*  CL 101 97* 95* 94* 91*  CO2 32 35* 36* 38* 36*  GLUCOSE 88 87 107* 102* 89  BUN 35* 23 32* 31* 40*  CREATININE 0.97 1.13* 1.22* 1.13* 1.22*  CALCIUM 8.9 8.4* 8.4* 8.9 9.2  MG  --   --   --   --  1.9   Liver Function Tests: Recent Labs  Lab 02/24/20 1536 02/26/20 0229 02/27/20 0232 02/28/20 0425  AST 29 21 18 19   ALT 25 20 17 19   ALKPHOS 50 43 38 39  BILITOT 0.8 0.8 0.6 0.7  PROT 6.4* 5.2* 4.9* 5.2*  ALBUMIN 2.9* 2.3* 2.1* 2.2*   Coagulation Profile: No results for input(s): INR, PROTIME in the last 168 hours. HbA1C: No results for input(s): HGBA1C in the last 72 hours. CBG: No results for input(s): GLUCAP in the last 168 hours.  Recent Results (from the past 240 hour(s))  Blood Culture (routine x 2)     Status: None (Preliminary result)   Collection Time: 02/26/20  3:00 AM   Specimen: BLOOD LEFT FOREARM  Result Value Ref Range Status   Specimen Description BLOOD LEFT FOREARM  Final   Special Requests   Final    BOTTLES DRAWN AEROBIC AND ANAEROBIC Blood Culture adequate volume   Culture   Final    NO GROWTH 4 DAYS Performed at [facility], [address] [place], [address] [place]    Report Status PENDING  Incomplete  Blood Culture (routine x 2)     Status: None (Preliminary result)   Collection Time: 02/26/20   3:20 AM   Specimen: BLOOD RIGHT FOREARM  Result Value Ref Range Status   Specimen Description BLOOD RIGHT FOREARM  Final   Special Requests   Final    BOTTLES DRAWN AEROBIC AND ANAEROBIC Blood Culture adequate volume   Culture   Final    NO GROWTH 4 DAYS Performed at [facility], [address], [place], [address] [place]    Report Status PENDING  Incomplete     Radiology Studies: No results found.  [place], MD, PhD [facility]  Between 7 am - 7 pm I am available, please contact me via Amion or Securechat  Between 7 pm - 7 am I am not available, please contact night coverage MD/APP via Amion

## 2020-03-01 NOTE — Progress Notes (Signed)
CSW confirmed with magistrate that patient's IVC is effective through 03/02/20 (since done on 02/24/20). CSW will renew IVC tomorrow.   Analyah Mcconnon LCSW

## 2020-03-02 DIAGNOSIS — J9601 Acute respiratory failure with hypoxia: Secondary | ICD-10-CM | POA: Diagnosis not present

## 2020-03-02 LAB — BASIC METABOLIC PANEL
Anion gap: 11 (ref 5–15)
BUN: 42 mg/dL — ABNORMAL HIGH (ref 8–23)
CO2: 38 mmol/L — ABNORMAL HIGH (ref 22–32)
Calcium: 9.6 mg/dL (ref 8.9–10.3)
Chloride: 89 mmol/L — ABNORMAL LOW (ref 98–111)
Creatinine, Ser: 0.99 mg/dL (ref 0.44–1.00)
GFR, Estimated: 58 mL/min — ABNORMAL LOW (ref >60)
Glucose, Bld: 101 mg/dL — ABNORMAL HIGH (ref 70–99)
Potassium: 4.1 mmol/L (ref 3.5–5.1)
Sodium: 138 mmol/L (ref 135–145)

## 2020-03-02 LAB — CULTURE, BLOOD (ROUTINE X 2)
Culture: NO GROWTH
Culture: NO GROWTH
Special Requests: ADEQUATE
Special Requests: ADEQUATE

## 2020-03-02 NOTE — Progress Notes (Addendum)
9:41am-MD signed IVC paperwork; CSW will get notarized and send to magistrate.   2pm-Confirmed receipt by magistrate. Contacted [employer] to serve IVC, paperwork on hard chart.   Nadia Rayyan LCSW

## 2020-03-02 NOTE — Progress Notes (Signed)
PROGRESS NOTE  Patricia Terrell OJJ:[id number] DOB: [birth year] DOA: 02/24/2020 PCP: Pcp, No   LOS: 5 days   Brief Narrative / Interim history: 80 year old female with history of dementia, COPD/emphysema, hypertension. She was admitted 1/21-1/24 for COPD exacerbation, was found to be COVID-19 positive. A CT scan during that admission was negative for PE and also negative for multifocal pneumonia. She received 3 days of Remdesivir and was recommended to have 10 days of isolation which she completed. She was discharged home, however became more confused, belligerent, allegedly threatening to kill her daughter and she was IVC on 2/1 and brought back to the ED. On 2/3 she was noted to have respiratory distress, requiring nonrebreather. Chest x-ray showed patchy airspace opacities superimposed on chronic calcified pleural plaques. BNP was elevated and was thought to have a component of acute on chronic diastolic CHF and was given Lasix and admitted to the hospital.  Subjective / 24h Interval events: Feeling well, on room air this morning.  Asking about discharge, remains confused and tells me she has an apartment but cannot tell me where.  Assessment & Plan: Principal Problem Acute hypoxic respiratory failure, possibly post Covid pneumonitis versus acute on chronic diastolic CHF -It is likely that her respiratory failure was caused by fluid overload mainly.  Daughter tells me that patient has been noncompliant with her medications at home, and when she was in the ED for couple of days in psych hold she received all her home medications, possibly causing hypotension which can precipitate heart failure given her significant hypertrophic cardiomyopathy.  She was given Lasix and currently on room air -Post Covid Covid pneumonitis is also a possibility and she has been placed on steroids.  Will do limited course for max 10 days -2D echo done showed severe concentric LVH consistent with hypertrophic cardiomyopathy  but no significant obstruction at rest, LVEF 70-75% and hyperdynamic function.  It also showed grade 1 diastolic dysfunction.  -Continue TED hoses and elevation of the lower extremities for chronic edema  Active Problems Hypertrophic cardiomyopathy, severe concentric LVH, grade 1 diastolic dysfunction -Patient does not have a prior echo in the system. -Stop amlodipine, Imdur as this can worsen heart failure symptoms.  She seems to be tolerating beta-blockers.  Added back on low-dose lisinopril given hypertension.  Blood pressure better  Essential hypertension -Tolerating beta-blockers and lisinopril.  Her amlodipine and Imdur were discontinued due to her hypertrophic cardiomyopathy.  Blood pressure better  Dementia, behavioral disturbances -Consulted psych as well as appreciate reevaluation and follow-up.  She was IVC by family due to homicidal ideation as she was threatening the daughter to kill her in her sleep.  Renew IVC today.  She was evaluated by psychiatry who recommended Geri psych -Remains confused but pleasant  Scheduled Meds: . (feeding supplement) PROSource Plus  30 mL Oral BID BM  . enoxaparin (LOVENOX) injection  30 mg Subcutaneous Q24H  . famotidine  20 mg Oral Daily  . feeding supplement  237 mL Oral TID BM  . hydrochlorothiazide  12.5 mg Oral Daily  . lisinopril  10 mg Oral Daily  . metoprolol tartrate  12.5 mg Oral BID  . mometasone-formoterol  2 puff Inhalation BID  . multivitamin with minerals  1 tablet Oral Daily  . nicotine  21 mg Transdermal Daily  . predniSONE  40 mg Oral Q breakfast  . QUEtiapine  25 mg Oral QHS   Continuous Infusions: PRN Meds:.acetaminophen, albuterol, alum & mag hydroxide-simeth, ondansetron **OR** ondansetron (ZOFRAN) IV  Diet Orders (  From admission, onward)    Start     Ordered   02/26/20 0356  Diet Heart Room service appropriate? Yes; Fluid consistency: Thin  Diet effective now       Question Answer Comment  Room service  appropriate? Yes   Fluid consistency: Thin      02/26/20 0358          DVT prophylaxis: Place TED hose Start: 02/29/20 0918 enoxaparin (LOVENOX) injection 30 mg Start: 02/28/20 1000     Code Status: DNR  Family Communication: daughter over the phone 2/7  Status is: Inpatient  Remains inpatient appropriate because:Inpatient level of care appropriate due to severity of illness   Dispo: The patient is from: Home              Anticipated d/c is to: SNF, Geri psych              Anticipated d/c date is: 3 days              Patient currently is not medically stable to d/c.   Difficult to place patient Yes   Level of care: Telemetry Medical  Consultants:  None   Procedures:  2D echo:  1. Severe concentric LVH with cavity obliteration of mid cavity and apex with systole consistent with hypertrophic cardiomyopathy Turbulent flow through LV/LVOT but no significant obstruction at rest. . Left ventricular ejection fraction, by estimation, is 70 to 75%. The left ventricle has hyperdynamic function. The left ventricle has no regional wall motion abnormalities. Left ventricular diastolic parameters are consistent with Grade I diastolic dysfunction (impaired relaxation).  2. Right ventricular systolic function is normal. The right ventricular size is normal. There is normal pulmonary artery systolic pressure.  3. A small pericardial effusion is present.  4. The mitral valve is abnormal.  5. The inferior vena cava is normal in size with greater than 50%  respiratory variability, suggesting right atrial pressure of 3 mmHg.   Microbiology  None   Antimicrobials: None     Objective: Vitals:   02/29/20 2035 03/01/20 0430 03/01/20 1442 03/01/20 2135  BP: (!) 149/90 (!) 145/91 100/61 128/86  Pulse: 63 (!) 54 70 62  Resp: 18 17 17 18   Temp: 98.1 F (36.7 C) 97.8 F (36.6 C) 98.2 F (36.8 C) 99.2 F (37.3 C)  TempSrc: Oral Oral Oral Oral  SpO2: 90% 99% 100% 94%  Weight:       Height:        Intake/Output Summary (Last 24 hours) at 03/02/2020 1115 Last data filed at 03/02/2020 1025 Gross per 24 hour  Intake 487 ml  Output --  Net 487 ml   Filed Weights   02/26/20 1100  Weight: 48 kg    Examination:  Constitutional: No distress, pleasant but confused Eyes: No icterus ENMT: mmm Neck: normal, supple Respiratory: Diminished at the bases but overall clear, no wheezing, moves air well Cardiovascular: Regular rate and rhythm, no murmurs, 1+ edema Abdomen: Soft, NT, ND, bowel sounds positive Musculoskeletal: no clubbing / cyanosis.  Skin: No rashes seen Neurologic: Nonfocal  Data Reviewed: I have independently reviewed following labs and imaging studies   CBC: Recent Labs  Lab 02/24/20 1536 02/26/20 0229 02/27/20 0232 02/28/20 0425 03/01/20 0423  WBC 8.5 8.5 10.1 10.1 7.6  NEUTROABS  --  6.7  --   --   --   HGB 14.4 12.8 11.0* 11.5* 11.9*  HCT 47.8* 42.1 34.0* 38.1 36.9  MCV 94.5 93.8 90.7 93.2 91.6  PLT 271 244 198 219 219   Basic Metabolic Panel: Recent Labs  Lab 02/24/20 1536 02/26/20 0229 02/27/20 0232 02/28/20 0425 03/01/20 0423  NA 142 139 137 138 138  K 4.0 4.0 4.2 4.7 5.2*  CL 101 97* 95* 94* 91*  CO2 32 35* 36* 38* 36*  GLUCOSE 88 87 107* 102* 89  BUN 35* 23 32* 31* 40*  CREATININE 0.97 1.13* 1.22* 1.13* 1.22*  CALCIUM 8.9 8.4* 8.4* 8.9 9.2  MG  --   --   --   --  1.9   Liver Function Tests: Recent Labs  Lab 02/24/20 1536 02/26/20 0229 02/27/20 0232 02/28/20 0425  AST 29 21 18 19   ALT 25 20 17 19   ALKPHOS 50 43 38 39  BILITOT 0.8 0.8 0.6 0.7  PROT 6.4* 5.2* 4.9* 5.2*  ALBUMIN 2.9* 2.3* 2.1* 2.2*   Coagulation Profile: No results for input(s): INR, PROTIME in the last 168 hours. HbA1C: No results for input(s): HGBA1C in the last 72 hours. CBG: No results for input(s): GLUCAP in the last 168 hours.  Recent Results (from the past 240 hour(s))  Blood Culture (routine x 2)     Status: None   Collection Time:  02/26/20  3:00 AM   Specimen: BLOOD LEFT FOREARM  Result Value Ref Range Status   Specimen Description BLOOD LEFT FOREARM  Final   Special Requests   Final    BOTTLES DRAWN AEROBIC AND ANAEROBIC Blood Culture adequate volume   Culture   Final    NO GROWTH 5 DAYS Performed at [facility], [address], [place], [address] [place]    Report Status 03/02/2020 FINAL  Final  Blood Culture (routine x 2)     Status: None   Collection Time: 02/26/20  3:20 AM   Specimen: BLOOD RIGHT FOREARM  Result Value Ref Range Status   Specimen Description BLOOD RIGHT FOREARM  Final   Special Requests   Final    BOTTLES DRAWN AEROBIC AND ANAEROBIC Blood Culture adequate volume   Culture   Final    NO GROWTH 5 DAYS Performed at [facility], [address] [place], [address] [place]    Report Status 03/02/2020 FINAL  Final     Radiology Studies: No results found.  [id number], MD, PhD [facility]  Between 7 am - 7 pm I am available, please contact me via Amion or Securechat  Between 7 pm - 7 am I am not available, please contact night coverage MD/APP via Amion

## 2020-03-03 DIAGNOSIS — I5032 Chronic diastolic (congestive) heart failure: Secondary | ICD-10-CM | POA: Diagnosis not present

## 2020-03-03 DIAGNOSIS — J449 Chronic obstructive pulmonary disease, unspecified: Secondary | ICD-10-CM | POA: Diagnosis not present

## 2020-03-03 DIAGNOSIS — F0391 Unspecified dementia with behavioral disturbance: Secondary | ICD-10-CM | POA: Diagnosis not present

## 2020-03-03 DIAGNOSIS — J9601 Acute respiratory failure with hypoxia: Secondary | ICD-10-CM | POA: Diagnosis not present

## 2020-03-03 LAB — CBC
HCT: 38.9 % (ref 36.0–46.0)
Hemoglobin: 12 g/dL (ref 12.0–15.0)
MCH: 28.7 pg (ref 26.0–34.0)
MCHC: 30.8 g/dL (ref 30.0–36.0)
MCV: 93.1 fL (ref 80.0–100.0)
Platelets: 232 10*3/uL (ref 150–400)
RBC: 4.18 MIL/uL (ref 3.87–5.11)
RDW: 16.3 % — ABNORMAL HIGH (ref 11.5–15.5)
WBC: 8.1 10*3/uL (ref 4.0–10.5)
nRBC: 0 % (ref 0.0–0.2)

## 2020-03-03 LAB — BASIC METABOLIC PANEL
Anion gap: 12 (ref 5–15)
BUN: 52 mg/dL — ABNORMAL HIGH (ref 8–23)
CO2: 34 mmol/L — ABNORMAL HIGH (ref 22–32)
Calcium: 9.3 mg/dL (ref 8.9–10.3)
Chloride: 90 mmol/L — ABNORMAL LOW (ref 98–111)
Creatinine, Ser: 1.11 mg/dL — ABNORMAL HIGH (ref 0.44–1.00)
GFR, Estimated: 51 mL/min — ABNORMAL LOW (ref >60)
Glucose, Bld: 124 mg/dL — ABNORMAL HIGH (ref 70–99)
Potassium: 4 mmol/L (ref 3.5–5.1)
Sodium: 136 mmol/L (ref 135–145)

## 2020-03-03 MED ORDER — HALOPERIDOL LACTATE 5 MG/ML IJ SOLN
2.0000 mg | Freq: Four times a day (QID) | INTRAMUSCULAR | Status: AC | PRN
  Administered 2020-03-09 – 2020-03-11 (×2): 2 mg via INTRAVENOUS
  Filled 2020-03-03 (×2): qty 1

## 2020-03-03 NOTE — Progress Notes (Signed)
PROGRESS NOTE    Patricia Terrell  BMW:[id number] DOB: [birth year] DOA: 02/24/2020 PCP: Pcp, No   Brief Narrative:  The patient is a 80 year old African-American female with past medical history significant for but not limited to dementia, COPD/emphysema, hypertension as well as other comorbidities was admitted from 02/13/20-02/16/20 for COPD exacerbation and was found to be Covid positive at that time.  A CAT scan during admission was negative for PE but also was negative for multifocal pneumonia.  She received 3 days of remdesivir and was recommended to have 10 days of isolation which she is currently completed.  She was discharged home however she became more confused, belligerent and was retightened to kill her daughter and was IVC on February 1 and brought back to the ED.  On 02/26/2020 she is noted to have respiratory distress requiring nonrebreather.  Chest x-ray showed patchy airspace opacity superimposed on chronic calcified pleural plaques.  BNP was elevated and thought to have a component of acute on chronic diastolic CHF and Lasix was given and she had been made to the hospital.  She is on room air this morning and improved significantly.  Her mentation is better but she continues to remain confused.  She was admitted for acute hypoxic respiratory failure possibly post Covid pneumonitis versus acute on chronic diastolic CHF.  She has been diuresed and has been deemed medically stable for discharge to [facility] psych facility  Assessment & Plan:   Principal Problem:   Acute respiratory failure with hypoxia (HCC) Active Problems:   COPD (chronic obstructive pulmonary disease) (HCC)   HTN (hypertension)   Dementia with behavioral disturbance (HCC)   Chronic diastolic CHF (congestive heart failure) (HCC)   Acute hypoxemic respiratory failure (HCC)  Acute hypoxic respiratory failure, possibly post Covid pneumonitis versus acute on chronic diastolic CHF -It is likely that her respiratory failure was  caused by fluid overload mainly.  Daughter tells me that patient has been noncompliant with her medications at home, and when she was in the ED for couple of days in psych hold she received all her home medications, possibly causing hypotension which can precipitate heart failure given her significant hypertrophic cardiomyopathy.  She was given Lasix and currently on room air -Post Covid Covid pneumonitis is also a possibility and she has been placed on steroids.  Will do limited course for max 10 days; she is on prednisone 40 mg p.o. daily that was started on 02/27/2020 -2D echo done showed severe concentric LVH consistent with hypertrophic cardiomyopathy but no significant obstruction at rest, LVEF 70-75% and hyperdynamic function.  It also showed grade 1 diastolic dysfunction.  -Continue with Dulera 2 puffs IH twice daily and with albuterol 2 puffs IH -Continue TED hoses and elevation of the lower extremities for chronic edema  Hypertrophic cardiomyopathy, severe concentric LVH, grade 1 diastolic dysfunction -Patient does not have a prior echo in the system. -Stop amlodipine, Imdur as this can worsen heart failure symptoms.  She seems to be tolerating beta-blockers but was bradycardic today and will need to watch carefully.  Added back on low-dose lisinopril given hypertension.  Blood pressure better  CKD stage IIIa -Chronic.  BUN/creatinine is now 52/1.11 -avoid further nephrotoxic medications (lisinopril and HCTZ have been resumed), contrast dyes, hypotension renally dose medications -repeat CMP in a.m.  Tobacco Abuse  -Smoking cessation counseling -Continue with nicotine patch 21 mg transdermally every 24 hours  Essential hypertension -Tolerating beta-blockers and lisinopril.  Her amlodipine and Imdur were discontinued due to her hypertrophic cardiomyopathy.  Blood pressure better but today she is bradycardic and may need to hold her beta-blocker as well.  Dementia, behavioral  disturbances -Consulted psych as well as appreciate reevaluation and follow-up.  She was IVC by family due to homicidal ideation as she was threatening the daughter to kill her in her sleep.  Renew IVC yesterday.  She was evaluated by psychiatry who recommended Geri psych -Continue with quetiapine 25 mg p.o. nightly -Remains confused but pleasant  Abnormal EKG -Patient had an EKG which showed T wave inversions and it was reviewed with Dr. Eldridge Dace who felt that since the patient was asymptomatic no further cardiac work-up was needed.  Underweight -Continue with Prosource +30 mmol p.o. twice daily as well as Ensure alive 237 mL p.o. 3 times daily between meals and multivitamin with minerals 1 tab p.o. daily  GERD -Continue with famotidine 20 mg p.o. daily  Goals of care: DNR POA  DVT prophylaxis: Heparin 30 mg subcu every 24 Code Status: DO NOT RESUSCITATE Family Communication: No family present at bedside Disposition Plan: Pending discharge to [facility] psych facility and anticipating discharging tomorrow  Status is: Inpatient  Remains inpatient appropriate because:Unsafe d/c plan, IV treatments appropriate due to intensity of illness or inability to take PO and Inpatient level of care appropriate due to severity of illness   Dispo: The patient is from: Home              Anticipated d/c is to: [facility]               Anticipated d/c date is: 1 day              Patient currently is medically stable to d/c.   Difficult to place patient No  Consultants:   Psychiatry   Procedures: None  Antimicrobials:  Anti-infectives (From admission, onward)   None        Subjective: Seen and examined at bedside and she was pleasant but still slightly confused.  No nausea or vomiting.  Denies any lightheadedness or dizziness.  No chest pain or shortness breath.  No other concerns or complaints at this time.  Objective: Vitals:   03/02/20 1300 03/02/20 2030 03/03/20 0555 03/03/20  1431  BP: 121/76 128/81 (!) 146/71 (!) 113/57  Pulse: 73 65 (!) 57 63  Resp: 18 18 18 18   Temp: 98.7 F (37.1 C) 98.3 F (36.8 C) 98 F (36.7 C)   TempSrc: Axillary Axillary Axillary Oral  SpO2: 95% 93% 98% 99%  Weight:      Height:        Intake/Output Summary (Last 24 hours) at 03/03/2020 1828 Last data filed at 03/03/2020 1815 Gross per 24 hour  Intake 520 ml  Output -  Net 520 ml   Filed Weights   02/26/20 1100  Weight: 48 kg   Examination: Physical Exam:  Constitutional: Thin African-American female currently no acute distress sitting in the chair bedside Eyes: Lids and conjunctivae normal, sclerae anicteric  ENMT: External Ears, Nose appear normal. Grossly normal hearing.  Neck: Appears normal, supple, no cervical masses, normal ROM, no appreciable thyromegaly; no JVD Respiratory: Diminished to auscultation bilaterally, no wheezing, rales, rhonchi or crackles. Normal respiratory effort and patient is not tachypenic. No accessory muscle use.  Unlabored breathing Cardiovascular: RRR, no murmurs / rubs / gallops. S1 and S2 auscultated.  Minimal extremity edema Abdomen: Soft, non-tender, non-distended. Bowel sounds positive.  GU: Deferred. Musculoskeletal: No clubbing / cyanosis of digits/nails. No joint deformity upper and lower  extremities.  Skin: No rashes, lesions, ulcers or limited skin evaluation. No induration; Warm and dry.  Neurologic: CN 2-12 grossly intact with no focal deficits. Romberg sign and cerebellar reflexes not assessed.  Psychiatric: Impaired judgment and insight.  She is awake and alert but not fully oriented x 3. Normal mood and appropriate affect.   Data Reviewed: I have personally reviewed following labs and imaging studies  CBC: Recent Labs  Lab 02/26/20 0229 02/27/20 0232 02/28/20 0425 03/01/20 0423 03/03/20 0026  WBC 8.5 10.1 10.1 7.6 8.1  NEUTROABS 6.7  --   --   --   --   HGB 12.8 11.0* 11.5* 11.9* 12.0  HCT 42.1 34.0* 38.1 36.9 38.9   MCV 93.8 90.7 93.2 91.6 93.1  PLT 244 198 219 219 232   Basic Metabolic Panel: Recent Labs  Lab 02/27/20 0232 02/28/20 0425 03/01/20 0423 03/02/20 1307 03/03/20 0026  NA 137 138 138 138 136  K 4.2 4.7 5.2* 4.1 4.0  CL 95* 94* 91* 89* 90*  CO2 36* 38* 36* 38* 34*  GLUCOSE 107* 102* 89 101* 124*  BUN 32* 31* 40* 42* 52*  CREATININE 1.22* 1.13* 1.22* 0.99 1.11*  CALCIUM 8.4* 8.9 9.2 9.6 9.3  MG  --   --  1.9  --   --    GFR: Estimated Creatinine Clearance: 31.1 mL/min (A) (by C-G formula based on SCr of 1.11 mg/dL (H)). Liver Function Tests: Recent Labs  Lab 02/26/20 0229 02/27/20 0232 02/28/20 0425  AST 21 18 19   ALT 20 17 19   ALKPHOS 43 38 39  BILITOT 0.8 0.6 0.7  PROT 5.2* 4.9* 5.2*  ALBUMIN 2.3* 2.1* 2.2*   No results for input(s): LIPASE, AMYLASE in the last 168 hours. No results for input(s): AMMONIA in the last 168 hours. Coagulation Profile: No results for input(s): INR, PROTIME in the last 168 hours. Cardiac Enzymes: No results for input(s): CKTOTAL, CKMB, CKMBINDEX, TROPONINI in the last 168 hours. BNP (last 3 results) No results for input(s): PROBNP in the last 8760 hours. HbA1C: No results for input(s): HGBA1C in the last 72 hours. CBG: No results for input(s): GLUCAP in the last 168 hours. Lipid Profile: No results for input(s): CHOL, HDL, LDLCALC, TRIG, CHOLHDL, LDLDIRECT in the last 72 hours. Thyroid Function Tests: No results for input(s): TSH, T4TOTAL, FREET4, T3FREE, THYROIDAB in the last 72 hours. Anemia Panel: No results for input(s): VITAMINB12, FOLATE, FERRITIN, TIBC, IRON, RETICCTPCT in the last 72 hours. Sepsis Labs: Recent Labs  Lab 02/26/20 0229  PROCALCITON <0.10  LATICACIDVEN 1.2    Recent Results (from the past 240 hour(s))  Blood Culture (routine x 2)     Status: None   Collection Time: 02/26/20  3:00 AM   Specimen: BLOOD LEFT FOREARM  Result Value Ref Range Status   Specimen Description BLOOD LEFT FOREARM  Final    Special Requests   Final    BOTTLES DRAWN AEROBIC AND ANAEROBIC Blood Culture adequate volume   Culture   Final    NO GROWTH 5 DAYS Performed at [facility], [address] [place], [address] [place]    Report Status 03/02/2020 FINAL  Final  Blood Culture (routine x 2)     Status: None   Collection Time: 02/26/20  3:20 AM   Specimen: BLOOD RIGHT FOREARM  Result Value Ref Range Status   Specimen Description BLOOD RIGHT FOREARM  Final   Special Requests   Final    BOTTLES DRAWN AEROBIC  AND ANAEROBIC Blood Culture adequate volume   Culture   Final    NO GROWTH 5 DAYS Performed at [facility], [address], [place], [place] [address]    Report Status 03/02/2020 FINAL  Final     RN Pressure Injury Documentation:     Estimated body mass index is 17.61 kg/m as calculated from the following:   Height as of this encounter: 5\' 5"  (1.651 m).   Weight as of this encounter: 48 kg.  Malnutrition Type:  Nutrition Problem: Underweight Etiology: chronic illness (dementia)  Malnutrition Characteristics:  Signs/Symptoms: other (comment) (BMI 17.61)  Nutrition Interventions:  Interventions: Ensure Enlive (each supplement provides 350kcal and 20 grams of protein),MVI,Prostat   Radiology Studies: No results found.  Scheduled Meds: . (feeding supplement) PROSource Plus  30 mL Oral BID BM  . enoxaparin (LOVENOX) injection  30 mg Subcutaneous Q24H  . famotidine  20 mg Oral Daily  . feeding supplement  237 mL Oral TID BM  . hydrochlorothiazide  12.5 mg Oral Daily  . lisinopril  10 mg Oral Daily  . metoprolol tartrate  12.5 mg Oral BID  . mometasone-formoterol  2 puff Inhalation BID  . multivitamin with minerals  1 tablet Oral Daily  . nicotine  21 mg Transdermal Daily  . predniSONE  40 mg Oral Q breakfast  . QUEtiapine  25 mg Oral QHS   Continuous Infusions:   LOS: 6 days   , DO [facility] PAGER is on AMION  If 7PM-7AM, please  contact night-coverage [email or web address]

## 2020-03-04 ENCOUNTER — Inpatient Hospital Stay (HOSPITAL_COMMUNITY): Payer: Medicare HMO

## 2020-03-04 DIAGNOSIS — F0391 Unspecified dementia with behavioral disturbance: Secondary | ICD-10-CM | POA: Diagnosis not present

## 2020-03-04 DIAGNOSIS — I5032 Chronic diastolic (congestive) heart failure: Secondary | ICD-10-CM | POA: Diagnosis not present

## 2020-03-04 DIAGNOSIS — J9601 Acute respiratory failure with hypoxia: Secondary | ICD-10-CM | POA: Diagnosis not present

## 2020-03-04 DIAGNOSIS — J449 Chronic obstructive pulmonary disease, unspecified: Secondary | ICD-10-CM | POA: Diagnosis not present

## 2020-03-04 LAB — COMPREHENSIVE METABOLIC PANEL
ALT: 35 U/L (ref 0–44)
AST: 31 U/L (ref 15–41)
Albumin: 2.3 g/dL — ABNORMAL LOW (ref 3.5–5.0)
Alkaline Phosphatase: 34 U/L — ABNORMAL LOW (ref 38–126)
Anion gap: 10 (ref 5–15)
BUN: 48 mg/dL — ABNORMAL HIGH (ref 8–23)
CO2: 38 mmol/L — ABNORMAL HIGH (ref 22–32)
Calcium: 9.4 mg/dL (ref 8.9–10.3)
Chloride: 90 mmol/L — ABNORMAL LOW (ref 98–111)
Creatinine, Ser: 1.36 mg/dL — ABNORMAL HIGH (ref 0.44–1.00)
GFR, Estimated: 40 mL/min — ABNORMAL LOW (ref >60)
Glucose, Bld: 83 mg/dL (ref 70–99)
Potassium: 4.2 mmol/L (ref 3.5–5.1)
Sodium: 138 mmol/L (ref 135–145)
Total Bilirubin: 0.9 mg/dL (ref 0.3–1.2)
Total Protein: 5 g/dL — ABNORMAL LOW (ref 6.5–8.1)

## 2020-03-04 LAB — MAGNESIUM: Magnesium: 1.9 mg/dL (ref 1.7–2.4)

## 2020-03-04 LAB — CBC WITH DIFFERENTIAL/PLATELET
Abs Immature Granulocytes: 0.03 10*3/uL (ref 0.00–0.07)
Basophils Absolute: 0 10*3/uL (ref 0.0–0.1)
Basophils Relative: 0 %
Eosinophils Absolute: 0 10*3/uL (ref 0.0–0.5)
Eosinophils Relative: 0 %
HCT: 37.3 % (ref 36.0–46.0)
Hemoglobin: 12 g/dL (ref 12.0–15.0)
Immature Granulocytes: 0 %
Lymphocytes Relative: 8 %
Lymphs Abs: 0.6 10*3/uL — ABNORMAL LOW (ref 0.7–4.0)
MCH: 29.5 pg (ref 26.0–34.0)
MCHC: 32.2 g/dL (ref 30.0–36.0)
MCV: 91.6 fL (ref 80.0–100.0)
Monocytes Absolute: 0.4 10*3/uL (ref 0.1–1.0)
Monocytes Relative: 4 %
Neutro Abs: 7.1 10*3/uL (ref 1.7–7.7)
Neutrophils Relative %: 88 %
Platelets: 237 10*3/uL (ref 150–400)
RBC: 4.07 MIL/uL (ref 3.87–5.11)
RDW: 16.4 % — ABNORMAL HIGH (ref 11.5–15.5)
WBC: 8.1 10*3/uL (ref 4.0–10.5)
nRBC: 0 % (ref 0.0–0.2)

## 2020-03-04 LAB — PHOSPHORUS: Phosphorus: 3 mg/dL (ref 2.5–4.6)

## 2020-03-04 NOTE — Progress Notes (Signed)
PROGRESS NOTE    Patricia Terrell  ERX:[id number] DOB: May 31, 1940 DOA: 02/24/2020 PCP: Pcp, No   Brief Narrative:  The patient is a 80 year old African-American female with past medical history significant for but not limited to dementia, COPD/emphysema, hypertension as well as other comorbidities was admitted from 02/13/20-02/16/20 for COPD exacerbation and was found to be Covid positive at that time.  A CAT scan during admission was negative for PE but also was negative for multifocal pneumonia.  She received 3 days of remdesivir and was recommended to have 10 days of isolation which she is currently completed.  She was discharged home however she became more confused, belligerent and was Threatening to kill her daughter and was IVC on February 1 and brought back to the ED.  On 02/26/2020 she is noted to have respiratory distress requiring nonrebreather.  Chest x-ray showed patchy airspace opacity superimposed on chronic calcified pleural plaques.  BNP was elevated and thought to have a component of acute on chronic diastolic CHF and Lasix was given and she had been made to the hospital.  She is on room air this morning and improved significantly.  Her mentation is better but she continues to remain confused.  She was admitted for acute hypoxic respiratory failure possibly post Covid pneumonitis versus acute on chronic diastolic CHF.  She has been diuresed and has been deemed medically stable for discharge to [facility] psych facility but remains intermittently on O2.  Assessment & Plan:   Principal Problem:   Acute respiratory failure with hypoxia (HCC) Active Problems:   COPD (chronic obstructive pulmonary disease) (HCC)   HTN (hypertension)   Dementia with behavioral disturbance (HCC)   Chronic diastolic CHF (congestive heart failure) (HCC)   Acute hypoxemic respiratory failure (HCC)  Acute hypoxic respiratory failure, possibly post Covid pneumonitis versus acute on chronic diastolic CHF -It is likely  that her respiratory failure was caused by fluid overload mainly.  Daughter tells me that patient has been noncompliant with her medications at home, and when she was in the ED for couple of days in psych hold she received all her home medications, possibly causing hypotension which can precipitate heart failure given her significant hypertrophic cardiomyopathy.  She was given Lasix and currently on room air but back on Supplemental O2  At 3 Liters today -SpO2: 94 % O2 Flow Rate (L/min): 3 L/min -Post Covid Covid pneumonitis is also a possibility and she has been placed on steroids.  Will do limited course for max 10 days; she is on prednisone 40 mg p.o. daily that was started on 02/27/2020 -2D echo done showed severe concentric LVH consistent with hypertrophic cardiomyopathy but no significant obstruction at rest, LVEF 70-75% and hyperdynamic function.  It also showed grade 1 diastolic dysfunction.  -Will repeat a CXR  -Continue with Dulera 2 puffs IH twice daily and with albuterol 2 puffs IH -Continue TED hoses and elevation of the lower extremities for chronic edema  Hypertrophic cardiomyopathy, severe concentric LVH, grade 1 diastolic dysfunction -Patient does not have a prior echo in the system. -Stop amlodipine, Imdur as this can worsen heart failure symptoms.  She seems to be tolerating beta-blockers but was bradycardic today and will need to watch carefully.  Added back on low-dose lisinopril and HCTZ given hypertension but will to he held given hypotension and slightly worsening renal fxn.  Blood pressure better -Last BP was 102/57  CKD stage IIIa -Chronic.  BUN/creatinine is now 52/1.11 yesterday and today is now 48/1.36 -avoid further nephrotoxic  medications (lisinopril and HCTZ had been resumed but will hold again), contrast dyes, hypotension renally dose medications -repeat CMP in a.m.  Tobacco Abuse  -Smoking cessation counseling -Continue with nicotine patch 21 mg transdermally  every 24 hours  Essential Hypertension -Tolerating beta-blockers but will hold Lisinopril.  Her amlodipine and Imdur were discontinued due to her hypertrophic cardiomyopathy.  Blood pressure better but yesterday she was bradycardic and may need to hold her beta-blocker as well.  Dementia, behavioral disturbances -Consulted psych as well as appreciate reevaluation and follow-up.  She was IVC by family due to homicidal ideation as she was threatening the daughter to kill her in her sleep.  Renew IVC yesterday.  She was evaluated by psychiatry who recommended Geri psych -Continue with quetiapine 25 mg p.o. nightly -Remains confused but pleasant -Had to be given Haldol Lactate last night and is now on 2 mg IV q6hprn   Abnormal EKG -Patient had an EKG which showed T wave inversions and it was reviewed with Dr. Eldridge Dace who felt that since the patient was asymptomatic no further cardiac work-up was needed.  Underweight -Continue with Prosource +30 mmol p.o. twice daily as well as Ensure alive 237 mL p.o. 3 times daily between meals and multivitamin with minerals 1 tab p.o. daily  GERD -Continue with Famotidine 20 mg p.o. daily  Goals of care: DNR POA  DVT prophylaxis: Heparin 30 mg subcu every 24 Code Status: DO NOT RESUSCITATE Family Communication: No family present at bedside Disposition Plan: Pending discharge to [facility] psych facility and anticipating discharging tomorrow  Status is: Inpatient  Remains inpatient appropriate because:Unsafe d/c plan, IV treatments appropriate due to intensity of illness or inability to take PO and Inpatient level of care appropriate due to severity of illness   Dispo: The patient is from: Home              Anticipated d/c is to: [facility]               Anticipated d/c date is: 1 day              Patient currently is medically stable to d/c.   Difficult to place patient No  Consultants:   Psychiatry   Procedures: None  Antimicrobials:   Anti-infectives (From admission, onward)   None        Subjective: Seen and examined at bedside and and she was much more awake alert but last night she is confused and repeating Haldol.  No nausea or vomiting.  Denies any lightheadedness or dizziness.  No pain today and has no further complaints or concerns at this time.  Objective: Vitals:   03/04/20 0410 03/04/20 0617 03/04/20 0800 03/04/20 1204  BP:  140/88 (!) 90/58 (!) 102/57  Pulse:  (!) 51 68 (!) 57  Resp:  18 18 20   Temp: (!) 96.2 F (35.7 C) 98.4 F (36.9 C) 97.8 F (36.6 C) 98.1 F (36.7 C)  TempSrc:  Oral Oral Oral  SpO2:   92% 94%  Weight:      Height:        Intake/Output Summary (Last 24 hours) at 03/04/2020 1517 Last data filed at 03/04/2020 1339 Gross per 24 hour  Intake 600 ml  Output --  Net 600 ml   Filed Weights   02/26/20 1100  Weight: 48 kg   Examination: Physical Exam:  Constitutional: Patient is a thin African-American female currently in NAD and appears calm  Eyes: Lids and conjunctivae normal, sclerae anicteric  ENMT: External Ears, Nose appear normal. Grossly normal hearing.  Neck: Appears normal, supple, no cervical masses, normal ROM, no appreciable thyromegaly; no JVD Respiratory: Diminished to auscultation bilaterally with coarse breath sounds, no wheezing, rales, rhonchi or crackles. Normal respiratory effort and patient is not tachypenic. No accessory muscle use.  Was wearing supplemental oxygen via nasal cannula today Cardiovascular: RRR, no murmurs / rubs / gallops. S1 and S2 auscultated.  No appreciable extremity edema Abdomen: Soft, non-tender, non-distended. Bowel sounds positive.  GU: Deferred. Musculoskeletal: No clubbing / cyanosis of digits/nails. No joint deformity upper and lower extremities.  Skin: No rashes, lesions, ulcers on limited skin evaluation. No induration; Warm and dry.  Neurologic: CN 2-12 grossly intact with no focal deficits. Romberg sign and cerebellar  reflexes not assessed.  Psychiatric: Normal judgment and insight. Alert and oriented x 3. Normal mood and appropriate affect.    Data Reviewed: I have personally reviewed following labs and imaging studies  CBC: Recent Labs  Lab 02/27/20 0232 02/28/20 0425 03/01/20 0423 03/03/20 0026 03/04/20 0121  WBC 10.1 10.1 7.6 8.1 8.1  NEUTROABS  --   --   --   --  7.1  HGB 11.0* 11.5* 11.9* 12.0 12.0  HCT 34.0* 38.1 36.9 38.9 37.3  MCV 90.7 93.2 91.6 93.1 91.6  PLT 198 219 219 232 237   Basic Metabolic Panel: Recent Labs  Lab 02/28/20 0425 03/01/20 0423 03/02/20 1307 03/03/20 0026 03/04/20 0121  NA 138 138 138 136 138  K 4.7 5.2* 4.1 4.0 4.2  CL 94* 91* 89* 90* 90*  CO2 38* 36* 38* 34* 38*  GLUCOSE 102* 89 101* 124* 83  BUN 31* 40* 42* 52* 48*  CREATININE 1.13* 1.22* 0.99 1.11* 1.36*  CALCIUM 8.9 9.2 9.6 9.3 9.4  MG  --  1.9  --   --  1.9  PHOS  --   --   --   --  3.0   GFR: Estimated Creatinine Clearance: 25.4 mL/min (A) (by C-G formula based on SCr of 1.36 mg/dL (H)). Liver Function Tests: Recent Labs  Lab 02/27/20 0232 02/28/20 0425 03/04/20 0121  AST 18 19 31   ALT 17 19 35  ALKPHOS 38 39 34*  BILITOT 0.6 0.7 0.9  PROT 4.9* 5.2* 5.0*  ALBUMIN 2.1* 2.2* 2.3*   No results for input(s): LIPASE, AMYLASE in the last 168 hours. No results for input(s): AMMONIA in the last 168 hours. Coagulation Profile: No results for input(s): INR, PROTIME in the last 168 hours. Cardiac Enzymes: No results for input(s): CKTOTAL, CKMB, CKMBINDEX, TROPONINI in the last 168 hours. BNP (last 3 results) No results for input(s): PROBNP in the last 8760 hours. HbA1C: No results for input(s): HGBA1C in the last 72 hours. CBG: No results for input(s): GLUCAP in the last 168 hours. Lipid Profile: No results for input(s): CHOL, HDL, LDLCALC, TRIG, CHOLHDL, LDLDIRECT in the last 72 hours. Thyroid Function Tests: No results for input(s): TSH, T4TOTAL, FREET4, T3FREE, THYROIDAB in the last  72 hours. Anemia Panel: No results for input(s): VITAMINB12, FOLATE, FERRITIN, TIBC, IRON, RETICCTPCT in the last 72 hours. Sepsis Labs: No results for input(s): PROCALCITON, LATICACIDVEN in the last 168 hours.  Recent Results (from the past 240 hour(s))  Blood Culture (routine x 2)     Status: None   Collection Time: 02/26/20  3:00 AM   Specimen: BLOOD LEFT FOREARM  Result Value Ref Range Status   Specimen Description BLOOD LEFT FOREARM  Final   Special Requests  Final    BOTTLES DRAWN AEROBIC AND ANAEROBIC Blood Culture adequate volume   Culture   Final    NO GROWTH 5 DAYS Performed at [facility], [address] [place], [place] [address]    Report Status 03/02/2020 FINAL  Final  Blood Culture (routine x 2)     Status: None   Collection Time: 02/26/20  3:20 AM   Specimen: BLOOD RIGHT FOREARM  Result Value Ref Range Status   Specimen Description BLOOD RIGHT FOREARM  Final   Special Requests   Final    BOTTLES DRAWN AEROBIC AND ANAEROBIC Blood Culture adequate volume   Culture   Final    NO GROWTH 5 DAYS Performed at [facility], [address] [place], [place] [address]    Report Status 03/02/2020 FINAL  Final     RN Pressure Injury Documentation:     Estimated body mass index is 17.61 kg/m as calculated from the following:   Height as of this encounter: 5\' 5"  (1.651 m).   Weight as of this encounter: 48 kg.  Malnutrition Type:  Nutrition Problem: Underweight Etiology: chronic illness (dementia)  Malnutrition Characteristics:  Signs/Symptoms: other (comment) (BMI 17.61)  Nutrition Interventions:  Interventions: Ensure Enlive (each supplement provides 350kcal and 20 grams of protein),MVI,Prostat   Radiology Studies: No results found.  Scheduled Meds: . (feeding supplement) PROSource Plus  30 mL Oral BID BM  . enoxaparin (LOVENOX) injection  30 mg Subcutaneous Q24H  . famotidine  20 mg Oral Daily  . feeding supplement  237 mL Oral TID BM   . hydrochlorothiazide  12.5 mg Oral Daily  . lisinopril  10 mg Oral Daily  . metoprolol tartrate  12.5 mg Oral BID  . mometasone-formoterol  2 puff Inhalation BID  . multivitamin with minerals  1 tablet Oral Daily  . nicotine  21 mg Transdermal Daily  . predniSONE  40 mg Oral Q breakfast  . QUEtiapine  25 mg Oral QHS   Continuous Infusions:   LOS: 7 days   Merlene Laughter, DO [facility] PAGER is on AMION  If 7PM-7AM, please contact night-coverage [email or web address]

## 2020-03-04 NOTE — Care Management Important Message (Signed)
Important Message  Patient Details  Name: Patricia Terrell MRN: [id number] Date of Birth: 08-Sep-1940   Medicare Important Message Given:  Yes - Important Message mailed due to current National Emergency   Verbal consent obtained due to current National Emergency  Relationship to patient: Self Contact Name: Alayia Call Date: 03/04/20  Time: 1230 Phone: [phone] Outcome: Spoke with contact Important Message mailed to: Patient address on file    Orson Aloe 03/04/2020, 12:30 PM

## 2020-03-04 NOTE — Progress Notes (Addendum)
8:41am-CSW sent referral to [facility] and [facility] for review now that patient is medically stable. Patient is now 21 days past her COVID + test. Also faxing to [facility], [facility], and [facility].  9:28am-[facility] do not accept patients on oxygen. [facility] unable to accept due to dementia. Faxed to [facility].   Nadia Rayyan LCSW

## 2020-03-05 ENCOUNTER — Inpatient Hospital Stay (HOSPITAL_COMMUNITY): Payer: Medicare HMO

## 2020-03-05 ENCOUNTER — Encounter (HOSPITAL_COMMUNITY): Payer: Self-pay | Admitting: Internal Medicine

## 2020-03-05 DIAGNOSIS — J9601 Acute respiratory failure with hypoxia: Secondary | ICD-10-CM | POA: Diagnosis not present

## 2020-03-05 DIAGNOSIS — F0391 Unspecified dementia with behavioral disturbance: Secondary | ICD-10-CM | POA: Diagnosis not present

## 2020-03-05 DIAGNOSIS — J449 Chronic obstructive pulmonary disease, unspecified: Secondary | ICD-10-CM | POA: Diagnosis not present

## 2020-03-05 DIAGNOSIS — I5032 Chronic diastolic (congestive) heart failure: Secondary | ICD-10-CM | POA: Diagnosis not present

## 2020-03-05 LAB — CBC WITH DIFFERENTIAL/PLATELET
Abs Immature Granulocytes: 0.02 10*3/uL (ref 0.00–0.07)
Basophils Absolute: 0 10*3/uL (ref 0.0–0.1)
Basophils Relative: 0 %
Eosinophils Absolute: 0 10*3/uL (ref 0.0–0.5)
Eosinophils Relative: 0 %
HCT: 39.6 % (ref 36.0–46.0)
Hemoglobin: 12.7 g/dL (ref 12.0–15.0)
Immature Granulocytes: 0 %
Lymphocytes Relative: 8 %
Lymphs Abs: 0.6 10*3/uL — ABNORMAL LOW (ref 0.7–4.0)
MCH: 29.7 pg (ref 26.0–34.0)
MCHC: 32.1 g/dL (ref 30.0–36.0)
MCV: 92.5 fL (ref 80.0–100.0)
Monocytes Absolute: 0.2 10*3/uL (ref 0.1–1.0)
Monocytes Relative: 3 %
Neutro Abs: 6.1 10*3/uL (ref 1.7–7.7)
Neutrophils Relative %: 89 %
Platelets: 213 10*3/uL (ref 150–400)
RBC: 4.28 MIL/uL (ref 3.87–5.11)
RDW: 16.3 % — ABNORMAL HIGH (ref 11.5–15.5)
WBC: 6.9 10*3/uL (ref 4.0–10.5)
nRBC: 0 % (ref 0.0–0.2)

## 2020-03-05 LAB — COMPREHENSIVE METABOLIC PANEL
ALT: 39 U/L (ref 0–44)
AST: 37 U/L (ref 15–41)
Albumin: 2.4 g/dL — ABNORMAL LOW (ref 3.5–5.0)
Alkaline Phosphatase: 36 U/L — ABNORMAL LOW (ref 38–126)
Anion gap: 8 (ref 5–15)
BUN: 47 mg/dL — ABNORMAL HIGH (ref 8–23)
CO2: 39 mmol/L — ABNORMAL HIGH (ref 22–32)
Calcium: 9 mg/dL (ref 8.9–10.3)
Chloride: 92 mmol/L — ABNORMAL LOW (ref 98–111)
Creatinine, Ser: 1.11 mg/dL — ABNORMAL HIGH (ref 0.44–1.00)
GFR, Estimated: 51 mL/min — ABNORMAL LOW (ref >60)
Glucose, Bld: 108 mg/dL — ABNORMAL HIGH (ref 70–99)
Potassium: 4.5 mmol/L (ref 3.5–5.1)
Sodium: 139 mmol/L (ref 135–145)
Total Bilirubin: 0.7 mg/dL (ref 0.3–1.2)
Total Protein: 5.3 g/dL — ABNORMAL LOW (ref 6.5–8.1)

## 2020-03-05 LAB — PHOSPHORUS: Phosphorus: 3.4 mg/dL (ref 2.5–4.6)

## 2020-03-05 LAB — MAGNESIUM: Magnesium: 1.9 mg/dL (ref 1.7–2.4)

## 2020-03-05 MED ORDER — FUROSEMIDE 10 MG/ML IJ SOLN
20.0000 mg | Freq: Once | INTRAMUSCULAR | Status: AC
  Administered 2020-03-05: 20 mg via INTRAVENOUS
  Filled 2020-03-05: qty 2

## 2020-03-05 NOTE — Progress Notes (Signed)
PT Cancellation Note  Patient Details Name: Larin Depaoli MRN: [id number] DOB: [birth year]   Cancelled Treatment:    Reason Eval/Treat Not Completed: PT screened, no needs identified, will sign off -- Occupational Therapist evaluated patient who is independent with mobility, requiring O2 to maintain saturations with activity. Acute PT will sign off. Please reconsult if new needs arise.  Ina Homes, PT, DPT Acute Rehabilitation Services  Pager [phone] Office [phone]  Malachy Chamber 03/05/2020, 9:02 AM

## 2020-03-05 NOTE — Progress Notes (Addendum)
-  [facility] unable to accept patient on oxygen. -[facility] requested CSW call back due to no one being in intake at the moment.  -Left voicemail for [facility].  -[facility] unable to take patients on oxygen. -[facility] does not have beds available.  Kuper Rennels LCSW

## 2020-03-05 NOTE — Consult Note (Addendum)
Cardiology Consultation:   Patient ID: Patricia Terrell; [id number]; 1940/07/06   Admit date: 02/24/2020 Date of Consult: 03/05/2020  Primary Care Provider: Pcp, No Primary Cardiologist: No primary care provider on file.  Primary Electrophysiologist:  None   Patient Profile:   Patricia Terrell is a 80 y.o. female with a hx of  COPD, HTN, Liver cyst, rhematic fever, PUD, s/p Covid vacc x 2, tob use, dementia, who is being seen today for the evaluation of atrial fib, at the request of Dr Marland Mcalpine.  Patricia Terrell previously lived in [place], moved here because her dementia had worsened, she was not paying her bills and was going to be evicted.  She came to [place] to be with her daughter.  History of Present Illness:   Patricia Terrell was admitted 01/21-01/24//2022 with SOB, +Covid, O2 sats 70%, worsening dementia. D/c on home O2.  Admitted 02/25/2020 with IVC, pt demonstrating aggressive behavior toward her daughter. Psych inpatient recommended.  She was awaiting placement to geri-psych and developed worsening SOB, O2 sats 70s on 02/26/2020. She was admitted to the general hospital for acute resp failure with hypoxia.   Dx w/ pneumonitis vs D-CHF, steroids added and pt given Lasix. EF nl w/ severe concentric LVH, grade 1 dd. Amlodipine and Imdur d/c'd, cont. BB and lisinopril.   ECG w/ T wave changes, but no CP, Dr Eldridge Dace reviewed and no STEMI. Monitor for sx.  S.W. continuing to work on placing pt, but no bed yet. Consideration being given to memory care ALF.  Cards asked to see for atrial fib.  Patricia Terrell has a long history of palpitations.  She states they are brief, and do not cause her any symptoms.  She has no history of presyncope or syncope.  She has no history of falls.  She states that she will feel her heart give a brief flutter, and then it will stop.  She cannot say how long it lasts exactly, or how often it happens.  She says that she has felt this intermittently since she had  rheumatic fever as a child.  She also complains of lower extremity edema which she says she has had ever since she had rheumatic fever.  She does not know if she wakes with it, or just gets it during the day.  However, she says it is a chronic problem.  It has not gotten any worse recently.  She denies any history of chest pain with exertion.  She denies orthopnea or PND.  It is 2001, and does not know the month or the date or the day of the week.  She does know that she is in a hospital and knows her name.    Past Medical History:  Diagnosis Date  . Emphysema lung (HCC)   . H/O: rheumatic fever   . HTN (hypertension)   . Liver cyst   . PUD (peptic ulcer disease)     Past Surgical History:  Procedure Laterality Date  . CESAREAN SECTION       Prior to Admission medications   Medication Sig Start Date End Date Taking? Authorizing Provider  famotidine (PEPCID) 20 MG tablet Take 1 tablet (20 mg total) by mouth daily for 14 days. 02/16/20 03/01/20 Yes Osvaldo Shipper, MD  lisinopril (ZESTRIL) 10 MG tablet Take 1 tablet (10 mg total) by mouth daily. 02/16/20  Yes Osvaldo Shipper, MD  mometasone-formoterol [facility]) 200-5 MCG/ACT AERO Inhale 2 puffs into the lungs 2 (two) times daily. 02/16/20  Yes Osvaldo Shipper, MD  predniSONE (DELTASONE) 20 MG tablet Take 3 tablets once daily for 3 days followed by 2 tablets once daily for 3 days followed by 1 tablet once daily for 3 days and then stop 02/16/20  Yes Osvaldo ShipperKrishnan, Gokul, MD  QUEtiapine (SEROQUEL) 25 MG tablet Take 1 tablet (25 mg total) by mouth at bedtime. 02/16/20  Yes Osvaldo ShipperKrishnan, Gokul, MD    Inpatient Medications: Scheduled Meds: . (feeding supplement) PROSource Plus  30 mL Oral BID BM  . enoxaparin (LOVENOX) injection  30 mg Subcutaneous Q24H  . famotidine  20 mg Oral Daily  . feeding supplement  237 mL Oral TID BM  . metoprolol tartrate  12.5 mg Oral BID  . mometasone-formoterol  2 puff Inhalation BID  . multivitamin with minerals  1  tablet Oral Daily  . nicotine  21 mg Transdermal Daily  . predniSONE  40 mg Oral Q breakfast  . QUEtiapine  25 mg Oral QHS   Continuous Infusions:  PRN Meds: acetaminophen, albuterol, alum & mag hydroxide-simeth, haloperidol lactate, ondansetron **OR** ondansetron (ZOFRAN) IV  Allergies:   No Known Allergies  Social History:   Social History   Socioeconomic History  . Marital status: Single    Spouse name: Not on file  . Number of children: Not on file  . Years of education: Not on file  . Highest education level: Not on file  Occupational History  . Not on file  Tobacco Use  . Smoking status: Current Every Day Smoker    Packs/day: 1.00    Years: 40.00    Pack years: 40.00    Types: Cigarettes  . Smokeless tobacco: Never Used  Substance and Sexual Activity  . Alcohol use: Not Currently  . Drug use: Not Currently  . Sexual activity: Not Currently  Other Topics Concern  . Not on file  Social History Narrative  . Not on file   Social Determinants of Health   Financial Resource Strain: Not on file  Food Insecurity: Not on file  Transportation Needs: Not on file  Physical Activity: Not on file  Stress: Not on file  Social Connections: Not on file  Intimate Partner Violence: Not on file    Family History:   Patricia Terrell states that her parents died in their 6350s, but she does not know what kind of medical problems they may have had. Family Status:  Family Status  Relation Name Status  . Mother  Deceased  . Father  Deceased    ROS:  Please see the history of present illness.  All other ROS reviewed and negative.     Physical Exam/Data:   Vitals:   03/04/20 1950 03/05/20 0500 03/05/20 1000 03/05/20 1330  BP: 100/69 101/68  (!) 105/57  Pulse: 70 60 63 63  Resp: 18 18  20   Temp: 98.4 F (36.9 C) 98.1 F (36.7 C)  98.2 F (36.8 C)  TempSrc: Oral Oral  Oral  SpO2: 95% 100% 92% 91%  Weight:      Height:        Intake/Output Summary (Last 24 hours) at  03/05/2020 1644 Last data filed at 03/05/2020 1342 Gross per 24 hour  Intake 1200 ml  Output --  Net 1200 ml    Last 3 Weights 02/26/2020 02/16/2020 02/14/2020  Weight (lbs) 105 lb 13.1 oz 105 lb 13.1 oz 106 lb 11.2 oz  Weight (kg) 48 kg 48 kg 48.4 kg     Body mass index is 17.61 kg/m.   General:  Well  nourished, well developed, female in no acute distress HEENT: normal Lymph: no adenopathy Neck: JVD -mildly elevated Endocrine:  No thryomegaly Vascular: No carotid bruits, murmur radiates to the carotids; 4/4 extremity pulses 2+  Cardiac:  normal S1, S2; RRR; 3/6 murmur Lungs: Scattered rales bilaterally, no wheezing, rhonchi  Abd: soft, nontender, no hepatomegaly  Ext: 1+ edema Musculoskeletal:  No deformities, BUE and BLE strength weak but equal Skin: warm and dry  Neuro:  CNs 2-12 intact, no focal abnormalities noted Psych:  Normal affect   EKG:  The EKG was personally reviewed and demonstrates:  02/13/2020 ECG is SR, HR 60, diffuse ST changes inferolateral leads Telemetry:  Telemetry was personally reviewed and demonstrates:  SR, Sinus brady at times, multiple atrial tach episodes, none lasting over 2". They are regular, do not believe atrial fib   CV studies:   ECHO: 02/26/2020 1. Severe concentric LVH with cavity obliteration of mid cavity and apex with systole consistent with hypertrophic cardiomyopathy Turbulent flow through LV/LVOT but no significant obstruction at rest. . Left ventricular ejection fraction, by estimation,  is 70 to 75%. The left ventricle has hyperdynamic function. The left ventricle has no regional wall motion abnormalities. Left ventricular diastolic parameters are consistent with Grade I diastolic dysfunction (impaired relaxation).  2. Right ventricular systolic function is normal. The right ventricular  size is normal. There is normal pulmonary artery systolic pressure.    Laboratory Data:   Chemistry Recent Labs  Lab 03/03/20 0026  03/04/20 0121 03/05/20 0158  NA 136 138 139  K 4.0 4.2 4.5  CL 90* 90* 92*  CO2 34* 38* 39*  GLUCOSE 124* 83 108*  BUN 52* 48* 47*  CREATININE 1.11* 1.36* 1.11*  CALCIUM 9.3 9.4 9.0  GFRNONAA 51* 40* 51*  ANIONGAP 12 10 8     Lab Results  Component Value Date   ALT 39 03/05/2020   AST 37 03/05/2020   ALKPHOS 36 (L) 03/05/2020   BILITOT 0.7 03/05/2020   Hematology Recent Labs  Lab 03/03/20 0026 03/04/20 0121 03/05/20 0158  WBC 8.1 8.1 6.9  RBC 4.18 4.07 4.28  HGB 12.0 12.0 12.7  HCT 38.9 37.3 39.6  MCV 93.1 91.6 92.5  MCH 28.7 29.5 29.7  MCHC 30.8 32.2 32.1  RDW 16.3* 16.4* 16.3*  PLT 232 237 213   Cardiac Enzymes High Sensitivity Troponin:   Recent Labs  Lab 02/13/20 1856 02/13/20 2104 02/26/20 0229  TROPONINIHS 7 6 16       BNPNo results for input(s): BNP, PROBNP in the last 168 hours.  DDimer No results for input(s): DDIMER in the last 168 hours. TSH: No results found for: TSH Lipids: Lab Results  Component Value Date   TRIG 45 02/26/2020   HgbA1c:No results found for: HGBA1C Magnesium:  Magnesium  Date Value Ref Range Status  03/05/2020 1.9 1.7 - 2.4 mg/dL Final    Comment:    Performed at [facility], [address] [place], [address] [place]     Radiology/Studies:  DG CHEST PORT 1 VIEW  Result Date: 03/05/2020 CLINICAL DATA:  80 year old female with shortness of breath. Emphysema. Calcified pleural disease. EXAM: PORTABLE CHEST 1 VIEW COMPARISON:  Portable chest 03/04/2020 and earlier. FINDINGS: Portable AP upright view at 0437 hours. Rotated to the right today. Calcified aortic atherosclerosis. Stable cardiomegaly and mediastinal contours. Coarse and calcified bilateral lung opacity is stable. No pneumothorax or pleural effusion identified. No areas of worsening ventilation allowing for patient rotation. No acute osseous abnormality identified.  IMPRESSION: 1. Chronic lung disease and cardiomegaly. 2.  No acute cardiopulmonary abnormality  identified. Electronically Signed   By: Odessa Fleming M.D.   On: 03/05/2020 06:43   DG CHEST PORT 1 VIEW  Result Date: 03/04/2020 CLINICAL DATA:  80 year old female with shortness of breath. EXAM: PORTABLE CHEST 1 VIEW COMPARISON:  Chest radiograph dated 02/26/2020. FINDINGS: Bilateral calcified pleural plaques. There is background of emphysema. No new consolidation. There is no pleural effusion pneumothorax. There is moderate cardiomegaly. Atherosclerotic calcification of the aorta. No acute osseous pathology. IMPRESSION: 1. No acute cardiopulmonary process. 2. Emphysema and calcified pleural plaques. Electronically Signed   By: Elgie Collard M.D.   On: 03/04/2020 17:32    Assessment and Plan:   1.  Possible atrial fibrillation: -MD to review telemetry.  -She has multiple small spikes over the last 4 days that indicate when she has had brief runs of tachycardia -By my measurement, they are regular, start and stop suddenly.  I believe this to be atrial tachycardia, not atrial fibrillation -They do not last long enough to be evaluated by twelve-lead ECG -Although she can feel the palpitations, they do not cause any significant symptoms -Continue beta-blocker if blood pressure and baseline heart rate will allow (has bradycardia at times) -No other evaluation indicated at this time, but will check a TSH  2.  Respiratory failure with hypoxia -Chest x-ray showed cardiomegaly and emphysema, but no acute cardiopulmonary process -Although she has mild lower extremity edema, no significant volume overload on exam -Management per IM  3.  Chronic diastolic CHF, HOCM: -Echo results above, she has HOCM. -Need to be careful not to get her too dry -Use beta-blockers as tolerated, could also use verapamil if needed  Otherwise, per IM Principal Problem:   Acute respiratory failure with hypoxia (HCC) Active Problems:   COPD (chronic obstructive pulmonary disease) (HCC)   HTN (hypertension)   Dementia  with behavioral disturbance (HCC)   Chronic diastolic CHF (congestive heart failure) (HCC)   Acute hypoxemic respiratory failure (HCC)     For questions or updates, please contact [facility] Please consult [email or web address] for contact info under Cardiology/STEMI.   Signed, Theodore Demark, PA-C  03/05/2020 4:44 PM  History and all data above reviewed.  Patient examined.   The patient is pleasant but very confused.  She has a history of RF she reports as a child but we have no past cardiac history. She cannot really provide any and isn't clear why she is hear.  She does talk about dyspnea but she does not report pain or palpitations.  She is being treated for Covid.  She has arrhythmia on telemetry.  She also has an echo consistent with hypertrophic cardiomyopathy.  She has leg swelling that is predominantly non pitting and she reports is chronic.   I agree with the findings as above.  The patient exam reveals COR:RRR, loud apical systolic murmur, unable to assess with Valsava  ,  Lungs: Few basilar crackles  ,  Abd: Positive bowel sounds, no rebound no guarding, Ext Mild non pitting edema.   .  All available labs, radiology testing, previous records reviewed. Agree with documented assessment and plan.   Arrhythmia:  At this point I would suggest that this is atrial tach and not atrial fib.  I would not suggest anticoagulation based on these strips.  HCM:  She seems to be euvolemic and we need to avoid over diuresis with cavity obliteration.  Her heart rate is slow so no  further negative chronotrope can be prescribed.   I think she can likely be managed conservatively with low dose beta blocker.  We will need to follow her over time now that she is moving from [place].    Fayrene Fearing Analeia Ismael  6:50 PM  03/05/2020

## 2020-03-05 NOTE — Evaluation (Signed)
Occupational Therapy Evaluation Patient Details Name: Patricia Terrell MRN: [id number] DOB: January 26, 1940 Today's Date: 03/05/2020    History of Present Illness Pt is a 80 y.o. female recently admitted 02/13/20 with COPD exacerbation and tested (+) COVID-19, now admitted 02/24/20 with AMS and homicidal ideation. Developed respiratory distress 2/3 requiring NRB; workup for acute hypoxic respiratory failure due to post-COVID pneumonitis vs. CHF. PMH includes dementia, COPD, HTN, multiple recent ED visits.   Clinical Impression   Pt admitted with the above diagnosis.  She currently is able to perform ADLs with supervision for safety due to cognitive deficits that appear to close to, or at her baseline.   She is able to perform functional mobility modified independently.  Sp02 with activity 84% on RA.  She was unable to recover without 2L supplemental 02 > 94%. Per chart, pt lives with daughter and was mod I with ADLs.  No further acute OT needs identified.  OT will sign off.     Follow Up Recommendations  No OT follow up;Other (comment) [facility] placement)    Equipment Recommendations  None recommended by OT    Recommendations for Other Services       Precautions / Restrictions Precautions Precautions: Fall      Mobility Bed Mobility Overal bed mobility: Independent                  Transfers Overall transfer level: Modified independent                    Balance Overall balance assessment: No apparent balance deficits (not formally assessed)                                         ADL either performed or assessed with clinical judgement   ADL Overall ADL's : Needs assistance/impaired Eating/Feeding: Independent   Grooming: Wash/dry hands;Wash/dry face;Oral care;Applying deodorant;Brushing hair;Supervision/safety;Standing   Upper Body Bathing: Set up;Supervision/ safety;Sitting   Lower Body Bathing: Supervison/ safety;Sit to/from stand   Upper  Body Dressing : Set up;Supervision/safety;Sitting   Lower Body Dressing: Supervision/safety;Sit to/from stand   Toilet Transfer: Supervision/safety;Ambulation;Comfort height toilet   Toileting- Clothing Manipulation and Hygiene: Supervision/safety         General ADL Comments: Pt requires supervision for safety     Vision Patient Visual Report: No change from baseline       Perception     Praxis      Pertinent Vitals/Pain Pain Assessment: No/denies pain     Hand Dominance Right   Extremity/Trunk Assessment Upper Extremity Assessment Upper Extremity Assessment: Generalized weakness;Overall [facility] for tasks assessed   Lower Extremity Assessment Lower Extremity Assessment: Overall WFL for tasks assessed   Cervical / Trunk Assessment Cervical / Trunk Assessment: Normal   Communication Communication Communication: No difficulties   Cognition Arousal/Alertness: Awake/alert Behavior During Therapy: Impulsive;Agitated;Restless Overall Cognitive Status: History of cognitive impairments - at baseline                                 General Comments: Pt is not oriented to place - thinks she is in [place], but she is aware that she is in the hospital and mentions COVID   General Comments  Sp02 84% on RA.  Pt unable to rebound without 2L supplemental 02 > 94%    Exercises  Shoulder Instructions      Home Living Family/patient expects to be discharged to:: Other (Comment) [facility] facility)                                        Prior Functioning/Environment          Comments: Pt unable to provide accurate info.  However, chart review reveals pt was mod I with ADLs and functional mobility        OT Problem List:        OT Treatment/Interventions:      OT Goals(Current goals can be found in the care plan section) Acute Rehab OT Goals Patient Stated Goal: go home OT Goal Formulation: All assessment and education complete, DC  therapy  OT Frequency:     Barriers to D/C:            Co-evaluation              AM-PAC OT "6 Clicks" Daily Activity     Outcome Measure Help from another person eating meals?: None Help from another person taking care of personal grooming?: A Little Help from another person toileting, which includes using toliet, bedpan, or urinal?: A Little Help from another person bathing (including washing, rinsing, drying)?: A Little Help from another person to put on and taking off regular upper body clothing?: A Little Help from another person to put on and taking off regular lower body clothing?: A Little 6 Click Score: 19   End of Session Equipment Utilized During Treatment: Oxygen Nurse Communication: Mobility status  Activity Tolerance: Patient tolerated treatment well Patient left: in chair;with call bell/phone within reach;with nursing/sitter in room  OT Visit Diagnosis: Cognitive communication deficit (O87.867)                Time: 6720-9470 OT Time Calculation (min): 30 min Charges:  OT General Charges $OT Visit: 1 Visit OT Evaluation $OT Eval Moderate Complexity: 1 Mod OT Treatments $Self Care/Home Management : 8-22 mins  Eber Jones., OTR/L Acute Rehabilitation Services Pager [phone] Office [phone]   Jeani Hawking M 03/05/2020, 10:51 AM
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
## 2020-03-05 NOTE — TOC Initial Note (Signed)
Transition of Care [facility]) - Initial/Assessment Note    Patient Details  Name: Patricia Terrell MRN: [id number] Date of Birth: Jul 08, 1940  Transition of [facility]) CM/SW Contact:    Mearl Latin, LCSW Phone Number: 03/05/2020, 3:09 PM  Clinical Narrative:                 CSW spoke with patient's daughter to provide an update. She stated that she was able to visit patient today and she was pleasant, though confused. Daughter reported that she does not want patient to go to an inpatient psych facility as she does not feel that would be good for patient. CSW will ask for a psych re-consult. CSW discussed other potential discharge plans such as home health (if nursing is required, can get an aide). Patient does not qualify for SNF. CSW explained that patient's daughter could apply for Medicaid at DSS for patient with special assistance to eventually get patient into a memory care ALF and she expressed appreciation for CSW to email her the resources ([email or web address]). CSW also emailed private duty aide list. Daughter reported she spoke with [facility] and patient could qualify for an aide at home; CSW explained we could explore home health if patient's discharge recommendations change. She reported that her daughter also assists with patient's care, though patient is ambulatory and only requires supervision and meals. Patient has home oxygen through [employer].   Expected Discharge Plan: Psychiatric Hospital Barriers to Discharge: Psych Bed not available   Patient Goals and CMS Choice Patient states their goals for this hospitalization and ongoing recovery are:: Stabalization CMS Medicare.gov Compare Post Acute Care list provided to:: Patient Represenative (must comment) Choice offered to / list presented to : Adult Children  Expected Discharge Plan and Services Expected Discharge Plan: Psychiatric Hospital In-house Referral: Clinical Social Work   Post Acute Care Choice: Home Health Living  arrangements for the past 2 months: Apartment                                      Prior Living Arrangements/Services Living arrangements for the past 2 months: Apartment Lives with:: Adult Children Patient language and need for interpreter reviewed:: Yes Do you feel safe going back to the place where you live?: Yes      Need for Family Participation in Patient Care: Yes (Comment) Care giver support system in place?: Yes (comment)   Criminal Activity/Legal Involvement Pertinent to Current Situation/Hospitalization: No - Comment as needed  Activities of Daily Living Home Assistive Devices/Equipment: Other (Comment) (Patient not cognitive to answer) ADL Screening (condition at time of admission) Patient's cognitive ability adequate to safely complete daily activities?: No Is the patient deaf or have difficulty hearing?: No Does the patient have difficulty seeing, even when wearing glasses/contacts?: No Does the patient have difficulty concentrating, remembering, or making decisions?: Yes Patient able to express need for assistance with ADLs?: No Does the patient have difficulty dressing or bathing?: Yes Independently performs ADLs?: No Communication: Independent Dressing (OT): Needs assistance Is this a change from baseline?: Pre-admission baseline Grooming: Independent Feeding: Independent Bathing: Needs assistance Is this a change from baseline?: Pre-admission baseline Toileting: Independent In/Out Bed: Independent Walks in Home: Independent Is this a change from baseline?: Pre-admission baseline Does the patient have difficulty walking or climbing stairs?: Yes Weakness of Legs: Both Weakness of Arms/Hands: None  Permission Sought/Granted Permission sought to share information with : Facility Contact Representative,Family  Supports Permission granted to share information with : No  Share Information with NAME: Tora Duck     Permission granted to share info w  Relationship: Daughter  Permission granted to share info w Contact Information: [phone]  Emotional Assessment   Attitude/Demeanor/Rapport: Unable to Assess Affect (typically observed): Unable to Assess Orientation: : Oriented to Self Alcohol / Substance Use: Not Applicable Psych Involvement: Yes (comment)  Admission diagnosis:  Acute respiratory failure with hypoxia (HCC) [J96.01] Dementia with aggressive behavior (HCC) [F03.91] COVID-19 [U07.1] Patient Active Problem List   Diagnosis Date Noted  . Acute respiratory failure with hypoxia (HCC) 02/26/2020  . Acute hypoxemic respiratory failure (HCC) 02/26/2020  . COPD (chronic obstructive pulmonary disease) (HCC) 02/13/2020  . HTN (hypertension) 02/13/2020  . PUD (peptic ulcer disease) 02/13/2020  . Dementia with behavioral disturbance (HCC) 02/13/2020  . Chronic diastolic CHF (congestive heart failure) (HCC) 02/13/2020   PCP:  Pcp, No Pharmacy:   [facility] [phone] - [place], [place] - [address] AT [facility] OF [address] & [address] [place] [address] Phone: [phone] Fax: [phone]     Social Determinants of Health (SDOH) Interventions    Readmission Risk Interventions No flowsheet data found.

## 2020-03-06 ENCOUNTER — Inpatient Hospital Stay (HOSPITAL_COMMUNITY): Payer: Medicare HMO

## 2020-03-06 DIAGNOSIS — J9601 Acute respiratory failure with hypoxia: Secondary | ICD-10-CM | POA: Diagnosis not present

## 2020-03-06 DIAGNOSIS — F0391 Unspecified dementia with behavioral disturbance: Secondary | ICD-10-CM | POA: Diagnosis not present

## 2020-03-06 DIAGNOSIS — I5032 Chronic diastolic (congestive) heart failure: Secondary | ICD-10-CM | POA: Diagnosis not present

## 2020-03-06 DIAGNOSIS — J449 Chronic obstructive pulmonary disease, unspecified: Secondary | ICD-10-CM | POA: Diagnosis not present

## 2020-03-06 LAB — COMPREHENSIVE METABOLIC PANEL
ALT: 57 U/L — ABNORMAL HIGH (ref 0–44)
AST: 55 U/L — ABNORMAL HIGH (ref 15–41)
Albumin: 2.3 g/dL — ABNORMAL LOW (ref 3.5–5.0)
Alkaline Phosphatase: 36 U/L — ABNORMAL LOW (ref 38–126)
Anion gap: 9 (ref 5–15)
BUN: 60 mg/dL — ABNORMAL HIGH (ref 8–23)
CO2: 37 mmol/L — ABNORMAL HIGH (ref 22–32)
Calcium: 9 mg/dL (ref 8.9–10.3)
Chloride: 91 mmol/L — ABNORMAL LOW (ref 98–111)
Creatinine, Ser: 1.19 mg/dL — ABNORMAL HIGH (ref 0.44–1.00)
GFR, Estimated: 47 mL/min — ABNORMAL LOW (ref >60)
Glucose, Bld: 112 mg/dL — ABNORMAL HIGH (ref 70–99)
Potassium: 4.6 mmol/L (ref 3.5–5.1)
Sodium: 137 mmol/L (ref 135–145)
Total Bilirubin: 0.7 mg/dL (ref 0.3–1.2)
Total Protein: 4.9 g/dL — ABNORMAL LOW (ref 6.5–8.1)

## 2020-03-06 LAB — CBC WITH DIFFERENTIAL/PLATELET
Abs Immature Granulocytes: 0.04 10*3/uL (ref 0.00–0.07)
Basophils Absolute: 0 10*3/uL (ref 0.0–0.1)
Basophils Relative: 0 %
Eosinophils Absolute: 0 10*3/uL (ref 0.0–0.5)
Eosinophils Relative: 0 %
HCT: 38.2 % (ref 36.0–46.0)
Hemoglobin: 11.9 g/dL — ABNORMAL LOW (ref 12.0–15.0)
Immature Granulocytes: 0 %
Lymphocytes Relative: 7 %
Lymphs Abs: 0.7 10*3/uL (ref 0.7–4.0)
MCH: 29.1 pg (ref 26.0–34.0)
MCHC: 31.2 g/dL (ref 30.0–36.0)
MCV: 93.4 fL (ref 80.0–100.0)
Monocytes Absolute: 0.3 10*3/uL (ref 0.1–1.0)
Monocytes Relative: 3 %
Neutro Abs: 8.9 10*3/uL — ABNORMAL HIGH (ref 1.7–7.7)
Neutrophils Relative %: 90 %
Platelets: 214 10*3/uL (ref 150–400)
RBC: 4.09 MIL/uL (ref 3.87–5.11)
RDW: 16.1 % — ABNORMAL HIGH (ref 11.5–15.5)
WBC: 10 10*3/uL (ref 4.0–10.5)
nRBC: 0 % (ref 0.0–0.2)

## 2020-03-06 LAB — TSH: TSH: 0.337 u[IU]/mL — ABNORMAL LOW (ref 0.350–4.500)

## 2020-03-06 LAB — MAGNESIUM: Magnesium: 2 mg/dL (ref 1.7–2.4)

## 2020-03-06 LAB — PHOSPHORUS: Phosphorus: 3.2 mg/dL (ref 2.5–4.6)

## 2020-03-06 LAB — BRAIN NATRIURETIC PEPTIDE: B Natriuretic Peptide: 918.7 pg/mL — ABNORMAL HIGH (ref 0.0–100.0)

## 2020-03-06 MED ORDER — POLYETHYLENE GLYCOL 3350 17 G PO PACK: 17.0000 g | PACK | Freq: Every day | ORAL | Status: DC | PRN

## 2020-03-06 MED ORDER — SENNOSIDES-DOCUSATE SODIUM 8.6-50 MG PO TABS
1.0000 | ORAL_TABLET | Freq: Every evening | ORAL | Status: DC | PRN
  Administered 2020-03-06: 1 via ORAL
  Filled 2020-03-06: qty 1

## 2020-03-06 NOTE — Progress Notes (Signed)
Progress Note  Patient Name: Patricia Terrell Date of Encounter: 03/06/2020  Primary Cardiologist:   No primary care provider on file.   Subjective   Confused.  Pleasant and no pain.  Maybe mild SOB.   Inpatient Medications    Scheduled Meds: . (feeding supplement) PROSource Plus  30 mL Oral BID BM  . enoxaparin (LOVENOX) injection  30 mg Subcutaneous Q24H  . famotidine  20 mg Oral Daily  . feeding supplement  237 mL Oral TID BM  . metoprolol tartrate  12.5 mg Oral BID  . mometasone-formoterol  2 puff Inhalation BID  . multivitamin with minerals  1 tablet Oral Daily  . nicotine  21 mg Transdermal Daily  . predniSONE  40 mg Oral Q breakfast  . QUEtiapine  25 mg Oral QHS   Continuous Infusions:  PRN Meds: acetaminophen, albuterol, alum & mag hydroxide-simeth, haloperidol lactate, ondansetron **OR** ondansetron (ZOFRAN) IV   Vital Signs    Vitals:   03/05/20 2229 03/06/20 0021 03/06/20 0600 03/06/20 0900  BP: (!) 87/61 102/67 (!) 96/57   Pulse:    70  Resp:  18 18   Temp: 98.6 F (37 C) 98.6 F (37 C) (!) 97.4 F (36.3 C)   TempSrc: Oral Oral Oral   SpO2:    95%  Weight:      Height:        Intake/Output Summary (Last 24 hours) at 03/06/2020 1211 Last data filed at 03/06/2020 3295 Gross per 24 hour  Intake 1200 ml  Output -  Net 1200 ml   Filed Weights   02/26/20 1100  Weight: 48 kg    Telemetry    NSR - Personally Reviewed  ECG    NA - Personally Reviewed  Physical Exam   GEN: No acute distress.   Neck: No  JVD Cardiac: RRR, loud systolic murmur, no diastolic murmurs, rubs, or gallops.  Respiratory:    Mildly decreased breath sounds.  GI: Soft, nontender, non-distended  Patricia:    Mild non pitting edema; No deformity. Neuro:  Nonfocal  Psych: Normal affect   Labs    Chemistry Recent Labs  Lab 03/04/20 0121 03/05/20 0158 03/06/20 0249  NA 138 139 137  K 4.2 4.5 4.6  CL 90* 92* 91*  CO2 38* 39* 37*  GLUCOSE 83 108* 112*  BUN 48* 47*  60*  CREATININE 1.36* 1.11* 1.19*  CALCIUM 9.4 9.0 9.0  PROT 5.0* 5.3* 4.9*  ALBUMIN 2.3* 2.4* 2.3*  AST 31 37 55*  ALT 35 39 57*  ALKPHOS 34* 36* 36*  BILITOT 0.9 0.7 0.7  GFRNONAA 40* 51* 47*  ANIONGAP 10 8 9      Hematology Recent Labs  Lab 03/04/20 0121 03/05/20 0158 03/06/20 0249  WBC 8.1 6.9 10.0  RBC 4.07 4.28 4.09  HGB 12.0 12.7 11.9*  HCT 37.3 39.6 38.2  MCV 91.6 92.5 93.4  MCH 29.5 29.7 29.1  MCHC 32.2 32.1 31.2  RDW 16.4* 16.3* 16.1*  PLT 237 213 214    Cardiac EnzymesNo results for input(s): TROPONINI in the last 168 hours. No results for input(s): TROPIPOC in the last 168 hours.   BNP Recent Labs  Lab 03/06/20 0249  BNP 918.7*     DDimer No results for input(s): DDIMER in the last 168 hours.   Radiology    DG CHEST PORT 1 VIEW  Result Date: 03/06/2020 CLINICAL DATA:  Shortness of breath.  COVID-19 positive EXAM: PORTABLE CHEST 1 VIEW COMPARISON:  March 05, 2020 FINDINGS: Multiple calcified granulomas are noted throughout the lungs. There is atelectasis in the lung bases. There is an equivocal small right pleural effusion. Heart is enlarged with pulmonary vascularity normal. There is aortic atherosclerosis. No bone lesions. IMPRESSION: Bibasilar atelectasis with equivocal right pleural effusion. No consolidation. Multiple calcified granulomas throughout the lungs. Stable cardiomegaly. Aortic Atherosclerosis (ICD10-I70.0). Electronically Signed   By: Bretta Bang III M.D.   On: 03/06/2020 09:27   DG CHEST PORT 1 VIEW  Result Date: 03/05/2020 CLINICAL DATA:  80 year old female with shortness of breath. Emphysema. Calcified pleural disease. EXAM: PORTABLE CHEST 1 VIEW COMPARISON:  Portable chest 03/04/2020 and earlier. FINDINGS: Portable AP upright view at 0437 hours. Rotated to the right today. Calcified aortic atherosclerosis. Stable cardiomegaly and mediastinal contours. Coarse and calcified bilateral lung opacity is stable. No pneumothorax or  pleural effusion identified. No areas of worsening ventilation allowing for patient rotation. No acute osseous abnormality identified. IMPRESSION: 1. Chronic lung disease and cardiomegaly. 2.  No acute cardiopulmonary abnormality identified. Electronically Signed   By: Odessa Fleming M.D.   On: 03/05/2020 06:43   DG CHEST PORT 1 VIEW  Result Date: 03/04/2020 CLINICAL DATA:  80 year old female with shortness of breath. EXAM: PORTABLE CHEST 1 VIEW COMPARISON:  Chest radiograph dated 02/26/2020. FINDINGS: Bilateral calcified pleural plaques. There is background of emphysema. No new consolidation. There is no pleural effusion pneumothorax. There is moderate cardiomegaly. Atherosclerotic calcification of the aorta. No acute osseous pathology. IMPRESSION: 1. No acute cardiopulmonary process. 2. Emphysema and calcified pleural plaques. Electronically Signed   By: Elgie Collard M.D.   On: 03/04/2020 17:32    Cardiac Studies   ECHO:    1. Severe concentric LVH with cavity obliteration of mid cavity and apex  with systole consistent with hypertrophic cardiomyopathy Turbulent flow  through LV/LVOT but no significant obstruction at rest. . Left ventricular  ejection fraction, by estimation,  is 70 to 75%. The left ventricle has hyperdynamic function. The left  ventricle has no regional wall motion abnormalities. Left ventricular  diastolic parameters are consistent with Grade I diastolic dysfunction  (impaired relaxation).  2. Right ventricular systolic function is normal. The right ventricular  size is normal. There is normal pulmonary artery systolic pressure.  3. A small pericardial effusion is present.  4. The mitral valve is abnormal.  5. The inferior vena cava is normal in size with greater than 50%  respiratory variability, suggesting right atrial pressure of 3 mmHg.   Patient Profile     80 y.o. female with a hx of COPD, HTN, Liver cyst, rhematic fever, PUD, s/p Covid vacc x 2, tob use,  dementia, who is being seen today for the evaluation of atrial fib, at the request of Dr Patricia Terrell.  Patricia Terrell previously lived in [place], moved here because her dementia had worsened, she was not paying her bills and was going to be evicted.  She came to [place] to be with her daughter.  Assessment & Plan    Arrhythmia:    NSR.  No evidence of atrial fib.  No change in therapy.  No indication for anticoagulation.    AM beta blocker was held secondary to low BP.  No change  We will continue to try to administer this.  She is not symptomatic with low BP. Likely can be discharged on the dose as listed.   HCM:    Seems to be euvolemic.  No indication for further diuresis (despite the elevated BNP).  Continue low dose beta blocker as tolerated.   For questions or updates, please contact [facility] Please consult [email or web address] for contact info under Cardiology/STEMI.   Signed, Rollene Rotunda, MD  03/06/2020, 12:11 PM

## 2020-03-06 NOTE — Progress Notes (Signed)
PROGRESS NOTE    Patricia Terrell  URK:[id number] DOB: 09/21/1940 DOA: 02/24/2020 PCP: Pcp, No   Brief Narrative:  The patient is a 80 year old African-American female with past medical history significant for but not limited to dementia, COPD/emphysema, hypertension as well as other comorbidities was admitted from 02/13/20-02/16/20 for COPD exacerbation and was found to be Covid positive at that time.  A CAT scan during admission was negative for PE but also was negative for multifocal pneumonia.  She received 3 days of remdesivir and was recommended to have 10 days of isolation which she is currently completed.  She was discharged home however she became more confused, belligerent and was Threatening to kill her daughter and was IVC on February 1 and brought back to the ED.  On 02/26/2020 she is noted to have respiratory distress requiring nonrebreather.  Chest x-ray showed patchy airspace opacity superimposed on chronic calcified pleural plaques.  BNP was elevated and thought to have a component of acute on chronic diastolic CHF and Lasix was given and she had been made to the hospital.  She was on room air and had improved significantly but is now back on O2 via N.  Her mentation is better but she continues to remain confused.  She was admitted for acute hypoxic respiratory failure possibly post Covid pneumonitis versus acute on chronic diastolic CHF.  She had been diuresed and had been deemed medically stable for discharge to [facility] psych facility but now remains intermittently on O2 and today desaturated without it. CXR today showed "Chronic lung disease and cardiomegaly. No acute cardiopulmonary abnormality identified." Will give a dose of Lasix 20 mg x1 and Cardiology was consulted for further evaluation.  Cardiology evaluated and she has HCM and now appears euvolemic and Cardiology recommending avoiding overdiuresis and recommending continue Beta Blocker.Cardiology evaluated her Rhythm and is in NSR and had  no evidence of Atrial Fib and recommending continuing Beta Blocker if possible. For her HCM they feel that she is euvolemic and recommend no further diuresis despite elevated BNP.  Assessment & Plan:   Principal Problem:   Acute respiratory failure with hypoxia (HCC) Active Problems:   COPD (chronic obstructive pulmonary disease) (HCC)   HTN (hypertension)   Dementia with behavioral disturbance (HCC)   Chronic diastolic CHF (congestive heart failure) (HCC)   Acute hypoxemic respiratory failure (HCC)  Acute hypoxic respiratory failure, possibly post Covid pneumonitis with likely concomitant acute on chronic Grade 1 Diastolic CHF -It is likely that her respiratory failure was caused by fluid overload mainly.  Daughter tells me that patient has been noncompliant with her medications at home, and when she was in the ED for couple of days in psych hold she received all her home medications, possibly causing hypotension which can precipitate heart failure given her significant hypertrophic cardiomyopathy.  She was given Lasix and was weaned to room air but back on Supplemental O2 and desaturates without it  -SpO2: 95 % O2 Flow Rate (L/min): 2 L/min -Post Covid Covid pneumonitis is also a possibility and she has been placed on steroids.  Will do limited course for max 10 days; she is on prednisone 40 mg p.o. daily that was started on 02/27/2020 -2D echo done showed severe concentric LVH consistent with hypertrophic cardiomyopathy but no significant obstruction at rest, LVEF 70-75% and hyperdynamic function.  It also showed grade 1 diastolic dysfunction.  -Will repeat a CXR and showed "Chronic lung disease and cardiomegaly. No acute cardiopulmonary abnormality identified." -Continue with Dulera 2 puffs  IH twice daily and with albuterol 2 puffs IH -Continue TED hoses and elevation of the lower extremities for chronic edema -Will give a dose of IV Lasix 20 mg x1 today -Patient is +8.199 Liters Positive  and Weights not done -Daily weights  -Continue to monitor For S/Sx of Volume overload -Cardiology consulted and they feel she is Euvolemic and recommend no further diuresis -Recommending continuing Beta Blocker   Hypertrophic cardiomyopathy, severe concentric LVH, grade 1 diastolic dysfunction -Patient does not have a prior echo in the system. -Stop amlodipine, Imdur as this can worsen heart failure symptoms.  She seems to be tolerating beta-blockers but was bradycardic today and will need to watch carefully.  Added back on low-dose lisinopril and HCTZ given hypertension but will to he held given hypotension and slightly worsening renal fxn.  Blood pressure better -Last BP was 99/70 -Was given a dose IV Lasix yesterday  -Cardiology consulted for further evaluation and recommending no further diuresis despite elevated BNP at 918.7 -They recommend Beta Blocker as Tolerated  CKD stage IIIa -Chronic.  BUN/creatinine has gone from 52/1.11 -> 48/1.36 -> 47/1.11 -> 60/1.19 -avoid further nephrotoxic medications (lisinopril and HCTZ had been resumed but will hold again), contrast dyes, hypotension renally dose medications -repeat CMP in a.m.  Tobacco Abuse  -Smoking cessation counseling -Continue with nicotine patch 21 mg transdermally every 24 hours  Essential Hypertension -Held Lisinopril.  Her amlodipine and Imdur were discontinued due to her hypertrophic cardiomyopathy.  Blood pressure better but yesterday she was bradycardic and may need to hold her beta-blocker as well and observe; Held BB this Am because of Hypotension -Continue to Monitor BP per Protocol -Last BP was 99/70  Dementia, behavioral disturbances -Consulted psych as well as appreciate reevaluation and follow-up.  She was IVC by family due to homicidal ideation as she was threatening the daughter to kill her in her sleep.  Renewed IVC last week.  She was evaluated by psychiatry who recommended Geri psych; social work is  attempting to locate a Geri psych facility bed but unable to procure placement currently. Will get Psych to re-evaluate  -Continue with Quetiapine 25 mg p.o. nightly -Remains confused but pleasant -Had to be given Haldol Lactate last night and is now on 2 mg IV q6hprn  -Repeat Psych Evaluation pending  Abnormal LFT's -AST went from 31 -> 37 -> 55 -ALT went from 35 -> 39 -> 57 -Likely from above -Continue to Monitor and Trend and if worsening will obtain RUQ U/S and Acute Hepatitis Panel -Repeat CMP in the AM  Abnormal EKG/Arrythmias -Patient had an EKG which showed T wave inversions and it was reviewed with Dr. Eldridge Dace who felt that since the patient was asymptomatic no further cardiac work-up was needed. -Cardiology consulted and appreciate further assistance  -Cardiology recommends continuing BB and saw no Evidence of Atrial Fibrillation   Underweight -Continue with Prosource +30 mmol p.o. twice daily as well as Ensure alive 237 mL p.o. 3 times daily between meals and multivitamin with minerals 1 tab p.o. daily  GERD -Continue with Famotidine 20 mg p.o. daily  Goals of care: DNR POA  DVT prophylaxis: Heparin 30 mg subcu every 24 Code Status: DO NOT RESUSCITATE Family Communication: No family present at bedside Disposition Plan: Pending discharge to [facility] psych facility but no bed placement however will get Repeat Psych Evaluation   Status is: Inpatient  Remains inpatient appropriate because:Unsafe d/c plan, IV treatments appropriate due to intensity of illness or inability to take PO  and Inpatient level of care appropriate due to severity of illness   Dispo: The patient is from: Home              Anticipated d/c is to: [employer]  vs Going Home with Home Health               Anticipated d/c date is: 1 day              Patient currently is medically stable to d/c.   Difficult to place patient No  Consultants:   Psychiatry   Procedures: None  Antimicrobials:   Anti-infectives (From admission, onward)   None        Subjective: Seen and examined at bedside he remains on oxygen but is doing fairly well.  Thinks her lower extremity swelling is chronic.  Denies any chest pain, shortness of breath.  No other concerns or complaints at this time. Cardiology has cleared her and awaiting Psych re-evaluation.   Objective: Vitals:   03/06/20 0021 03/06/20 0600 03/06/20 0900 03/06/20 0905  BP: 102/67 (!) 96/57  99/70  Pulse:   70   Resp: 18 18    Temp: 98.6 F (37 C) (!) 97.4 F (36.3 C)    TempSrc: Oral Oral    SpO2:   95%   Weight:      Height:        Intake/Output Summary (Last 24 hours) at 03/06/2020 1355 Last data filed at 03/06/2020 1328 Gross per 24 hour  Intake 1300 ml  Output --  Net 1300 ml   Filed Weights   02/26/20 1100  Weight: 48 kg   Examination: Physical Exam:  Constitutional: The patient is a thin AAF in NAD and appears calm and comfortable but is still slightly confused Eyes: Lids and conjunctivae normal, sclerae anicteric  ENMT: External Ears, Nose appear normal. Grossly normal hearing Neck: Appears normal, supple, no cervical masses, normal ROM, no appreciable thyromegaly; no JVD Respiratory: Diminished to auscultation bilaterally with coarse breath sounds, no wheezing, rales, rhonchi or crackles. Normal respiratory effort and patient is not tachypenic. No accessory muscle use. Wearing supplemental O2 via [place] Cardiovascular: RRR, no murmurs / rubs / gallops. S1 and S2 auscultated. No extremity edema. Abdomen: Soft, non-tender, non-distended. Bowel sounds positive.  GU: Deferred. Musculoskeletal: No clubbing / cyanosis of digits/nails. No joint deformity upper and lower extremities.  Skin: No rashes, lesions, ulcers on a limited skin evaluation. No induration; Warm and dry.  Neurologic: CN 2-12 grossly intact with no focal deficits. Romberg sign and cerebellar reflexes not assessed.  Psychiatric: Mildly impaired  judgment and insight. Alert and oriented x 2. Normal mood and appropriate affect.   Data Reviewed: I have personally reviewed following labs and imaging studies  CBC: Recent Labs  Lab 03/01/20 0423 03/03/20 0026 03/04/20 0121 03/05/20 0158 03/06/20 0249  WBC 7.6 8.1 8.1 6.9 10.0  NEUTROABS  --   --  7.1 6.1 8.9*  HGB 11.9* 12.0 12.0 12.7 11.9*  HCT 36.9 38.9 37.3 39.6 38.2  MCV 91.6 93.1 91.6 92.5 93.4  PLT 219 232 237 213 214   Basic Metabolic Panel: Recent Labs  Lab 03/01/20 0423 03/02/20 1307 03/03/20 0026 03/04/20 0121 03/05/20 0158 03/06/20 0249  NA 138 138 136 138 139 137  K 5.2* 4.1 4.0 4.2 4.5 4.6  CL 91* 89* 90* 90* 92* 91*  CO2 36* 38* 34* 38* 39* 37*  GLUCOSE 89 101* 124* 83 108* 112*  BUN 40* 42* 52*  48* 47* 60*  CREATININE 1.22* 0.99 1.11* 1.36* 1.11* 1.19*  CALCIUM 9.2 9.6 9.3 9.4 9.0 9.0  MG 1.9  --   --  1.9 1.9 2.0  PHOS  --   --   --  3.0 3.4 3.2   GFR: Estimated Creatinine Clearance: 29 mL/min (A) (by C-G formula based on SCr of 1.19 mg/dL (H)). Liver Function Tests: Recent Labs  Lab 03/04/20 0121 03/05/20 0158 03/06/20 0249  AST 31 37 55*  ALT 35 39 57*  ALKPHOS 34* 36* 36*  BILITOT 0.9 0.7 0.7  PROT 5.0* 5.3* 4.9*  ALBUMIN 2.3* 2.4* 2.3*   No results for input(s): LIPASE, AMYLASE in the last 168 hours. No results for input(s): AMMONIA in the last 168 hours. Coagulation Profile: No results for input(s): INR, PROTIME in the last 168 hours. Cardiac Enzymes: No results for input(s): CKTOTAL, CKMB, CKMBINDEX, TROPONINI in the last 168 hours. BNP (last 3 results) No results for input(s): PROBNP in the last 8760 hours. HbA1C: No results for input(s): HGBA1C in the last 72 hours. CBG: No results for input(s): GLUCAP in the last 168 hours. Lipid Profile: No results for input(s): CHOL, HDL, LDLCALC, TRIG, CHOLHDL, LDLDIRECT in the last 72 hours. Thyroid Function Tests: Recent Labs    03/06/20 0249  TSH 0.337*   Anemia Panel: No  results for input(s): VITAMINB12, FOLATE, FERRITIN, TIBC, IRON, RETICCTPCT in the last 72 hours. Sepsis Labs: No results for input(s): PROCALCITON, LATICACIDVEN in the last 168 hours.  Recent Results (from the past 240 hour(s))  Blood Culture (routine x 2)     Status: None   Collection Time: 02/26/20  3:00 AM   Specimen: BLOOD LEFT FOREARM  Result Value Ref Range Status   Specimen Description BLOOD LEFT FOREARM  Final   Special Requests   Final    BOTTLES DRAWN AEROBIC AND ANAEROBIC Blood Culture adequate volume   Culture   Final    NO GROWTH 5 DAYS Performed at [facility], [address] [place], [place] [address]    Report Status 03/02/2020 FINAL  Final  Blood Culture (routine x 2)     Status: None   Collection Time: 02/26/20  3:20 AM   Specimen: BLOOD RIGHT FOREARM  Result Value Ref Range Status   Specimen Description BLOOD RIGHT FOREARM  Final   Special Requests   Final    BOTTLES DRAWN AEROBIC AND ANAEROBIC Blood Culture adequate volume   Culture   Final    NO GROWTH 5 DAYS Performed at [facility], [address] [place], [place] [address]    Report Status 03/02/2020 FINAL  Final     RN Pressure Injury Documentation:     Estimated body mass index is 17.61 kg/m as calculated from the following:   Height as of this encounter: 5\' 5"  (1.651 m).   Weight as of this encounter: 48 kg.  Malnutrition Type:  Nutrition Problem: Increased nutrient needs Etiology: chronic illness (COPD, CHF)  Malnutrition Characteristics:  Signs/Symptoms: estimated needs  Nutrition Interventions:  Interventions: Ensure Enlive (each supplement provides 350kcal and 20 grams of protein),MVI,Prostat   Radiology Studies: DG CHEST PORT 1 VIEW  Result Date: 03/06/2020 CLINICAL DATA:  Shortness of breath.  COVID-19 positive EXAM: PORTABLE CHEST 1 VIEW COMPARISON:  March 05, 2020 FINDINGS: Multiple calcified granulomas are noted throughout the lungs. There is atelectasis in  the lung bases. There is an equivocal small right pleural effusion. Heart is enlarged with pulmonary vascularity normal.  There is aortic atherosclerosis. No bone lesions. IMPRESSION: Bibasilar atelectasis with equivocal right pleural effusion. No consolidation. Multiple calcified granulomas throughout the lungs. Stable cardiomegaly. Aortic Atherosclerosis (ICD10-I70.0). Electronically Signed   By: Bretta Bang III M.D.   On: 03/06/2020 09:27   DG CHEST PORT 1 VIEW  Result Date: 03/05/2020 CLINICAL DATA:  80 year old female with shortness of breath. Emphysema. Calcified pleural disease. EXAM: PORTABLE CHEST 1 VIEW COMPARISON:  Portable chest 03/04/2020 and earlier. FINDINGS: Portable AP upright view at 0437 hours. Rotated to the right today. Calcified aortic atherosclerosis. Stable cardiomegaly and mediastinal contours. Coarse and calcified bilateral lung opacity is stable. No pneumothorax or pleural effusion identified. No areas of worsening ventilation allowing for patient rotation. No acute osseous abnormality identified. IMPRESSION: 1. Chronic lung disease and cardiomegaly. 2.  No acute cardiopulmonary abnormality identified. Electronically Signed   By: Odessa Fleming M.D.   On: 03/05/2020 06:43   DG CHEST PORT 1 VIEW  Result Date: 03/04/2020 CLINICAL DATA:  80 year old female with shortness of breath. EXAM: PORTABLE CHEST 1 VIEW COMPARISON:  Chest radiograph dated 02/26/2020. FINDINGS: Bilateral calcified pleural plaques. There is background of emphysema. No new consolidation. There is no pleural effusion pneumothorax. There is moderate cardiomegaly. Atherosclerotic calcification of the aorta. No acute osseous pathology. IMPRESSION: 1. No acute cardiopulmonary process. 2. Emphysema and calcified pleural plaques. Electronically Signed   By: Elgie Collard M.D.   On: 03/04/2020 17:32   Scheduled Meds: . (feeding supplement) PROSource Plus  30 mL Oral BID BM  . enoxaparin (LOVENOX) injection  30 mg  Subcutaneous Q24H  . famotidine  20 mg Oral Daily  . feeding supplement  237 mL Oral TID BM  . metoprolol tartrate  12.5 mg Oral BID  . mometasone-formoterol  2 puff Inhalation BID  . multivitamin with minerals  1 tablet Oral Daily  . nicotine  21 mg Transdermal Daily  . predniSONE  40 mg Oral Q breakfast  . QUEtiapine  25 mg Oral QHS   Continuous Infusions:   LOS: 9 days   Merlene Laughter, DO [facility] PAGER is on AMION  If 7PM-7AM, please contact night-coverage [email or web address]

## 2020-03-06 NOTE — Consult Note (Signed)
[facility] Face-to-Face Psychiatry Consult   Reason for Consult: ''Re-consult for GeriPsych Recommendations.''  Referring Physician:  Merlene Laughter, DO Patient Identification: Patricia Terrell MRN:  [id number] Principal Diagnosis: Dementia with behavioral disturbance [facility]) Diagnosis:  Principal Problem:   Dementia with behavioral disturbance (HCC) Active Problems:   COPD (chronic obstructive pulmonary disease) (HCC)   HTN (hypertension)   Chronic diastolic CHF (congestive heart failure) (HCC)   Acute respiratory failure with hypoxia (HCC)   Acute hypoxemic respiratory failure (HCC)   Total Time spent with patient: 45 minutes  Subjective:   Patricia Terrell is a 80 y.o. female patient admitted due to aggressive behavior and homicidal thoughts.''  HPI:  Patient is a 80 y.o. female with a history of dementia, emphysema, rheumatic fever, HTN, peptic ulcer disease, and COVID-19 (positive test on 02/13/20) who was admitted to the hospital under IVC due to aggressive behavior and making threats to kill her daughter with a knife while sleeping. Patient is alert and only oriented to place and person not time. She reports that she has been struggling with her memory but vehemently states that she is capable of taking care of herself but angrily reports that her daughter has been trying to take over her life. Per chart review and collateral information, patient was getting evicted after she stopped paying rent. Patient states that she want an independent life , no longer wants to live with her daughter again due to recurrent arguments. Patient reports that she threatened to hurt her daughter because she has the feeling the daughter was going to hurt her. She accuses her daughter of trying to take her belonging including her money.Patient states that she has no intention of returning home to her daughter or willing to move to the nursing home. Today, patient is unable to contract for safety.  Past Psychiatric  History: as above  Risk to Self:  denies  Risk to Others:  her daughter Prior Inpatient Therapy:   Prior Outpatient Therapy:    Past Medical History:  Past Medical History:  Diagnosis Date  . Emphysema lung (HCC)   . H/O: rheumatic fever   . HTN (hypertension)   . Liver cyst   . PUD (peptic ulcer disease)     Past Surgical History:  Procedure Laterality Date  . CESAREAN SECTION     Family History: History reviewed. No pertinent family history. Family Psychiatric  History:  Social History:  Social History   Substance and Sexual Activity  Alcohol Use Not Currently     Social History   Substance and Sexual Activity  Drug Use Not Currently    Social History   Socioeconomic History  . Marital status: Single    Spouse name: Not on file  . Number of children: Not on file  . Years of education: Not on file  . Highest education level: Not on file  Occupational History  . Not on file  Tobacco Use  . Smoking status: Current Every Day Smoker    Packs/day: 1.00    Years: 40.00    Pack years: 40.00    Types: Cigarettes  . Smokeless tobacco: Never Used  Substance and Sexual Activity  . Alcohol use: Not Currently  . Drug use: Not Currently  . Sexual activity: Not Currently  Other Topics Concern  . Not on file  Social History Narrative  . Not on file   Social Determinants of Health   Financial Resource Strain: Not on file  Food Insecurity: Not on file  Transportation  Needs: Not on file  Physical Activity: Not on file  Stress: Not on file  Social Connections: Not on file   Additional Social History:    Allergies:  No Known Allergies  Labs:  Results for orders placed or performed during the hospital encounter of 02/24/20 (from the past 48 hour(s))  CBC with Differential/Platelet     Status: Abnormal   Collection Time: 03/05/20  1:58 AM  Result Value Ref Range   WBC 6.9 4.0 - 10.5 K/uL   RBC 4.28 3.87 - 5.11 MIL/uL   Hemoglobin 12.7 12.0 - 15.0 g/dL   HCT  25.4 27.0 - 62.3 %   MCV 92.5 80.0 - 100.0 fL   MCH 29.7 26.0 - 34.0 pg   MCHC 32.1 30.0 - 36.0 g/dL   RDW 76.2 (H) 83.1 - 51.7 %   Platelets 213 150 - 400 K/uL   nRBC 0.0 0.0 - 0.2 %   Neutrophils Relative % 89 %   Neutro Abs 6.1 1.7 - 7.7 K/uL   Lymphocytes Relative 8 %   Lymphs Abs 0.6 (L) 0.7 - 4.0 K/uL   Monocytes Relative 3 %   Monocytes Absolute 0.2 0.1 - 1.0 K/uL   Eosinophils Relative 0 %   Eosinophils Absolute 0.0 0.0 - 0.5 K/uL   Basophils Relative 0 %   Basophils Absolute 0.0 0.0 - 0.1 K/uL   Immature Granulocytes 0 %   Abs Immature Granulocytes 0.02 0.00 - 0.07 K/uL    Comment: Performed at [facility], [address], [place], [place] [address]  Comprehensive metabolic panel     Status: Abnormal   Collection Time: 03/05/20  1:58 AM  Result Value Ref Range   Sodium 139 135 - 145 mmol/L   Potassium 4.5 3.5 - 5.1 mmol/L   Chloride 92 (L) 98 - 111 mmol/L   CO2 39 (H) 22 - 32 mmol/L   Glucose, Bld 108 (H) 70 - 99 mg/dL    Comment: Glucose reference range applies only to samples taken after fasting for at least 8 hours.   BUN 47 (H) 8 - 23 mg/dL   Creatinine, Ser 3.71 (H) 0.44 - 1.00 mg/dL   Calcium 9.0 8.9 - 06.2 mg/dL   Total Protein 5.3 (L) 6.5 - 8.1 g/dL   Albumin 2.4 (L) 3.5 - 5.0 g/dL   AST 37 15 - 41 U/L   ALT 39 0 - 44 U/L   Alkaline Phosphatase 36 (L) 38 - 126 U/L   Total Bilirubin 0.7 0.3 - 1.2 mg/dL   GFR, Estimated 51 (L) >60 mL/min    Comment: (NOTE) Calculated using the CKD-EPI Creatinine Equation (2021)    Anion gap 8 5 - 15    Comment: Performed at [facility], [address] [place], [place] [address]  Phosphorus     Status: None   Collection Time: 03/05/20  1:58 AM  Result Value Ref Range   Phosphorus 3.4 2.5 - 4.6 mg/dL    Comment: Performed at [facility], [address], [place], [place] [address]  Magnesium     Status: None   Collection Time: 03/05/20  1:58 AM  Result Value Ref Range   Magnesium 1.9 1.7 - 2.4  mg/dL    Comment: Performed at [facility], [address] [place], [place] [address]  Comprehensive metabolic panel     Status: Abnormal   Collection Time: 03/06/20  2:49 AM  Result Value Ref Range  Sodium 137 135 - 145 mmol/L   Potassium 4.6 3.5 - 5.1 mmol/L   Chloride 91 (L) 98 - 111 mmol/L   CO2 37 (H) 22 - 32 mmol/L   Glucose, Bld 112 (H) 70 - 99 mg/dL    Comment: Glucose reference range applies only to samples taken after fasting for at least 8 hours.   BUN 60 (H) 8 - 23 mg/dL   Creatinine, Ser 5.17 (H) 0.44 - 1.00 mg/dL   Calcium 9.0 8.9 - 00.1 mg/dL   Total Protein 4.9 (L) 6.5 - 8.1 g/dL   Albumin 2.3 (L) 3.5 - 5.0 g/dL   AST 55 (H) 15 - 41 U/L   ALT 57 (H) 0 - 44 U/L   Alkaline Phosphatase 36 (L) 38 - 126 U/L   Total Bilirubin 0.7 0.3 - 1.2 mg/dL   GFR, Estimated 47 (L) >60 mL/min    Comment: (NOTE) Calculated using the CKD-EPI Creatinine Equation (2021)    Anion gap 9 5 - 15    Comment: Performed at [facility], [address], [place], [place] [address]  CBC with Differential/Platelet     Status: Abnormal   Collection Time: 03/06/20  2:49 AM  Result Value Ref Range   WBC 10.0 4.0 - 10.5 K/uL   RBC 4.09 3.87 - 5.11 MIL/uL   Hemoglobin 11.9 (L) 12.0 - 15.0 g/dL   HCT 96.7 59.1 - 63.8 %   MCV 93.4 80.0 - 100.0 fL   MCH 29.1 26.0 - 34.0 pg   MCHC 31.2 30.0 - 36.0 g/dL   RDW 46.6 (H) 59.9 - 35.7 %   Platelets 214 150 - 400 K/uL   nRBC 0.0 0.0 - 0.2 %   Neutrophils Relative % 90 %   Neutro Abs 8.9 (H) 1.7 - 7.7 K/uL   Lymphocytes Relative 7 %   Lymphs Abs 0.7 0.7 - 4.0 K/uL   Monocytes Relative 3 %   Monocytes Absolute 0.3 0.1 - 1.0 K/uL   Eosinophils Relative 0 %   Eosinophils Absolute 0.0 0.0 - 0.5 K/uL   Basophils Relative 0 %   Basophils Absolute 0.0 0.0 - 0.1 K/uL   Immature Granulocytes 0 %   Abs Immature Granulocytes 0.04 0.00 - 0.07 K/uL    Comment: Performed at [facility], [address], [place], [place] [address]  Magnesium      Status: None   Collection Time: 03/06/20  2:49 AM  Result Value Ref Range   Magnesium 2.0 1.7 - 2.4 mg/dL    Comment: Performed at [facility], [address], [place], [place] [address]  Phosphorus     Status: None   Collection Time: 03/06/20  2:49 AM  Result Value Ref Range   Phosphorus 3.2 2.5 - 4.6 mg/dL    Comment: Performed at [facility], [address], [place], [place] [address]  Brain natriuretic peptide     Status: Abnormal   Collection Time: 03/06/20  2:49 AM  Result Value Ref Range   B Natriuretic Peptide 918.7 (H) 0.0 - 100.0 pg/mL    Comment: Performed at [facility], [address] [place], [place] [address]  TSH     Status: Abnormal   Collection Time: 03/06/20  2:49 AM  Result Value Ref Range   TSH 0.337 (L) 0.350 - 4.500 uIU/mL    Comment: Performed by a 3rd Generation assay with a functional sensitivity of <=0.01 uIU/mL. Performed at [facility], [address] [place], [place] [id number]     Current Facility-Administered Medications  Medication Dose Route Frequency Provider Last Rate Last Admin  . (feeding supplement) PROSource Plus liquid 30 mL  30 mL Oral BID BM Leatha GildingGherghe, Costin M, MD   30 mL at 03/06/20 1249  . acetaminophen (TYLENOL) tablet 650 mg  650 mg Oral Q4H PRN McDonald, Mia A, PA-C      . albuterol (VENTOLIN HFA) 108 (90 Base) MCG/ACT inhaler 2 puff  2 puff Inhalation Q4H PRN McDonald, Mia A, PA-C   2 puff at 03/06/20 0906  . alum & mag hydroxide-simeth (MAALOX/MYLANTA) 200-200-20 MG/5ML suspension 30 mL  30 mL Oral Q6H PRN McDonald, Mia A, PA-C      . enoxaparin (LOVENOX) injection 30 mg  30 mg Subcutaneous Q24H Pham, Minh Q, RPH-CPP   30 mg at 03/06/20 0902  . famotidine (PEPCID) tablet 20 mg  20 mg Oral Daily McDonald, Mia A, PA-C   20 mg at 03/06/20 0902  . feeding supplement (ENSURE ENLIVE / ENSURE PLUS) liquid 237 mL  237 mL Oral TID BM Leatha GildingGherghe, Costin M, MD   237 mL at 03/06/20 1249  . haloperidol lactate (HALDOL)  injection 2 mg  2 mg Intravenous Q6H PRN Opyd, Lavone Neriimothy S, MD      . metoprolol tartrate (LOPRESSOR) tablet 12.5 mg  12.5 mg Oral BID Leatha GildingGherghe, Costin M, MD   12.5 mg at 03/05/20 2113  . mometasone-formoterol (DULERA) 200-5 MCG/ACT inhaler 2 puff  2 puff Inhalation BID McDonald, Mia A, PA-C   2 puff at 03/06/20 0911  . multivitamin with minerals tablet 1 tablet  1 tablet Oral Daily Leatha GildingGherghe, Costin M, MD   1 tablet at 03/06/20 0901  . nicotine (NICODERM CQ - dosed in mg/24 hours) patch 21 mg  21 mg Transdermal Daily McDonald, Mia A, PA-C   21 mg at 03/06/20 0933  . ondansetron (ZOFRAN) tablet 4 mg  4 mg Oral Q6H PRN Hillary BowGardner, Jared M, DO       Or  . ondansetron [facility](ZOFRAN) injection 4 mg  4 mg Intravenous Q6H PRN Hillary BowGardner, Jared M, DO      . predniSONE (DELTASONE) tablet 40 mg  40 mg Oral Q breakfast Leatha GildingGherghe, Costin M, MD   40 mg at 03/06/20 0902  . QUEtiapine (SEROQUEL) tablet 25 mg  25 mg Oral QHS McDonald, Mia A, PA-C   25 mg at 03/05/20 2113    Musculoskeletal: Strength & Muscle Tone: not tested Gait & Station: not tested Patient leans: N/A  Psychiatric Specialty Exam: Physical Exam Psychiatric:        Attention and Perception: Attention normal.        Mood and Affect: Affect is angry.        Speech: Speech normal.        Behavior: Behavior is cooperative.        Thought Content: Thought content includes homicidal ideation.        Cognition and Memory: Cognition is impaired.        Judgment: Judgment is impulsive.     Review of Systems  Constitutional: Negative.   HENT: Negative.   Psychiatric/Behavioral: Positive for behavioral problems.    Blood pressure 110/79, pulse 82, temperature 98.4 F (36.9 C), temperature source Oral, resp. rate 18, height 5\' 5"  (1.651 m), weight 48 kg, SpO2 93 %.Body mass index is 17.61 kg/m.  General Appearance: Casual  Eye Contact:  Good  Speech:  Clear and Coherent  Volume:  Increased  Mood:  Irritable  Affect:  Congruent  Thought Process:   Disorganized  Orientation:  Other:  oriented only to person, place and not time  Thought Content:  Rumination  Suicidal Thoughts:  No  Homicidal Thoughts:  Yes.  without intent/plan  Memory:  Immediate;   Fair Recent;   Poor Remote;   Poor  Judgement:  Poor  Insight:  Shallow  Psychomotor Activity:  Increased  Concentration:  Concentration: Fair and Attention Span: Fair  Recall:  [employer] of Knowledge:  Fair  Language:  Fair  Akathisia:  No  Handed:  Right  AIMS (if indicated):     Assets:  Desire for Improvement  ADL's:  marginal  Cognition:  Impaired,  Mild  Sleep:        Treatment Plan Summary: 80 year old female with history of Dementia, COPD, CHF,HTN who was admitted due to aggressive behavior and homicidal threat towards her daughter. Patient still not able to contract for safety and will benefit from inpatient psychiatric admission for stabilization.  Recommendations: -Continue 1: sitter for safety -Continue current medications -Social worker to continue looking for  inpatient geriatric admission-patient still reports homicidal thoughts towards her Daughter and she refused nursing home or assisted living facility placement. -Consider Involuntary commitment if patient refused voluntary hospital admission.    Disposition: Recommend psychiatric Inpatient admission when medically cleared. Psychiatric service signing off. Re-consult as needed  Thedore Mins, MD 03/06/2020 6:41 PM

## 2020-03-07 DIAGNOSIS — I5032 Chronic diastolic (congestive) heart failure: Secondary | ICD-10-CM | POA: Diagnosis not present

## 2020-03-07 DIAGNOSIS — F0391 Unspecified dementia with behavioral disturbance: Secondary | ICD-10-CM | POA: Diagnosis not present

## 2020-03-07 DIAGNOSIS — J449 Chronic obstructive pulmonary disease, unspecified: Secondary | ICD-10-CM | POA: Diagnosis not present

## 2020-03-07 DIAGNOSIS — J9601 Acute respiratory failure with hypoxia: Secondary | ICD-10-CM | POA: Diagnosis not present

## 2020-03-07 LAB — CBC WITH DIFFERENTIAL/PLATELET
Abs Immature Granulocytes: 0.05 10*3/uL (ref 0.00–0.07)
Basophils Absolute: 0 10*3/uL (ref 0.0–0.1)
Basophils Relative: 0 %
Eosinophils Absolute: 0 10*3/uL (ref 0.0–0.5)
Eosinophils Relative: 0 %
HCT: 40.4 % (ref 36.0–46.0)
Hemoglobin: 12.5 g/dL (ref 12.0–15.0)
Immature Granulocytes: 1 %
Lymphocytes Relative: 6 %
Lymphs Abs: 0.6 10*3/uL — ABNORMAL LOW (ref 0.7–4.0)
MCH: 28.6 pg (ref 26.0–34.0)
MCHC: 30.9 g/dL (ref 30.0–36.0)
MCV: 92.4 fL (ref 80.0–100.0)
Monocytes Absolute: 0.5 10*3/uL (ref 0.1–1.0)
Monocytes Relative: 5 %
Neutro Abs: 9.1 10*3/uL — ABNORMAL HIGH (ref 1.7–7.7)
Neutrophils Relative %: 88 %
Platelets: 197 10*3/uL (ref 150–400)
RBC: 4.37 MIL/uL (ref 3.87–5.11)
RDW: 16.3 % — ABNORMAL HIGH (ref 11.5–15.5)
WBC: 10.4 10*3/uL (ref 4.0–10.5)
nRBC: 0 % (ref 0.0–0.2)

## 2020-03-07 LAB — COMPREHENSIVE METABOLIC PANEL
ALT: 52 U/L — ABNORMAL HIGH (ref 0–44)
AST: 58 U/L — ABNORMAL HIGH (ref 15–41)
Albumin: 2.4 g/dL — ABNORMAL LOW (ref 3.5–5.0)
Alkaline Phosphatase: 38 U/L (ref 38–126)
Anion gap: 9 (ref 5–15)
BUN: 66 mg/dL — ABNORMAL HIGH (ref 8–23)
CO2: 40 mmol/L — ABNORMAL HIGH (ref 22–32)
Calcium: 9.4 mg/dL (ref 8.9–10.3)
Chloride: 88 mmol/L — ABNORMAL LOW (ref 98–111)
Creatinine, Ser: 1.25 mg/dL — ABNORMAL HIGH (ref 0.44–1.00)
GFR, Estimated: 44 mL/min — ABNORMAL LOW (ref >60)
Glucose, Bld: 101 mg/dL — ABNORMAL HIGH (ref 70–99)
Potassium: 4.7 mmol/L (ref 3.5–5.1)
Sodium: 137 mmol/L (ref 135–145)
Total Bilirubin: 0.6 mg/dL (ref 0.3–1.2)
Total Protein: 5.1 g/dL — ABNORMAL LOW (ref 6.5–8.1)

## 2020-03-07 LAB — PHOSPHORUS: Phosphorus: 2.9 mg/dL (ref 2.5–4.6)

## 2020-03-07 LAB — MAGNESIUM: Magnesium: 2 mg/dL (ref 1.7–2.4)

## 2020-03-07 NOTE — Progress Notes (Signed)
PROGRESS NOTE    Patricia Terrell  CHY:[id number] DOB: Jan 18, 1941 DOA: 02/24/2020 PCP: Pcp, No   Brief Narrative:  The patient is a 80 year old African-American female with past medical history significant for but not limited to dementia, COPD/emphysema, hypertension as well as other comorbidities was admitted from 02/13/20-02/16/20 for COPD exacerbation and was found to be Covid positive at that time.  A CAT scan during admission was negative for PE but also was negative for multifocal pneumonia.  She received 3 days of remdesivir and was recommended to have 10 days of isolation which she is currently completed.  She was discharged home however she became more confused, belligerent and was Threatening to kill her daughter and was IVC on February 1 and brought back to the ED.  On 02/26/2020 she is noted to have respiratory distress requiring nonrebreather.  Chest x-ray showed patchy airspace opacity superimposed on chronic calcified pleural plaques.  BNP was elevated and thought to have a component of acute on chronic diastolic CHF and Lasix was given and she had been made to the hospital.  She was on room air and had improved significantly but is now back on O2 via N.  Her mentation is better but she continues to remain confused.  She was admitted for acute hypoxic respiratory failure possibly post Covid pneumonitis versus acute on chronic diastolic CHF.  She had been diuresed and had been deemed medically stable for discharge to [facility] psych facility but now remains intermittently on O2 and today desaturated without it. CXR today showed "Chronic lung disease and cardiomegaly. No acute cardiopulmonary abnormality identified." Will give a dose of Lasix 20 mg x1 and Cardiology was consulted for further evaluation.  Cardiology evaluated and she has HCM and now appears euvolemic and Cardiology recommending avoiding overdiuresis and recommending continue Beta Blocker.Cardiology evaluated her Rhythm and is in NSR and had  no evidence of Atrial Fib and recommending continuing Beta Blocker if possible. For her HCM they feel that she is euvolemic and recommend no further diuresis despite elevated BNP.  Assessment & Plan:   Principal Problem:   Dementia with behavioral disturbance (HCC) Active Problems:   COPD (chronic obstructive pulmonary disease) (HCC)   HTN (hypertension)   Chronic diastolic CHF (congestive heart failure) (HCC)   Acute respiratory failure with hypoxia (HCC)   Acute hypoxemic respiratory failure (HCC)  Acute hypoxic respiratory failure, possibly post Covid pneumonitis with likely concomitant acute on chronic Grade 1 Diastolic CHF -It is likely that her respiratory failure was caused by fluid overload mainly.  Daughter tells me that patient has been noncompliant with her medications at home, and when she was in the ED for couple of days in psych hold she received all her home medications, possibly causing hypotension which can precipitate heart failure given her significant hypertrophic cardiomyopathy.  She was given Lasix and was weaned to room air but back on Supplemental O2 and desaturates without it  -SpO2: 96 % O2 Flow Rate (L/min): 2 L/min -Post Covid Covid pneumonitis is also a possibility and she has been placed on steroids.  Will do limited course for max 10 days; she is on prednisone 40 mg p.o. daily that was started on 02/27/2020 -2D echo done showed severe concentric LVH consistent with hypertrophic cardiomyopathy but no significant obstruction at rest, LVEF 70-75% and hyperdynamic function.  It also showed grade 1 diastolic dysfunction.  -Will repeat a CXR and showed "Chronic lung disease and cardiomegaly. No acute cardiopulmonary abnormality identified." -Continue with Dulera 2 puffs  IH twice daily and with albuterol 2 puffs IH -Continue TED hoses and elevation of the lower extremities for chronic edema -Given Lasix a few days ago but will not give any more -Patient is +9.237 Liters  Positive and Weights not done -Daily weights  -Continue to monitor For S/Sx of Volume overload -Cardiology consulted and they feel she is Euvolemic and recommend no further diuresis -Recommending continuing Beta Blocker   Hypertrophic cardiomyopathy, severe concentric LVH, grade 1 diastolic dysfunction -Patient does not have a prior echo in the system. -Stop amlodipine, Imdur as this can worsen heart failure symptoms.  She seems to be tolerating beta-blockers but was bradycardic today and will need to watch carefully.  Added back on low-dose lisinopril and HCTZ given hypertension but will to he held given hypotension and slightly worsening renal fxn.  Blood pressure better -Last BP was 99/70 -Was given a dose IV Lasix yesterday  -Cardiology consulted for further evaluation and recommending no further diuresis despite elevated BNP at 918.7 -They recommend Beta Blocker as Tolerated  CKD stage IIIa -Chronic.  BUN/creatinine has gone from 52/1.11 -> 48/1.36 -> 47/1.11 -> 60/1.19 -> 66/1.25 -avoid further nephrotoxic medications (lisinopril and HCTZ had been resumed but will hold again), contrast dyes, hypotension renally dose medications -repeat CMP in a.m.  Tobacco Abuse  -Smoking cessation counseling -Continue with nicotine patch 21 mg transdermally every 24 hours  Essential Hypertension -Held Lisinopril.  Her amlodipine and Imdur were discontinued due to her hypertrophic cardiomyopathy.  Blood pressure better but yesterday she was bradycardic and may need to hold her beta-blocker as well and observe; Held BB this Am because of Hypotension -Continue to Monitor BP per Protocol -Last BP was 110/74  Dementia, behavioral disturbances -Consulted psych as well as appreciate reevaluation and follow-up.  She was IVC by family due to homicidal ideation as she was threatening the daughter to kill her in her sleep.  Renewed IVC last week.  She was evaluated by psychiatry who recommended Geri  psych; social work is attempting to locate a Geri psych facility bed but unable to procure placement currently. Will get Psych to re-evaluate  -Continue with Quetiapine 25 mg p.o. nightly -Remains confused but pleasant -Had to be given Haldol Lactate last night and is now on 2 mg IV q6hprn  -Repeat Psych Evaluation done and there continue to recommend inpatient psychiatric admission given her homicidal thoughts towards her daughter.  If she remains hospitalized we will continue her IVC  Abnormal LFT's, slightly elevated but stable -AST went from 31 -> 37 -> 55 -> 58 -ALT went from 35 -> 39 -> 57 -> 52 -Likely from above -Continue to Monitor and Trend and if worsening will obtain RUQ U/S and Acute Hepatitis Panel -Repeat CMP in the AM  Abnormal EKG/Arrythmias -Patient had an EKG which showed T wave inversions and it was reviewed with Dr. Eldridge Dace who felt that since the patient was asymptomatic no further cardiac work-up was needed. -Cardiology consulted and appreciate further assistance  -Cardiology recommends continuing BB and saw no Evidence of Atrial Fibrillation   Underweight -Continue with Prosource +30 mmol p.o. twice daily as well as Ensure alive 237 mL p.o. 3 times daily between meals and multivitamin with minerals 1 tab p.o. daily  GERD -Continue with Famotidine 20 mg p.o. daily  Goals of care: DNR POA  DVT prophylaxis: Heparin 30 mg subcu every 24 Code Status: DO NOT RESUSCITATE Family Communication: No family present at bedside Disposition Plan: Pending discharge to [facility]  facility but no bed placement however will get Repeat Psych Evaluation   Status is: Inpatient  Remains inpatient appropriate because:Unsafe d/c plan, IV treatments appropriate due to intensity of illness or inability to take PO and Inpatient level of care appropriate due to severity of illness   Dispo: The patient is from: Home              Anticipated d/c is to: [facility]                 Anticipated d/c date is: 1 day              Patient currently is medically stable to d/c.   Difficult to place patient No  Consultants:   Psychiatry   Procedures: None  Antimicrobials:  Anti-infectives (From admission, onward)   None        Subjective: Seen and examined at bedside and she felt sleepy this morning.  Continues to desaturate into the high 80s this morning off of oxygen.  No chest pain, lightheadedness.  No other concerns or complaints.  Frustrated that she cannot leave.  No other concerns or complaints at this time.  Objective: Vitals:   03/06/20 1500 03/06/20 1955 03/06/20 2033 03/07/20 0627  BP: 110/79  96/65 110/74  Pulse: 82  75 60  Resp: 18  18 20   Temp: 98.4 F (36.9 C)  97.9 F (36.6 C) 98 F (36.7 C)  TempSrc: Oral  Oral Axillary  SpO2: 93% 94% 90% 96%  Weight:      Height:        Intake/Output Summary (Last 24 hours) at 03/07/2020 1433 Last data filed at 03/07/2020 1321 Gross per 24 hour  Intake 1040 ml  Output 2 ml  Net 1038 ml   Filed Weights   02/26/20 1100  Weight: 48 kg   Examination: Physical Exam:  Constitutional: The patient is a thin AAF in NAD and appears calm and comfortable  Eyes: Lids and conjunctivae normal, sclerae anicteric  ENMT: External Ears, Nose appear normal. Grossly normal hearing.  Neck: Appears normal, supple, no cervical masses, normal ROM, no appreciable thyromegaly; no JVD Respiratory: Diminished to auscultation bilaterally with coarse breath sounds, no wheezing, rales, rhonchi or crackles. Normal respiratory effort and patient is not tachypenic. No accessory muscle use.  Her supplemental oxygen was not her nose this morning and her saturations were in the high 80s Cardiovascular: RRR, no murmurs / rubs / gallops. S1 and S2 auscultated. 1+ LE Edema Abdomen: Soft, non-tender, non-distended. Bowel sounds positive.  GU: Deferred. Musculoskeletal: No clubbing / cyanosis of digits/nails. No joint deformity upper  and lower extremities.  Skin: No rashes, lesions, ulcers on a limited skin evaluation. No induration; Warm and dry.  Neurologic: CN 2-12 grossly intact with no focal deficits. Romberg sign and cerebellar reflexes not assessed.  Psychiatric: Mildly impaired judgment and insight. Alert and oriented x 2. Normal mood and appropriate affect.   Data Reviewed: I have personally reviewed following labs and imaging studies  CBC: Recent Labs  Lab 03/03/20 0026 03/04/20 0121 03/05/20 0158 03/06/20 0249 03/07/20 0249  WBC 8.1 8.1 6.9 10.0 10.4  NEUTROABS  --  7.1 6.1 8.9* 9.1*  HGB 12.0 12.0 12.7 11.9* 12.5  HCT 38.9 37.3 39.6 38.2 40.4  MCV 93.1 91.6 92.5 93.4 92.4  PLT 232 237 213 214 197   Basic Metabolic Panel: Recent Labs  Lab 03/01/20 0423 03/02/20 1307 03/03/20 0026 03/04/20 0121 03/05/20 0158 03/06/20 0249 03/07/20 0249  NA 138   < > 136 138 139 137 137  K 5.2*   < > 4.0 4.2 4.5 4.6 4.7  CL 91*   < > 90* 90* 92* 91* 88*  CO2 36*   < > 34* 38* 39* 37* 40*  GLUCOSE 89   < > 124* 83 108* 112* 101*  BUN 40*   < > 52* 48* 47* 60* 66*  CREATININE 1.22*   < > 1.11* 1.36* 1.11* 1.19* 1.25*  CALCIUM 9.2   < > 9.3 9.4 9.0 9.0 9.4  MG 1.9  --   --  1.9 1.9 2.0 2.0  PHOS  --   --   --  3.0 3.4 3.2 2.9   < > = values in this interval not displayed.   GFR: Estimated Creatinine Clearance: 27.7 mL/min (A) (by C-G formula based on SCr of 1.25 mg/dL (H)). Liver Function Tests: Recent Labs  Lab 03/04/20 0121 03/05/20 0158 03/06/20 0249 03/07/20 0249  AST 31 37 55* 58*  ALT 35 39 57* 52*  ALKPHOS 34* 36* 36* 38  BILITOT 0.9 0.7 0.7 0.6  PROT 5.0* 5.3* 4.9* 5.1*  ALBUMIN 2.3* 2.4* 2.3* 2.4*   No results for input(s): LIPASE, AMYLASE in the last 168 hours. No results for input(s): AMMONIA in the last 168 hours. Coagulation Profile: No results for input(s): INR, PROTIME in the last 168 hours. Cardiac Enzymes: No results for input(s): CKTOTAL, CKMB, CKMBINDEX, TROPONINI in the  last 168 hours. BNP (last 3 results) No results for input(s): PROBNP in the last 8760 hours. HbA1C: No results for input(s): HGBA1C in the last 72 hours. CBG: No results for input(s): GLUCAP in the last 168 hours. Lipid Profile: No results for input(s): CHOL, HDL, LDLCALC, TRIG, CHOLHDL, LDLDIRECT in the last 72 hours. Thyroid Function Tests: Recent Labs    03/06/20 0249  TSH 0.337*   Anemia Panel: No results for input(s): VITAMINB12, FOLATE, FERRITIN, TIBC, IRON, RETICCTPCT in the last 72 hours. Sepsis Labs: No results for input(s): PROCALCITON, LATICACIDVEN in the last 168 hours.  No results found for this or any previous visit (from the past 240 hour(s)).   RN Pressure Injury Documentation:     Estimated body mass index is 17.61 kg/m as calculated from the following:   Height as of this encounter: 5\' 5"  (1.651 m).   Weight as of this encounter: 48 kg.  Malnutrition Type:  Nutrition Problem: Increased nutrient needs Etiology: chronic illness (COPD, CHF)  Malnutrition Characteristics:  Signs/Symptoms: estimated needs  Nutrition Interventions:  Interventions: Ensure Enlive (each supplement provides 350kcal and 20 grams of protein),MVI,Prostat   Radiology Studies: DG CHEST PORT 1 VIEW  Result Date: 03/06/2020 CLINICAL DATA:  Shortness of breath.  COVID-19 positive EXAM: PORTABLE CHEST 1 VIEW COMPARISON:  March 05, 2020 FINDINGS: Multiple calcified granulomas are noted throughout the lungs. There is atelectasis in the lung bases. There is an equivocal small right pleural effusion. Heart is enlarged with pulmonary vascularity normal. There is aortic atherosclerosis. No bone lesions. IMPRESSION: Bibasilar atelectasis with equivocal right pleural effusion. No consolidation. Multiple calcified granulomas throughout the lungs. Stable cardiomegaly. Aortic Atherosclerosis (ICD10-I70.0). Electronically Signed   By: March 07, 2020 III M.D.   On: 03/06/2020 09:27   Scheduled  Meds: . (feeding supplement) PROSource Plus  30 mL Oral BID BM  . enoxaparin (LOVENOX) injection  30 mg Subcutaneous Q24H  . famotidine  20 mg Oral Daily  . feeding supplement  237 mL Oral TID BM  .  metoprolol tartrate  12.5 mg Oral BID  . mometasone-formoterol  2 puff Inhalation BID  . multivitamin with minerals  1 tablet Oral Daily  . nicotine  21 mg Transdermal Daily  . predniSONE  40 mg Oral Q breakfast  . QUEtiapine  25 mg Oral QHS   Continuous Infusions:   LOS: 10 days   Merlene Laughter, DO [facility] PAGER is on AMION  If 7PM-7AM, please contact night-coverage [email or web address]

## 2020-03-08 ENCOUNTER — Inpatient Hospital Stay (HOSPITAL_COMMUNITY): Payer: Medicare HMO

## 2020-03-08 DIAGNOSIS — I5032 Chronic diastolic (congestive) heart failure: Secondary | ICD-10-CM | POA: Diagnosis not present

## 2020-03-08 DIAGNOSIS — J9601 Acute respiratory failure with hypoxia: Secondary | ICD-10-CM | POA: Diagnosis not present

## 2020-03-08 DIAGNOSIS — F0391 Unspecified dementia with behavioral disturbance: Secondary | ICD-10-CM | POA: Diagnosis not present

## 2020-03-08 DIAGNOSIS — J449 Chronic obstructive pulmonary disease, unspecified: Secondary | ICD-10-CM | POA: Diagnosis not present

## 2020-03-08 LAB — COMPREHENSIVE METABOLIC PANEL
ALT: 47 U/L — ABNORMAL HIGH (ref 0–44)
AST: 49 U/L — ABNORMAL HIGH (ref 15–41)
Albumin: 2.4 g/dL — ABNORMAL LOW (ref 3.5–5.0)
Alkaline Phosphatase: 39 U/L (ref 38–126)
Anion gap: 8 (ref 5–15)
BUN: 64 mg/dL — ABNORMAL HIGH (ref 8–23)
CO2: 41 mmol/L — ABNORMAL HIGH (ref 22–32)
Calcium: 9.1 mg/dL (ref 8.9–10.3)
Chloride: 89 mmol/L — ABNORMAL LOW (ref 98–111)
Creatinine, Ser: 1.66 mg/dL — ABNORMAL HIGH (ref 0.44–1.00)
GFR, Estimated: 31 mL/min — ABNORMAL LOW (ref >60)
Glucose, Bld: 89 mg/dL (ref 70–99)
Potassium: 4.7 mmol/L (ref 3.5–5.1)
Sodium: 138 mmol/L (ref 135–145)
Total Bilirubin: 0.9 mg/dL (ref 0.3–1.2)
Total Protein: 5 g/dL — ABNORMAL LOW (ref 6.5–8.1)

## 2020-03-08 LAB — CBC WITH DIFFERENTIAL/PLATELET
Abs Immature Granulocytes: 0.02 10*3/uL (ref 0.00–0.07)
Basophils Absolute: 0 10*3/uL (ref 0.0–0.1)
Basophils Relative: 0 %
Eosinophils Absolute: 0 10*3/uL (ref 0.0–0.5)
Eosinophils Relative: 0 %
HCT: 40.6 % (ref 36.0–46.0)
Hemoglobin: 12.4 g/dL (ref 12.0–15.0)
Immature Granulocytes: 0 %
Lymphocytes Relative: 11 %
Lymphs Abs: 1 10*3/uL (ref 0.7–4.0)
MCH: 28.4 pg (ref 26.0–34.0)
MCHC: 30.5 g/dL (ref 30.0–36.0)
MCV: 93.1 fL (ref 80.0–100.0)
Monocytes Absolute: 0.5 10*3/uL (ref 0.1–1.0)
Monocytes Relative: 6 %
Neutro Abs: 7.4 10*3/uL (ref 1.7–7.7)
Neutrophils Relative %: 83 %
Platelets: 229 10*3/uL (ref 150–400)
RBC: 4.36 MIL/uL (ref 3.87–5.11)
RDW: 16.6 % — ABNORMAL HIGH (ref 11.5–15.5)
WBC: 8.9 10*3/uL (ref 4.0–10.5)
nRBC: 0 % (ref 0.0–0.2)

## 2020-03-08 LAB — PHOSPHORUS: Phosphorus: 4.3 mg/dL (ref 2.5–4.6)

## 2020-03-08 LAB — MAGNESIUM: Magnesium: 2 mg/dL (ref 1.7–2.4)

## 2020-03-08 NOTE — Progress Notes (Addendum)
PROGRESS NOTE    Patricia Terrell  WJX:[id number] DOB: December 22, 1940 DOA: 02/24/2020 PCP: Pcp, No   Brief Narrative:  The patient is a 80 year old African-American female with past medical history significant for but not limited to dementia, COPD/emphysema, hypertension as well as other comorbidities was admitted from 02/13/20-02/16/20 for COPD exacerbation and was found to be Covid positive at that time.  A CAT scan during admission was negative for PE but also was negative for multifocal pneumonia.  She received 3 days of remdesivir and was recommended to have 10 days of isolation which she is currently completed.  She was discharged home however she became more confused, belligerent and was Threatening to kill her daughter and was IVC on February 1 and brought back to the ED.  On 02/26/2020 she is noted to have respiratory distress requiring nonrebreather.  Chest x-ray showed patchy airspace opacity superimposed on chronic calcified pleural plaques.  BNP was elevated and thought to have a component of acute on chronic diastolic CHF and Lasix was given and she had been made to the hospital.  She was on room air and had improved significantly but is now back on O2 via N.  Her mentation is better but she continues to remain confused.  She was admitted for acute hypoxic respiratory failure possibly post Covid pneumonitis versus acute on chronic diastolic CHF.  She had been diuresed and had been deemed medically stable for discharge to [facility] psych facility but now remains intermittently on O2 and today desaturated without it. CXR today showed "Chronic lung disease and cardiomegaly. No acute cardiopulmonary abnormality identified." Will give a dose of Lasix 20 mg x1 and Cardiology was consulted for further evaluation.  Cardiology evaluated and she has HCM and now appears euvolemic and Cardiology recommending avoiding overdiuresis and recommending continue Beta Blocker.Cardiology evaluated her Rhythm and is in NSR and had  no evidence of Atrial Fib and recommending continuing Beta Blocker if possible. For her HCM they feel that she is euvolemic and recommend no further diuresis despite elevated BNP. Renal function is still fluctuating and psychiatry continues recommend inpatient psychiatric Geri psych facility hospitalization  Assessment & Plan:   Principal Problem:   Dementia with behavioral disturbance (HCC) Active Problems:   COPD (chronic obstructive pulmonary disease) (HCC)   HTN (hypertension)   Chronic diastolic CHF (congestive heart failure) (HCC)   Acute respiratory failure with hypoxia (HCC)   Acute hypoxemic respiratory failure (HCC)  Acute hypoxic respiratory failure, possibly post Covid pneumonitis with likely concomitant acute on chronic Grade 1 Diastolic CHF -It is likely that her respiratory failure was caused by fluid overload mainly.  Daughter tells me that patient has been noncompliant with her medications at home, and when she was in the ED for couple of days in psych hold she received all her home medications, possibly causing hypotension which can precipitate heart failure given her significant hypertrophic cardiomyopathy.  She was given Lasix and was weaned to room air but back on Supplemental O2 and desaturates without it  -SpO2: 96 % O2 Flow Rate (L/min): 2 L/min -Post Covid Covid pneumonitis is also a possibility and she has been placed on steroids.  Will do limited course for max 10 days; she is on prednisone 40 mg p.o. daily that was started on 02/27/2020 -2D echo done showed severe concentric LVH consistent with hypertrophic cardiomyopathy but no significant obstruction at rest, LVEF 70-75% and hyperdynamic function.  It also showed grade 1 diastolic dysfunction.  -Repeat CXR 03/08/20 showed "Calcified granulomas  throughout lungs are stable. Mild right base atelectasis. Lungs elsewhere clear. Stable cardiac prominence. Aortic Atherosclerosis."  -Continue with Dulera 2 puffs IH twice daily  and with albuterol 2 puffs IH -Continue TED hoses and elevation of the lower extremities for chronic edema -Given Lasix a few days ago but will not give any more -Patient is + 9.7-4 liters Positive and Weights not done -Daily weights  -Continue to monitor For S/Sx of Volume overload -Cardiology consulted and they feel she is Euvolemic and recommend no further diuresis -Recommending continuing Beta Blocker   Hypertrophic cardiomyopathy, severe concentric LVH, grade 1 diastolic dysfunction -Patient does not have a prior echo in the system. -Stop amlodipine, Imdur as this can worsen heart failure symptoms.  She seems to be tolerating beta-blockers but was bradycardic today and will need to watch carefully.  Added back on low-dose lisinopril and HCTZ given hypertension but will to he held given hypotension and slightly worsening renal fxn.  Blood pressure better -Last BP was improved and is now 134/84 -Was given a dose IV Lasix during the last week -Cardiology consulted for further evaluation and recommending no further diuresis despite elevated BNP at 918.7 -They recommend Beta Blocker as Tolerated  CKD stage IIIa, slightly worsened -Chronic.  BUN/creatinine has gone from 52/1.11 -> 48/1.36 -> 47/1.11 -> 60/1.19 -> 66/1.25 and is now 64/1.66 -avoid further nephrotoxic medications (lisinopril and HCTZ had been resumed but will hold again), contrast dyes, hypotension renally dose medications -repeat CMP in a.m.  Tobacco Abuse  -Smoking cessation counseling -Continue with nicotine patch 21 mg transdermally every 24 hours  Essential Hypertension -Held Lisinopril.  Her amlodipine and Imdur were discontinued due to her hypertrophic cardiomyopathy.  Blood pressure better but yesterday she was bradycardic and may need to hold her beta-blocker as well and observe; Held BB this Am because of Hypotension -Continue to Monitor BP per Protocol -Last BP was 110/74  Dementia, behavioral  disturbances -Consulted psych as well as appreciate reevaluation and follow-up.  She was IVC by family due to homicidal ideation as she was threatening the daughter to kill her in her sleep.  Renewed IVC last week.  She was evaluated by psychiatry who recommended Geri psych; social work is attempting to locate a Geri psych facility bed but unable to procure placement currently. Will get Psych to re-evaluate  -Continue with Quetiapine 25 mg p.o. nightly -Remains very mildly confused but pleasant -Had to be given Haldol Lactate last night and is now on 2 mg IV q6hprn  -Repeat Psych Evaluation done and there continue to recommend inpatient psychiatric admission given her homicidal thoughts towards her daughter.  If she remains hospitalized this coming week we will continue her IVC and her safety sitter  Abnormal LFT's, slightly elevated but stable -AST went from 31 -> 37 -> 55 -> 58 and is 49 today  -ALT went from 35 -> 39 -> 57 -> 52 and is 47 today -Likely from above -Continue to Monitor and Trend and if worsening will obtain RUQ U/S and Acute Hepatitis Panel -Repeat CMP in the AM  Abnormal EKG/Arrythmias -Patient had an EKG which showed T wave inversions and it was reviewed with Dr. Eldridge DaceVaranasi who felt that since the patient was asymptomatic no further cardiac work-up was needed. -Cardiology consulted and appreciate further assistance  -Cardiology recommends continuing BB and saw no Evidence of Atrial Fibrillation   Underweight -Continue with Prosource +30 mmol p.o. twice daily as well as Ensure alive 237 mL p.o. 3 times daily  between meals and multivitamin with minerals 1 tab p.o. daily  GERD -Continue with Famotidine 20 mg p.o. daily  Goals of care: DNR POA  DVT prophylaxis: Heparin 30 mg subcu every 24 Code Status: DO NOT RESUSCITATE Family Communication: No family present at bedside Disposition Plan: Pending discharge to [facility] psych facility but no bed placement   Status is:  Inpatient  Remains inpatient appropriate because:Unsafe d/c plan, IV treatments appropriate due to intensity of illness or inability to take PO and Inpatient level of care appropriate due to severity of illness   Dispo: The patient is from: Home              Anticipated d/c is to: [facility]                Anticipated d/c date is: 1 day              Patient currently is medically stable to d/c.   Difficult to place patient No  Consultants:   Psychiatry   Procedures: None  Antimicrobials:  Anti-infectives (From admission, onward)   None        Subjective: Seen and examined at bedside and she is sitting in the chair bedside wearing supplemental oxygen.  Wanting to know when she can leave.  Denies any chest pain, shortness of breath.  States that she still has some swelling.  No other concerns or complaints at this time.  .  Objective: Vitals:   03/07/20 1537 03/07/20 2000 03/07/20 2127 03/08/20 0404  BP: 107/73 110/63 119/63 134/84  Pulse: 78 74  62  Resp: 19   20  Temp: (!) 97.2 F (36.2 C) 98 F (36.7 C)  98.1 F (36.7 C)  TempSrc: Oral Oral  Oral  SpO2:  93%  96%  Weight:      Height:        Intake/Output Summary (Last 24 hours) at 03/08/2020 1250 Last data filed at 03/08/2020 0830 Gross per 24 hour  Intake 607 ml  Output -  Net 607 ml   Filed Weights   02/26/20 1100  Weight: 48 kg   Examination: Physical Exam:  Constitutional: The patient is a thin African-American female currently in no acute distress appears calm  Eyes: Lids and conjunctivae normal, sclerae anicteric  ENMT: External Ears, Nose appear normal. Grossly normal hearing.  Neck: Appears normal, supple, no cervical masses, normal ROM, no appreciable thyromegaly; no JVD Respiratory: Diminished to auscultation bilaterally with coarse breath sounds and some slight crackles but no appreciable, no wheezing, rales, rhonchi. Normal respiratory effort and patient is not tachypenic. No accessory  muscle use.  Unlabored breathing wearing supplemental oxygen via nasal cannula Cardiovascular: RRR, no murmurs / rubs / gallops. S1 and S2 auscultated.  Has 1+ lower extremity edema Abdomen: Soft, non-tender, non-distended.  Bowel sounds positive.  GU: Deferred. Musculoskeletal: No clubbing / cyanosis of digits/nails. No joint deformity upper and lower extremities.  Skin: No rashes, lesions, ulcers on limited skin evaluation. No induration; Warm and dry.  Neurologic: CN 2-12 grossly intact with no focal deficits.  Romberg sign and cerebellar reflexes not assessed.  Psychiatric: Mildly impaired judgment and insight. Alert and oriented x 2. Normal mood and appropriate affect.   Data Reviewed: I have personally reviewed following labs and imaging studies  CBC: Recent Labs  Lab 03/04/20 0121 03/05/20 0158 03/06/20 0249 03/07/20 0249 03/08/20 0356  WBC 8.1 6.9 10.0 10.4 8.9  NEUTROABS 7.1 6.1 8.9* 9.1* 7.4  HGB 12.0 12.7 11.9*  12.5 12.4  HCT 37.3 39.6 38.2 40.4 40.6  MCV 91.6 92.5 93.4 92.4 93.1  PLT 237 213 214 197 229   Basic Metabolic Panel: Recent Labs  Lab 03/04/20 0121 03/05/20 0158 03/06/20 0249 03/07/20 0249 03/08/20 0356  NA 138 139 137 137 138  K 4.2 4.5 4.6 4.7 4.7  CL 90* 92* 91* 88* 89*  CO2 38* 39* 37* 40* 41*  GLUCOSE 83 108* 112* 101* 89  BUN 48* 47* 60* 66* 64*  CREATININE 1.36* 1.11* 1.19* 1.25* 1.66*  CALCIUM 9.4 9.0 9.0 9.4 9.1  MG 1.9 1.9 2.0 2.0 2.0  PHOS 3.0 3.4 3.2 2.9 4.3   GFR: Estimated Creatinine Clearance: 20.8 mL/min (A) (by C-G formula based on SCr of 1.66 mg/dL (H)). Liver Function Tests: Recent Labs  Lab 03/04/20 0121 03/05/20 0158 03/06/20 0249 03/07/20 0249 03/08/20 0356  AST 31 37 55* 58* 49*  ALT 35 39 57* 52* 47*  ALKPHOS 34* 36* 36* 38 39  BILITOT 0.9 0.7 0.7 0.6 0.9  PROT 5.0* 5.3* 4.9* 5.1* 5.0*  ALBUMIN 2.3* 2.4* 2.3* 2.4* 2.4*   No results for input(s): LIPASE, AMYLASE in the last 168 hours. No results for input(s):  AMMONIA in the last 168 hours. Coagulation Profile: No results for input(s): INR, PROTIME in the last 168 hours. Cardiac Enzymes: No results for input(s): CKTOTAL, CKMB, CKMBINDEX, TROPONINI in the last 168 hours. BNP (last 3 results) No results for input(s): PROBNP in the last 8760 hours. HbA1C: No results for input(s): HGBA1C in the last 72 hours. CBG: No results for input(s): GLUCAP in the last 168 hours. Lipid Profile: No results for input(s): CHOL, HDL, LDLCALC, TRIG, CHOLHDL, LDLDIRECT in the last 72 hours. Thyroid Function Tests: Recent Labs    03/06/20 0249  TSH 0.337*   Anemia Panel: No results for input(s): VITAMINB12, FOLATE, FERRITIN, TIBC, IRON, RETICCTPCT in the last 72 hours. Sepsis Labs: No results for input(s): PROCALCITON, LATICACIDVEN in the last 168 hours.  No results found for this or any previous visit (from the past 240 hour(s)).   RN Pressure Injury Documentation:     Estimated body mass index is 17.61 kg/m as calculated from the following:   Height as of this encounter: 5\' 5"  (1.651 m).   Weight as of this encounter: 48 kg.  Malnutrition Type:  Nutrition Problem: Increased nutrient needs Etiology: chronic illness (COPD, CHF)  Malnutrition Characteristics:  Signs/Symptoms: estimated needs  Nutrition Interventions:  Interventions: Ensure Enlive (each supplement provides 350kcal and 20 grams of protein),MVI,Prostat   Radiology Studies: DG CHEST PORT 1 VIEW  Result Date: 03/08/2020 CLINICAL DATA:  Shortness of breath.  COVID-19 positive EXAM: PORTABLE CHEST 1 VIEW COMPARISON:  March 06, 2020 FINDINGS: Multiple calcified granulomas are noted throughout the lungs. There is slight right base atelectasis. Lungs elsewhere are clear. Stable cardiac prominence is noted. The pulmonary vascularity is within normal limits. No adenopathy. There is aortic atherosclerosis. No bone lesions. IMPRESSION: Calcified granulomas throughout lungs are stable. Mild  right base atelectasis. Lungs elsewhere clear. Stable cardiac prominence. Aortic Atherosclerosis (ICD10-I70.0). Electronically Signed   By: March 08, 2020 III M.D.   On: 03/08/2020 08:47   Scheduled Meds: . (feeding supplement) PROSource Plus  30 mL Oral BID BM  . enoxaparin (LOVENOX) injection  30 mg Subcutaneous Q24H  . famotidine  20 mg Oral Daily  . feeding supplement  237 mL Oral TID BM  . metoprolol tartrate  12.5 mg Oral BID  . mometasone-formoterol  2 puff  Inhalation BID  . multivitamin with minerals  1 tablet Oral Daily  . nicotine  21 mg Transdermal Daily  . predniSONE  40 mg Oral Q breakfast  . QUEtiapine  25 mg Oral QHS   Continuous Infusions:   LOS: 11 days   Merlene Laughter, DO [facility] PAGER is on AMION  If 7PM-7AM, please contact night-coverage [email or web address]

## 2020-03-08 NOTE — TOC Progression Note (Signed)
Transition of Care [facility]) - Progression Note    Patient Details  Name: Shareeka Yim MRN: [id number] Date of Birth: [birth year]  [facility] CM/SW Contact  Mearl Latin, LCSW Phone Number: 03/08/2020, 9:36 AM  Clinical Narrative:    CSW faxed updated psych note to [facility]. Will complete [facility] referral and add patient to waiting list.    Expected Discharge Plan: Psychiatric Hospital Barriers to Discharge: Psych Bed not available  Expected Discharge Plan and Services Expected Discharge Plan: Psychiatric Hospital In-house Referral: Clinical Social Work   Post Acute Care Choice: Home Health Living arrangements for the past 2 months: Apartment                                       Social Determinants of Health (SDOH) Interventions    Readmission Risk Interventions No flowsheet data found.

## 2020-03-09 DIAGNOSIS — J9601 Acute respiratory failure with hypoxia: Secondary | ICD-10-CM | POA: Diagnosis not present

## 2020-03-09 DIAGNOSIS — J449 Chronic obstructive pulmonary disease, unspecified: Secondary | ICD-10-CM | POA: Diagnosis not present

## 2020-03-09 DIAGNOSIS — I5032 Chronic diastolic (congestive) heart failure: Secondary | ICD-10-CM | POA: Diagnosis not present

## 2020-03-09 DIAGNOSIS — F0391 Unspecified dementia with behavioral disturbance: Secondary | ICD-10-CM | POA: Diagnosis not present

## 2020-03-09 LAB — COMPREHENSIVE METABOLIC PANEL
ALT: 43 U/L (ref 0–44)
AST: 39 U/L (ref 15–41)
Albumin: 2.3 g/dL — ABNORMAL LOW (ref 3.5–5.0)
Alkaline Phosphatase: 32 U/L — ABNORMAL LOW (ref 38–126)
Anion gap: 7 (ref 5–15)
BUN: 55 mg/dL — ABNORMAL HIGH (ref 8–23)
CO2: 37 mmol/L — ABNORMAL HIGH (ref 22–32)
Calcium: 8.7 mg/dL — ABNORMAL LOW (ref 8.9–10.3)
Chloride: 95 mmol/L — ABNORMAL LOW (ref 98–111)
Creatinine, Ser: 1.32 mg/dL — ABNORMAL HIGH (ref 0.44–1.00)
GFR, Estimated: 41 mL/min — ABNORMAL LOW (ref >60)
Glucose, Bld: 89 mg/dL (ref 70–99)
Potassium: 4.7 mmol/L (ref 3.5–5.1)
Sodium: 139 mmol/L (ref 135–145)
Total Bilirubin: 0.7 mg/dL (ref 0.3–1.2)
Total Protein: 4.7 g/dL — ABNORMAL LOW (ref 6.5–8.1)

## 2020-03-09 LAB — RETICULOCYTES
Immature Retic Fract: 7.2 % (ref 2.3–15.9)
RBC.: 4.09 MIL/uL (ref 3.87–5.11)
Retic Count, Absolute: 49.9 10*3/uL (ref 19.0–186.0)
Retic Ct Pct: 1.2 % (ref 0.4–3.1)

## 2020-03-09 LAB — CBC WITH DIFFERENTIAL/PLATELET
Abs Immature Granulocytes: 0.03 10*3/uL (ref 0.00–0.07)
Basophils Absolute: 0 10*3/uL (ref 0.0–0.1)
Basophils Relative: 0 %
Eosinophils Absolute: 0 10*3/uL (ref 0.0–0.5)
Eosinophils Relative: 0 %
HCT: 36.9 % (ref 36.0–46.0)
Hemoglobin: 11.9 g/dL — ABNORMAL LOW (ref 12.0–15.0)
Immature Granulocytes: 0 %
Lymphocytes Relative: 10 %
Lymphs Abs: 0.7 10*3/uL (ref 0.7–4.0)
MCH: 29.7 pg (ref 26.0–34.0)
MCHC: 32.2 g/dL (ref 30.0–36.0)
MCV: 92 fL (ref 80.0–100.0)
Monocytes Absolute: 0.4 10*3/uL (ref 0.1–1.0)
Monocytes Relative: 6 %
Neutro Abs: 6.1 10*3/uL (ref 1.7–7.7)
Neutrophils Relative %: 84 %
Platelets: 201 10*3/uL (ref 150–400)
RBC: 4.01 MIL/uL (ref 3.87–5.11)
RDW: 16.8 % — ABNORMAL HIGH (ref 11.5–15.5)
WBC: 7.3 10*3/uL (ref 4.0–10.5)
nRBC: 0 % (ref 0.0–0.2)

## 2020-03-09 LAB — FERRITIN: Ferritin: 66 ng/mL (ref 11–307)

## 2020-03-09 LAB — MAGNESIUM: Magnesium: 2.1 mg/dL (ref 1.7–2.4)

## 2020-03-09 LAB — PHOSPHORUS: Phosphorus: 4.3 mg/dL (ref 2.5–4.6)

## 2020-03-09 LAB — VITAMIN B12: Vitamin B-12: 1203 pg/mL — ABNORMAL HIGH (ref 180–914)

## 2020-03-09 LAB — FOLATE: Folate: 8.6 ng/mL (ref >5.9)

## 2020-03-09 MED ORDER — POLYETHYLENE GLYCOL 3350 17 G PO PACK
17.0000 g | PACK | Freq: Two times a day (BID) | ORAL | Status: DC
  Administered 2020-03-09 – 2020-03-25 (×29): 17 g via ORAL
  Filled 2020-03-09 (×29): qty 1

## 2020-03-09 MED ORDER — SENNOSIDES-DOCUSATE SODIUM 8.6-50 MG PO TABS
1.0000 | ORAL_TABLET | Freq: Two times a day (BID) | ORAL | Status: DC
  Administered 2020-03-09 – 2020-03-26 (×31): 1 via ORAL
  Filled 2020-03-09 (×31): qty 1

## 2020-03-09 NOTE — TOC Progression Note (Signed)
Transition of Care [facility]) - Progression Note    Patient Details  Name: Patricia Terrell MRN: [id number] Date of Birth: [birth year]  Transition of Care [facility]) CM/SW Contact  Mearl Latin, LCSW Phone Number: 03/09/2020, 2:19 PM  Clinical Narrative:    CSW spoke with [facility] and completed Demographics screening for their waitlist.   CSW renewed IVC and faxed to magistrate.    Expected Discharge Plan: Psychiatric Hospital Barriers to Discharge: Psych Bed not available  Expected Discharge Plan and Services Expected Discharge Plan: Psychiatric Hospital In-house Referral: Clinical Social Work   Post Acute Care Choice: Home Health Living arrangements for the past 2 months: Apartment                                       Social Determinants of Health (SDOH) Interventions    Readmission Risk Interventions No flowsheet data found.

## 2020-03-09 NOTE — Progress Notes (Signed)
PROGRESS NOTE    Patricia Terrell  XLK:[id number] DOB: 1940-05-03 DOA: 02/24/2020 PCP: Pcp, No   Brief Narrative:  The patient is a 80 year old African-American female with past medical history significant for but not limited to dementia, COPD/emphysema, hypertension as well as other comorbidities was admitted from 02/13/20-02/16/20 for COPD exacerbation and was found to be Covid positive at that time.  A CAT scan during admission was negative for PE but also was negative for multifocal pneumonia.  She received 3 days of remdesivir and was recommended to have 10 days of isolation which she is currently completed.  She was discharged home however she became more confused, belligerent and was Threatening to kill her daughter and was IVC on February 1 and brought back to the ED.  On 02/26/2020 she is noted to have respiratory distress requiring nonrebreather.  Chest x-ray showed patchy airspace opacity superimposed on chronic calcified pleural plaques.  BNP was elevated and thought to have a component of acute on chronic diastolic CHF and Lasix was given and she had been made to the hospital.  She was on room air and had improved significantly but is now back on O2 via N.  Her mentation is better but she continues to remain confused.  She was admitted for acute hypoxic respiratory failure possibly post Covid pneumonitis versus acute on chronic diastolic CHF.  She had been diuresed and had been deemed medically stable for discharge to [facility] psych facility but now remains intermittently on O2 and today desaturated without it. CXR today showed "Chronic lung disease and cardiomegaly. No acute cardiopulmonary abnormality identified." Will give a dose of Lasix 20 mg x1 and Cardiology was consulted for further evaluation.  Cardiology evaluated and she has HCM and now appears euvolemic and Cardiology recommending avoiding overdiuresis and recommending continue Beta Blocker.Cardiology evaluated her Rhythm and is in NSR and had  no evidence of Atrial Fib and recommending continuing Beta Blocker if possible. For her HCM they feel that she is euvolemic and recommend no further diuresis despite elevated BNP. Renal function is still fluctuating and psychiatry continues recommend inpatient psychiatric Geri psych facility hospitalization  Assessment & Plan:   Principal Problem:   Dementia with behavioral disturbance (HCC) Active Problems:   COPD (chronic obstructive pulmonary disease) (HCC)   HTN (hypertension)   Chronic diastolic CHF (congestive heart failure) (HCC)   Acute respiratory failure with hypoxia (HCC)   Acute hypoxemic respiratory failure (HCC)  Acute hypoxic respiratory failure, possibly post Covid pneumonitis with likely concomitant acute on chronic Grade 1 Diastolic CHF -It is likely that her respiratory failure was caused by fluid overload mainly.  Daughter tells me that patient has been noncompliant with her medications at home, and when she was in the ED for couple of days in psych hold she received all her home medications, possibly causing hypotension which can precipitate heart failure given her significant hypertrophic cardiomyopathy.  She was given Lasix and was weaned to room air but back on Supplemental O2 and desaturates without it  -SpO2: 91 % O2 Flow Rate (L/min): 2 L/min -Post Covid Covid pneumonitis is also a possibility and she has been placed on steroids.  Will do limited course for max 10 days; she is on prednisone 40 mg p.o. daily that was started on 02/27/2020 -2D echo done showed severe concentric LVH consistent with hypertrophic cardiomyopathy but no significant obstruction at rest, LVEF 70-75% and hyperdynamic function.  It also showed grade 1 diastolic dysfunction.  -Repeat CXR 03/08/20 showed "Calcified granulomas  throughout lungs are stable. Mild right base atelectasis. Lungs elsewhere clear. Stable cardiac prominence. Aortic Atherosclerosis."  -Continue with Dulera 2 puffs IH twice daily  and with albuterol 2 puffs IH -Continue TED hoses and elevation of the lower extremities for chronic edema -Given Lasix a few days ago but will not give any more -Patient is + 10.401 liters Positive and Weights not done -Daily weights  -Continue to monitor For S/Sx of Volume overload -Cardiology consulted and they feel she is Euvolemic and recommend no further diuresis -Recommending continuing Beta Blocker if tolerated -Continue to Monitor; Stable to D/C to [employer]   Hypertrophic cardiomyopathy, severe concentric LVH, grade 1 diastolic dysfunction -Patient does not have a prior echo in the system. -Stop amlodipine, Imdur as this can worsen heart failure symptoms.  She seems to be tolerating beta-blockers but was bradycardic today and will need to watch carefully.  Added back on low-dose lisinopril and HCTZ given hypertension but will to he held given hypotension and slightly worsening renal fxn.  Blood pressure better -Last BP was improved and is now 128/77 -Was given a dose IV Lasix during the last week -Cardiology consulted for further evaluation and recommending no further diuresis despite elevated BNP at 918.7 -They recommend Beta Blocker as Tolerated  CKD stage IIIa, slightly worsened -Chronic.  BUN/creatinine has gone from 64/1.66 yesterday to 55/1.32 and has been fluctuating  -avoid further nephrotoxic medications (lisinopril and HCTZ had been resumed but will hold again), contrast dyes, hypotension renally dose medications -repeat CMP in a.m.  Tobacco Abuse  -Smoking cessation counseling -Continue with nicotine patch 21 mg transdermally every 24 hours  Essential Hypertension -Held Lisinopril.  Her amlodipine and Imdur were discontinued due to her hypertrophic cardiomyopathy.  Blood pressure better but yesterday she was bradycardic and may need to hold her beta-blocker as well and observe; Held BB this Am because of Hypotension -Continue to Monitor BP per  Protocol -Last BP was 128/77  Dementia, behavioral disturbances -Consulted psych as well as appreciate reevaluation and follow-up.  She was IVC by family due to homicidal ideation as she was threatening the daughter to kill her in her sleep.  Renewed IVC last week.  She was evaluated by psychiatry who recommended Geri psych; social work is attempting to locate a Geri psych facility bed but unable to procure placement currently. Will get Psych to re-evaluate  -Continue with Quetiapine 25 mg p.o. nightly -Remains very mildly confused but pleasant -Had to be given Haldol Lactate last night and is now on 2 mg IV q6hprn  -Repeat Psych Evaluation done and there continue to recommend inpatient psychiatric admission given her homicidal thoughts towards her daughter.  If she remains hospitalized this coming week we will continue her IVC and her safety sitter  Abnormal LFT's, resolved -Likely from above -AST is now 39 and ALT is now 43 -Continue to Monitor and Trend and if worsening will obtain RUQ U/S and Acute Hepatitis Panel -Repeat CMP in the AM  Normocytic Anemia -Patient's Hgb/Hct went from 12.4/40.6 -> 11.9/36.9 -Check Anemia Panel in the AM -Continue to Monitor for S/Sx of Bleeding -Repeat CBC in the AM   Abnormal EKG/Arrythmias -Patient had an EKG which showed T wave inversions and it was reviewed with Dr. Eldridge Dace who felt that since the patient was asymptomatic no further cardiac work-up was needed. -Cardiology consulted and appreciate further assistance  -Cardiology recommends continuing BB and saw no Evidence of Atrial Fibrillation   Underweight -Continue with Prosource +30 mmol p.o.  twice daily as well as Ensure alive 237 mL p.o. 3 times daily between meals and multivitamin with minerals 1 tab p.o. daily  GERD -Continue with Famotidine 20 mg p.o. daily  Constipation -Has not had a bowel movement in few days -We'll adjust bowel regimen and change from MiraLAX 17 g p.o. daily  as needed to scheduled twice daily -Also will change senna docusate one tab p.o. nightly as needed twice daily  Goals of care: DNR POA  DVT prophylaxis: Heparin 30 mg subcu every 24 Code Status: DO NOT RESUSCITATE Family Communication: No family present at bedside Disposition Plan: Pending discharge to [facility] psych facility but no bed placement   Status is: Inpatient  Remains inpatient appropriate because:Unsafe d/c plan, IV treatments appropriate due to intensity of illness or inability to take PO and Inpatient level of care appropriate due to severity of illness   Dispo: The patient is from: Home              Anticipated d/c is to: [facility]                Anticipated d/c date is: 1 day              Patient currently is medically stable to d/c.   Difficult to place patient No  Consultants:   Psychiatry   Procedures: None  Antimicrobials:  Anti-infectives (From admission, onward)   None        Subjective: Seen and examined at bedside and today she is complaining about a little bit mild right-sided abdominal pain states that she has not had a bowel movement in few days.  No nausea or vomiting.  No other concerns or complaints at this time and asking about when she can discharge again.  Objective: Vitals:   03/08/20 2029 03/08/20 2139 03/09/20 0530 03/09/20 1417  BP: 136/77 100/68 (!) 173/98 128/77  Pulse: 72 67 (!) 57 62  Resp: 18  20 19   Temp: 98 F (36.7 C)  98.1 F (36.7 C) 98.5 F (36.9 C)  TempSrc: Oral  Oral Oral  SpO2: 96%  92% 91%  Weight:      Height:        Intake/Output Summary (Last 24 hours) at 03/09/2020 1515 Last data filed at 03/08/2020 2000 Gross per 24 hour  Intake 457 ml  Output --  Net 457 ml   Filed Weights   02/26/20 1100  Weight: 48 kg   Examination: Physical Exam:  Constitutional: The patient is a thin African-American female currently in no acute distress appears calm Eyes: Lids and conjunctivae normal, sclerae  anicteric  ENMT: External Ears, Nose appear normal. Grossly normal hearing. Neck: Appears normal, supple, no cervical masses, normal ROM, no appreciable thyromegaly; no JVD Respiratory: Diminished to auscultation bilaterally with coarse breath sounds, no wheezing, rales, rhonchi or crackles. Normal respiratory effort and patient is not tachypenic. No accessory muscle use.  Unlabored breathing but is wearing supplemental oxygen via nasal cannula Cardiovascular: RRR, no murmurs / rubs / gallops. S1 and S2 auscultated.  Has 1+ lower extremity edema  Abdomen: Soft, non-tender, non-distended. Bowel sounds positive.  GU: Deferred. Musculoskeletal: No clubbing / cyanosis of digits/nails. No joint deformity upper and lower extremities.  Skin: No rashes, lesions, ulcers on limited skin evaluation. No induration; Warm and dry.  Neurologic: CN 2-12 grossly intact with no focal deficits. Romberg sign and cerebellar reflexes not assessed.  Psychiatric: Mildly impaired judgment and insight. Alert and oriented x 2. Normal mood  and appropriate affect.   Data Reviewed: I have personally reviewed following labs and imaging studies  CBC: Recent Labs  Lab 03/05/20 0158 03/06/20 0249 03/07/20 0249 03/08/20 0356 03/09/20 0415  WBC 6.9 10.0 10.4 8.9 7.3  NEUTROABS 6.1 8.9* 9.1* 7.4 6.1  HGB 12.7 11.9* 12.5 12.4 11.9*  HCT 39.6 38.2 40.4 40.6 36.9  MCV 92.5 93.4 92.4 93.1 92.0  PLT 213 214 197 229 201   Basic Metabolic Panel: Recent Labs  Lab 03/05/20 0158 03/06/20 0249 03/07/20 0249 03/08/20 0356 03/09/20 0415  NA 139 137 137 138 139  K 4.5 4.6 4.7 4.7 4.7  CL 92* 91* 88* 89* 95*  CO2 39* 37* 40* 41* 37*  GLUCOSE 108* 112* 101* 89 89  BUN 47* 60* 66* 64* 55*  CREATININE 1.11* 1.19* 1.25* 1.66* 1.32*  CALCIUM 9.0 9.0 9.4 9.1 8.7*  MG 1.9 2.0 2.0 2.0 2.1  PHOS 3.4 3.2 2.9 4.3 4.3   GFR: Estimated Creatinine Clearance: 26.2 mL/min (A) (by C-G formula based on SCr of 1.32 mg/dL (H)). Liver  Function Tests: Recent Labs  Lab 03/05/20 0158 03/06/20 0249 03/07/20 0249 03/08/20 0356 03/09/20 0415  AST 37 55* 58* 49* 39  ALT 39 57* 52* 47* 43  ALKPHOS 36* 36* 38 39 32*  BILITOT 0.7 0.7 0.6 0.9 0.7  PROT 5.3* 4.9* 5.1* 5.0* 4.7*  ALBUMIN 2.4* 2.3* 2.4* 2.4* 2.3*   No results for input(s): LIPASE, AMYLASE in the last 168 hours. No results for input(s): AMMONIA in the last 168 hours. Coagulation Profile: No results for input(s): INR, PROTIME in the last 168 hours. Cardiac Enzymes: No results for input(s): CKTOTAL, CKMB, CKMBINDEX, TROPONINI in the last 168 hours. BNP (last 3 results) No results for input(s): PROBNP in the last 8760 hours. HbA1C: No results for input(s): HGBA1C in the last 72 hours. CBG: No results for input(s): GLUCAP in the last 168 hours. Lipid Profile: No results for input(s): CHOL, HDL, LDLCALC, TRIG, CHOLHDL, LDLDIRECT in the last 72 hours. Thyroid Function Tests: No results for input(s): TSH, T4TOTAL, FREET4, T3FREE, THYROIDAB in the last 72 hours. Anemia Panel: No results for input(s): VITAMINB12, FOLATE, FERRITIN, TIBC, IRON, RETICCTPCT in the last 72 hours. Sepsis Labs: No results for input(s): PROCALCITON, LATICACIDVEN in the last 168 hours.  No results found for this or any previous visit (from the past 240 hour(s)).   RN Pressure Injury Documentation:     Estimated body mass index is 17.61 kg/m as calculated from the following:   Height as of this encounter:  (1.651 m).   Weight as of this encounter: 48 kg.  Malnutrition Type:  Nutrition Problem: Increased nutrient needs Etiology: chronic illness (COPD, CHF)  Malnutrition Characteristics:  Signs/Symptoms: estimated needs  Nutrition Interventions:  Interventions: Ensure Enlive (each supplement provides 350kcal and 20 grams of protein),MVI,Prostat   Radiology Studies: DG CHEST PORT 1 VIEW  Result Date: 03/08/2020 CLINICAL DATA:  Shortness of breath.  COVID-19 positive  EXAM: PORTABLE CHEST 1 VIEW COMPARISON:  March 06, 2020 FINDINGS: Multiple calcified granulomas are noted throughout the lungs. There is slight right base atelectasis. Lungs elsewhere are clear. Stable cardiac prominence is noted. The pulmonary vascularity is within normal limits. No adenopathy. There is aortic atherosclerosis. No bone lesions. IMPRESSION: Calcified granulomas throughout lungs are stable. Mild right base atelectasis. Lungs elsewhere clear. Stable cardiac prominence. Aortic Atherosclerosis (ICD10-I70.0). Electronically Signed   By: Bretta Bang III M.D.   On: 03/08/2020 08:47   Scheduled Meds: Marland Kitchen (  feeding supplement) PROSource Plus  30 mL Oral BID BM  . enoxaparin (LOVENOX) injection  30 mg Subcutaneous Q24H  . famotidine  20 mg Oral Daily  . feeding supplement  237 mL Oral TID BM  . metoprolol tartrate  12.5 mg Oral BID  . mometasone-formoterol  2 puff Inhalation BID  . multivitamin with minerals  1 tablet Oral Daily  . nicotine  21 mg Transdermal Daily  . predniSONE  40 mg Oral Q breakfast  . QUEtiapine  25 mg Oral QHS   Continuous Infusions:   LOS: 12 days   Merlene Laughtermair Latif Lenward Able, DO [facility] PAGER is on AMION  If 7PM-7AM, please contact night-coverage [email or web address]

## 2020-03-09 NOTE — Progress Notes (Signed)
At approximately 2020 pt became aggressive and verbally lashing out to staff. Saying "I need to leave and get my wallet and you cant hold me here". Pt made multiple attempts at trying to get past staff and exit via the room door. Pt reoriented to place, time and situation with no improvement in pt's behavior and actions. Haldol administered via PRN order. Will cont to monitor.

## 2020-03-10 DIAGNOSIS — J9601 Acute respiratory failure with hypoxia: Secondary | ICD-10-CM | POA: Diagnosis not present

## 2020-03-10 LAB — COMPREHENSIVE METABOLIC PANEL
ALT: 40 U/L (ref 0–44)
AST: 44 U/L — ABNORMAL HIGH (ref 15–41)
Albumin: 2.4 g/dL — ABNORMAL LOW (ref 3.5–5.0)
Alkaline Phosphatase: 38 U/L (ref 38–126)
Anion gap: 14 (ref 5–15)
BUN: 52 mg/dL — ABNORMAL HIGH (ref 8–23)
CO2: 26 mmol/L (ref 22–32)
Calcium: 8.7 mg/dL — ABNORMAL LOW (ref 8.9–10.3)
Chloride: 97 mmol/L — ABNORMAL LOW (ref 98–111)
Creatinine, Ser: 0.96 mg/dL (ref 0.44–1.00)
GFR, Estimated: >60 mL/min (ref >60)
Glucose, Bld: 87 mg/dL (ref 70–99)
Potassium: 5.6 mmol/L — ABNORMAL HIGH (ref 3.5–5.1)
Sodium: 137 mmol/L (ref 135–145)
Total Bilirubin: 0.9 mg/dL (ref 0.3–1.2)
Total Protein: 4.8 g/dL — ABNORMAL LOW (ref 6.5–8.1)

## 2020-03-10 LAB — CBC WITH DIFFERENTIAL/PLATELET
Abs Immature Granulocytes: 0.04 10*3/uL (ref 0.00–0.07)
Basophils Absolute: 0 10*3/uL (ref 0.0–0.1)
Basophils Relative: 0 %
Eosinophils Absolute: 0 10*3/uL (ref 0.0–0.5)
Eosinophils Relative: 0 %
HCT: 39.4 % (ref 36.0–46.0)
Hemoglobin: 12.6 g/dL (ref 12.0–15.0)
Immature Granulocytes: 1 %
Lymphocytes Relative: 7 %
Lymphs Abs: 0.6 10*3/uL — ABNORMAL LOW (ref 0.7–4.0)
MCH: 29.5 pg (ref 26.0–34.0)
MCHC: 32 g/dL (ref 30.0–36.0)
MCV: 92.3 fL (ref 80.0–100.0)
Monocytes Absolute: 0.4 10*3/uL (ref 0.1–1.0)
Monocytes Relative: 4 %
Neutro Abs: 7.8 10*3/uL — ABNORMAL HIGH (ref 1.7–7.7)
Neutrophils Relative %: 88 %
Platelets: 183 10*3/uL (ref 150–400)
RBC: 4.27 MIL/uL (ref 3.87–5.11)
RDW: 16.9 % — ABNORMAL HIGH (ref 11.5–15.5)
WBC: 8.9 10*3/uL (ref 4.0–10.5)
nRBC: 0 % (ref 0.0–0.2)

## 2020-03-10 LAB — PHOSPHORUS: Phosphorus: 4.1 mg/dL (ref 2.5–4.6)

## 2020-03-10 LAB — MAGNESIUM: Magnesium: 2.2 mg/dL (ref 1.7–2.4)

## 2020-03-10 MED ORDER — CARVEDILOL 6.25 MG PO TABS
6.2500 mg | ORAL_TABLET | Freq: Two times a day (BID) | ORAL | Status: DC
  Administered 2020-03-10 – 2020-03-12 (×2): 6.25 mg via ORAL
  Filled 2020-03-10 (×4): qty 1

## 2020-03-10 MED ORDER — SODIUM POLYSTYRENE SULFONATE 15 GM/60ML PO SUSP
30.0000 g | Freq: Once | ORAL | Status: AC
  Administered 2020-03-10: 30 g via ORAL
  Filled 2020-03-10: qty 120

## 2020-03-10 MED ORDER — CARVEDILOL 3.125 MG PO TABS: 3.1250 mg | ORAL_TABLET | Freq: Two times a day (BID) | ORAL | Status: DC

## 2020-03-10 MED ORDER — PREDNISONE 5 MG PO TABS: 10.0000 mg | ORAL_TABLET | Freq: Every day | ORAL | Status: DC

## 2020-03-10 MED ORDER — HYDRALAZINE HCL 50 MG PO TABS
50.0000 mg | ORAL_TABLET | Freq: Two times a day (BID) | ORAL | Status: DC
  Administered 2020-03-10 – 2020-03-12 (×5): 50 mg via ORAL
  Filled 2020-03-10 (×5): qty 1

## 2020-03-10 MED ORDER — SODIUM ZIRCONIUM CYCLOSILICATE 10 G PO PACK
10.0000 g | PACK | Freq: Two times a day (BID) | ORAL | Status: AC
  Administered 2020-03-10 (×2): 10 g via ORAL
  Filled 2020-03-10 (×2): qty 1

## 2020-03-10 MED ORDER — PREDNISONE 20 MG PO TABS: 20.0000 mg | ORAL_TABLET | Freq: Every day | ORAL | Status: DC

## 2020-03-10 NOTE — Care Management Important Message (Signed)
Important Message  Patient Details  Name: Patricia Terrell MRN: [id number] Date of Birth: [birth year]   Medicare Important Message Given:  Yes - Important Message mailed due to current National Emergency  Verbal consent obtained due to current National Emergency  Relationship to patient: Self Contact Name: Lelania Call Date: 03/10/20  Time: 1518 Phone: [phone] Outcome: Spoke with contact Important Message mailed to: Patient address on file    Orson Aloe 03/10/2020, 3:18 PM

## 2020-03-10 NOTE — Progress Notes (Signed)
PROGRESS NOTE                                                                                                                                                                                                             Patient Demographics:    Patricia Terrell, is a 80 y.o. female, DOB - 09/14/1940, PPJ:[id number]  Outpatient Primary MD for the patient is Pcp, No    LOS - 13  Admit date - 02/24/2020    Chief Complaint  Patient presents with  . Psychiatric Evaluation  . Altered Mental Status       Brief Narrative (HPI from H&P)   80 year old African-American female with past medical history significant for but not limited to dementia, COPD/emphysema, hypertension as well as other comorbidities was admitted from 02/13/20-02/16/20 for COPD exacerbation and was found to be Covid positive at that time, she was treated with 3 days of remdesivir and discharged home, she came back to the hospital on 02/26/2020 with shortness of breath.  This time she was diagnosed with worsening COVID-19 pneumonia along with acute on chronic diastolic CHF and admitted to the hospital.  She developed worsening encephalopathy and became aggressive, psych was consulted and currently she has been IVC to be placed to inpatient psych facility. She was transferred to my care on 03/10/2020 on day 13 of her hospital stay.  Of note patient has moved from [place] with her daughter 1 month ago, according to the daughter patient was having some memory issues for a while at [place] she is unaware of having any behavioral issues there.   Subjective:    Patricia Terrell today has, No headache, No chest pain, No abdominal pain - No Nausea, No new weakness tingling or numbness, no shortness of breath.   Assessment  & Plan :   1. Acute Hypoxic Resp. Failure due to Acute Covid 19 Viral Pneumonitis lung with acute on chronic diastolic CHF EF 75% on recent echocardiogram -she  has finished her COVID specific treatment, steroids stopped on 03/10/2020, she has been adequately diuresed with as needed Lasix.  Currently on supportive care with breathing treatments as needed, symptom-free on room air, has been seen by cardiology this admission.  Encouraged the patient to sit up in chair in the daytime use I-S and flutter valve for pulmonary toiletry and then prone in  bed when at night.  Will advance activity and titrate down oxygen as possible.  Recent Labs  Lab 03/06/20 0249 03/07/20 0249 03/08/20 0356 03/09/20 0415 03/10/20 0123  WBC 10.0 10.4 8.9 7.3 8.9  HGB 11.9* 12.5 12.4 11.9* 12.6  HCT 38.2 40.4 40.6 36.9 39.4  PLT 214 197 229 201 183  BNP 918.7*  --   --   --   --   AST 55* 58* 49* 39 44*  ALT 57* 52* 47* 43 40  ALKPHOS 36* 38 39 32* 38  BILITOT 0.7 0.6 0.9 0.7 0.9  ALBUMIN 2.3* 2.4* 2.4* 2.3* 2.4*    Her initial Covid positive date is 02/13/2020, she is clearly out of her quarantine.    2.  Severe LVH consistent with hypertrophic cardiomyopathy.  Kindly review echo, seen by cardiology, recommend beta-blocker, placed on moderate dose Coreg will monitor.  Avoid Imdur or Norvasc.  3.  Hyperkalemia.  Treated on 03/10/2020.  Stopped steroids.  4.  CKD 3A.  Creatinine at or better than baseline.  5.  Smoking.  Counseled to quit, currently on NicoDerm patch.  6.  Hypertension.  For now Coreg and monitor.  7.  Dementia with metabolic encephalopathy.  Seen by psych this admission, currently on Seroquel along with as needed Haldol. IVCD, awaits inpatient psych placement.   8.  Anemia of chronic disease.  Monitor.  9.  Moderate protein calorie malnutrition.  On protein supplements.  10. GERD.  On Pepcid.       Condition - Extremely Guarded  Family Communication  : Daughter Patricia Terrell [phone] on 03/10/2020  Code Status :  DNR  Consults  :  Psych, cardiology  PUD Prophylaxis :     Procedures  :     TTE -  1. Severe concentric LVH with cavity  obliteration of mid cavity and apex with systole consistent with hypertrophic cardiomyopathy Turbulent flow through LV/LVOT but no significant obstruction at rest. . Left ventricular ejection fraction, by estimation,  is 70 to 75%. The left ventricle has hyperdynamic function. The left ventricle has no regional wall motion abnormalities. Left ventricular diastolic parameters are consistent with Grade I diastolic dysfunction (impaired relaxation).  2. Right ventricular systolic function is normal. The right ventricular size is normal. There is normal pulmonary artery systolic pressure.  3. A small pericardial effusion is present.  4. The mitral valve is abnormal.  5. The inferior vena cava is normal in size with greater than 50% respiratory variability, suggesting right atrial pressure of 3 mmHg  CT head - Non acute      Disposition Plan  :    Status is: Inpatient  Remains inpatient appropriate because:Unsafe d/c plan   Dispo: The patient is from: Home              [facility]              Anticipated d/c date is: 1 day              Patient currently is medically stable to d/c.   Difficult to place patient Yes  DVT Prophylaxis  :  Lovenox    Lab Results  Component Value Date   PLT 183 03/10/2020    Diet :  Diet Order            Diet Heart Room service appropriate? Yes; Fluid consistency: Thin  Diet effective now  Inpatient Medications  Scheduled Meds: . (feeding supplement) PROSource Plus  30 mL Oral BID BM  . enoxaparin (LOVENOX) injection  30 mg Subcutaneous Q24H  . famotidine  20 mg Oral Daily  . feeding supplement  237 mL Oral TID BM  . metoprolol tartrate  12.5 mg Oral BID  . mometasone-formoterol  2 puff Inhalation BID  . multivitamin with minerals  1 tablet Oral Daily  . nicotine  21 mg Transdermal Daily  . polyethylene glycol  17 g Oral BID  . QUEtiapine  25 mg Oral QHS  . senna-docusate  1 tablet Oral BID  . sodium polystyrene   30 g Oral Once  . sodium zirconium cyclosilicate  10 g Oral BID   Continuous Infusions: PRN Meds:.acetaminophen, albuterol, alum & mag hydroxide-simeth, haloperidol lactate, [DISCONTINUED] ondansetron **OR** ondansetron (ZOFRAN) IV  Antibiotics  :    Anti-infectives (From admission, onward)   None       Time Spent in minutes  30   Susa Raring M.D on 03/10/2020 at 11:45 AM  To page go to [email or web address]   [facility] -  Office  [phone]    See all Orders from today for further details    Objective:   Vitals:   03/09/20 0530 03/09/20 1417 03/09/20 1948 03/10/20 0556  BP: (!) 173/98 128/77 (!) 154/87 (!) 164/73  Pulse: (!) 57 62 69 60  Resp: Temp: 98.1 F (36.7 C) 98.5 F (36.9 C) 98.3 F (36.8 C) 98.5 F (36.9 C)  TempSrc: Oral Oral Axillary Axillary  SpO2: 92% 91% 90% 96%  Weight:      Height:        Wt Readings from Last 3 Encounters:  02/26/20 48 kg  02/16/20 48 kg     Intake/Output Summary (Last 24 hours) at 03/10/2020 1145 Last data filed at 03/10/2020 0835 Gross per 24 hour  Intake 120 ml  Output -  Net 120 ml     Physical Exam  Awake pleasantly confused, no focal deficits, [place].AT,PERRAL Supple Neck,No JVD, No cervical lymphadenopathy appriciated.  Symmetrical Chest wall movement, Good air movement bilaterally, CTAB RRR,No Gallops,Rubs or new Murmurs, No Parasternal Heave +ve B.Sounds, Abd Soft, No tenderness, No organomegaly appriciated, No rebound - guarding or rigidity. No Cyanosis, Clubbing or edema, No new Rash or bruise       Data Review:    CBC Recent Labs  Lab 03/06/20 0249 03/07/20 0249 03/08/20 0356 03/09/20 0415 03/10/20 0123  WBC 10.0 10.4 8.9 7.3 8.9  HGB 11.9* 12.5 12.4 11.9* 12.6  HCT 38.2 40.4 40.6 36.9 39.4  PLT 214 197 229 201 183  MCV 93.4 92.4 93.1 92.0 92.3  MCH 29.1 28.6 28.4 29.7 29.5  MCHC 31.2 30.9 30.5 32.2 32.0  RDW 16.1* 16.3* 16.6* 16.8* 16.9*  LYMPHSABS 0.7 0.6* 1.0 0.7 0.6*   MONOABS 0.3 0.5 0.5 0.4 0.4  EOSABS 0.0 0.0 0.0 0.0 0.0  BASOSABS 0.0 0.0 0.0 0.0 0.0    Recent Labs  Lab 03/06/20 0249 03/07/20 0249 03/08/20 0356 03/09/20 0415 03/10/20 0123  NA 137 137 138 139 137  K 4.6 4.7 4.7 4.7 5.6*  CL 91* 88* 89* 95* 97*  CO2 37* 40* 41* 37* 26  GLUCOSE 112* 101* 89 89 87  BUN 60* 66* 64* 55* 52*  CREATININE 1.19* 1.25* 1.66* 1.32* 0.96  CALCIUM 9.0 9.4 9.1 8.7* 8.7*  AST 55* 58* 49* 39 44*  ALT 57* 52* 47* 43 40  ALKPHOS  36* 38 39 32* 38  BILITOT 0.7 0.6 0.9 0.7 0.9  ALBUMIN 2.3* 2.4* 2.4* 2.3* 2.4*  MG 2.0 2.0 2.0 2.1 2.2  TSH 0.337*  --   --   --   --   BNP 918.7*  --   --   --   --     ------------------------------------------------------------------------------------------------------------------ No results for input(s): CHOL, HDL, LDLCALC, TRIG, CHOLHDL, LDLDIRECT in the last 72 hours.  No results found for: HGBA1C ------------------------------------------------------------------------------------------------------------------ No results for input(s): TSH, T4TOTAL, T3FREE, THYROIDAB in the last 72 hours.  Invalid input(s): FREET3  Cardiac Enzymes No results for input(s): CKMB, TROPONINI, MYOGLOBIN in the last 168 hours.  Invalid input(s): CK ------------------------------------------------------------------------------------------------------------------    Component Value Date/Time   BNP 918.7 (H) 03/06/2020 0249    Micro Results No results found for this or any previous visit (from the past 240 hour(s)).  Radiology Reports DG Chest 2 View  Result Date: 02/25/2020 CLINICAL DATA:  Shortness of breath EXAM: CHEST - 2 VIEW COMPARISON:  CT 02/13/2020, radiograph 02/12/2020 FINDINGS: Extensive reticulonodular opacities throughout the lungs, much of which is attributable to the extensive calcified pleural plaque seen on comparison CT and admixed regions of granulomata. Additionally, some coarsened interstitial and bronchitic  features are compatible with emphysema better demonstrated on the cross-sectional imaging. There are however some increasing areas of more hazy and patchy opacity in both lungs, particularly towards the lung bases which could reflect developing infection or edema. No pneumothorax. No visible pleural effusion. Stable cardiomediastinal contours with a calcified, tortuous aorta. No acute osseous or soft tissue abnormality. IMPRESSION: 1. Increasing areas of hazy and patchy opacity in both lungs, particularly towards the lung bases which could reflect developing infection or edema. 2. Redemonstrated reticulonodular opacities compatible with a combination of calcified pleural plaque and calcified granulomata seen on comparison study as well as features chronic bronchitis and emphysema also present prior cross-sectional imaging. Electronically Signed   By: Kreg Shropshire M.D.   On: 02/25/2020 00:03   CT Head Wo Contrast  Result Date: 02/25/2020 CLINICAL DATA:  Worsening dementia and hallucinations EXAM: CT HEAD WITHOUT CONTRAST TECHNIQUE: Contiguous axial images were obtained from the base of the skull through the vertex without intravenous contrast. COMPARISON:  None. FINDINGS: Brain: No evidence of acute infarction, hemorrhage, hydrocephalus, extra-axial collection or mass lesion/mass effect. Mild atrophic changes are noted. Vascular: No hyperdense vessel or unexpected calcification. Skull: Normal. Negative for fracture or focal lesion. Sinuses/Orbits: No acute finding. Other: None. IMPRESSION: Mild atrophic changes without acute abnormality. Electronically Signed   By: Alcide Clever M.D.   On: 02/25/2020 00:59   CT Angio Chest PE W/Cm &/Or Wo Cm  Result Date: 02/13/2020 CLINICAL DATA:  Shortness of breath EXAM: CT ANGIOGRAPHY CHEST WITH CONTRAST TECHNIQUE: Multidetector CT imaging of the chest was performed using the standard protocol during bolus administration of intravenous contrast. Multiplanar CT image  reconstructions and MIPs were obtained to evaluate the vascular anatomy. CONTRAST:  70mL OMNIPAQUE IOHEXOL 350 MG/ML SOLN COMPARISON:  Chest x-ray from earlier in the same day. FINDINGS: Cardiovascular: Thoracic aorta demonstrates atherosclerotic calcification without aneurysmal dilatation. The pulmonary artery shows a normal branching pattern bilaterally. No filling defect to suggest pulmonary embolism is noted. Cardiac enlargement is seen. The coronary arteries demonstrate scattered atherosclerotic change. Mediastinum/Nodes: Thoracic inlet is within normal limits. No sizable hilar or mediastinal adenopathy is noted. The esophagus as visualized is within normal limits. Lungs/Pleura: Lungs demonstrate diffuse emphysematous change with multiple calcified pleural plaques and calcified granulomas  bilaterally. No focal infiltrate or sizable effusion is seen. Upper Abdomen: Visualized upper abdomen shows no acute abnormality. Musculoskeletal: Degenerative changes of the thoracic spine. No acute bony abnormality is noted. Review of the MIP images confirms the above findings. 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 No acute infiltrate is seen. No evidence of pulmonary emboli. Aortic Atherosclerosis (ICD10-I70.0) and Emphysema (ICD10-J43.9). Electronically Signed   By: Alcide Clever M.D.   On: 02/13/2020 17:39   DG CHEST PORT 1 VIEW  Result Date: 03/08/2020 CLINICAL DATA:  Shortness of breath.  COVID-19 positive EXAM: PORTABLE CHEST 1 VIEW COMPARISON:  March 06, 2020 FINDINGS: Multiple calcified granulomas are noted throughout the lungs. There is slight right base atelectasis. Lungs elsewhere are clear. Stable cardiac prominence is noted. The pulmonary vascularity is within normal limits. No adenopathy. There is aortic atherosclerosis. No bone lesions. IMPRESSION: Calcified granulomas throughout lungs are stable. Mild right base  atelectasis. Lungs elsewhere clear. Stable cardiac prominence. Aortic Atherosclerosis (ICD10-I70.0). Electronically Signed   By: Bretta Bang III M.D.   On: 03/08/2020 08:47   DG CHEST PORT 1 VIEW  Result Date: 03/06/2020 CLINICAL DATA:  Shortness of breath.  COVID-19 positive EXAM: PORTABLE CHEST 1 VIEW COMPARISON:  March 05, 2020 FINDINGS: Multiple calcified granulomas are noted throughout the lungs. There is atelectasis in the lung bases. There is an equivocal small right pleural effusion. Heart is enlarged with pulmonary vascularity normal. There is aortic atherosclerosis. No bone lesions. IMPRESSION: Bibasilar atelectasis with equivocal right pleural effusion. No consolidation. Multiple calcified granulomas throughout the lungs. Stable cardiomegaly. Aortic Atherosclerosis (ICD10-I70.0). Electronically Signed   By: Bretta Bang III M.D.   On: 03/06/2020 09:27   DG CHEST PORT 1 VIEW  Result Date: 03/05/2020 CLINICAL DATA:  80 year old female with shortness of breath. Emphysema. Calcified pleural disease. EXAM: PORTABLE CHEST 1 VIEW COMPARISON:  Portable chest 03/04/2020 and earlier. FINDINGS: Portable AP upright view at 0437 hours. Rotated to the right today. Calcified aortic atherosclerosis. Stable cardiomegaly and mediastinal contours. Coarse and calcified bilateral lung opacity is stable. No pneumothorax or pleural effusion identified. No areas of worsening ventilation allowing for patient rotation. No acute osseous abnormality identified. IMPRESSION: 1. Chronic lung disease and cardiomegaly. 2.  No acute cardiopulmonary abnormality identified. Electronically Signed   By: Odessa Fleming M.D.   On: 03/05/2020 06:43   DG CHEST PORT 1 VIEW  Result Date: 03/04/2020 CLINICAL DATA:  80 year old female with shortness of breath. EXAM: PORTABLE CHEST 1 VIEW COMPARISON:  Chest radiograph dated 02/26/2020. FINDINGS: Bilateral calcified pleural plaques. There is background of emphysema. No new  consolidation. There is no pleural effusion pneumothorax. There is moderate cardiomegaly. Atherosclerotic calcification of the aorta. No acute osseous pathology. IMPRESSION: 1. No acute cardiopulmonary process. 2. Emphysema and calcified pleural plaques. Electronically Signed   By: Elgie Collard M.D.   On: 03/04/2020 17:32   DG Chest Port 1 View  Result Date: 02/26/2020 CLINICAL DATA:  Hypoxia, COVID-19 positivity EXAM: PORTABLE CHEST 1 VIEW COMPARISON:  02/24/2020 FINDINGS: Cardiac shadow is stable. Tortuous thoracic aorta with calcification is noted. Patient rotation to the right accentuates the mediastinal markings. Persistent patchy airspace opacities are noted similar to that seen on the prior exam but slightly increased in the right base. Multiple calcified nodular densities are again seen throughout both lungs stable in appearance from the prior exam related to pleural plaques. IMPRESSION: Patchy airspace opacities superimposed over chronic calcified pleural plaques. Electronically Signed  By: Alcide Clever M.D.   On: 02/26/2020 02:38   DG Chest Port 1 View  Result Date: 02/13/2020 CLINICAL DATA:  Short of breath for 1 week, altered level of consciousness EXAM: PORTABLE CHEST 1 VIEW COMPARISON:  None. FINDINGS: Single frontal view of the chest demonstrates an enlarged cardiac silhouette. There are numerous calcified nodules and calcified pleural plaques seen bilaterally. No acute airspace disease, effusion, or pneumothorax. No acute bony abnormality. IMPRESSION: 1. Numerous calcified nodules and bilateral pleural plaques. Comparison with any prior studies would be useful. Findings could reflect previous granulomatous disease and/or asbestos exposure. 2. No acute airspace disease. Electronically Signed   By: Sharlet Salina M.D.   On: 02/13/2020 15:40   ECHOCARDIOGRAM LIMITED  Result Date: 02/26/2020    ECHOCARDIOGRAM LIMITED REPORT   Patient Name:   SEANA UNDERWOOD Date of Exam: 02/26/2020 Medical  Rec #:  [id number]      Height:       65.0 in Accession #:    [id number]     Weight:       105.8 lb Date of Birth:  1940-04-24       BSA:          1.509 m Patient Age:    79 years       BP:           124/84 mmHg Patient Gender: F              HR:           55 bpm. Exam Location:  Inpatient Procedure: Limited Echo, Color Doppler and Cardiac Doppler Indications:    acute diastolic chf  History:        Patient has no prior history of Echocardiogram examinations.                 CHF, COPD and Covid, Signs/Symptoms:dementia; Risk                 Factors:Hypertension.  Sonographer:    Delcie Roch Referring Phys: [id number] COSTIN M GHERGHE IMPRESSIONS  1. Severe concentric LVH with cavity obliteration of mid cavity and apex with systole consistent with hypertrophic cardiomyopathy Turbulent flow through LV/LVOT but no significant obstruction at rest. . Left ventricular ejection fraction, by estimation,  is 70 to 75%. The left ventricle has hyperdynamic function. The left ventricle has no regional wall motion abnormalities. Left ventricular diastolic parameters are consistent with Grade I diastolic dysfunction (impaired relaxation).  2. Right ventricular systolic function is normal. The right ventricular size is normal. There is normal pulmonary artery systolic pressure.  3. A small pericardial effusion is present.  4. The mitral valve is abnormal.  5. The inferior vena cava is normal in size with greater than 50% respiratory variability, suggesting right atrial pressure of 3 mmHg. FINDINGS  Left Ventricle: Severe concentric LVH with cavity obliteration of mid cavity and apex with systole consistent with hypertrophic cardiomyopathy Turbulent flow through LV/LVOT but no significant obstruction at rest. Left ventricular ejection fraction, by estimation, is 70 to 75%. The left ventricle has hyperdynamic function. The left ventricle has no regional wall motion abnormalities. The left ventricular internal cavity size was small.  There is concentric left ventricular hypertrophy. Left ventricular diastolic parameters are consistent with Grade I diastolic dysfunction (impaired relaxation). Right Ventricle: The right ventricular size is normal. Right vetricular wall thickness was not assessed. Right ventricular systolic function is normal. There is normal pulmonary artery systolic pressure. The tricuspid regurgitant velocity is 2.52 m/s,  and with an assumed right atrial pressure of 3 mmHg, the estimated right ventricular systolic pressure is 28.4 mmHg. Left Atrium: Left atrial size was normal in size. Right Atrium: Right atrial size was normal in size. Pericardium: A small pericardial effusion is present. Mitral Valve: The mitral valve is abnormal. There is mild thickening of the mitral valve leaflet(s). Tricuspid Valve: The tricuspid valve is grossly normal. Tricuspid valve regurgitation is mild. Pulmonic Valve: The pulmonic valve was grossly normal. Venous: The inferior vena cava is normal in size with greater than 50% respiratory variability, suggesting right atrial pressure of 3 mmHg. LEFT VENTRICLE PLAX 2D LVIDd:         3.00 cm  Diastology LVIDs:         1.70 cm  LV e' medial:    3.26 cm/s LV PW:         1.50 cm  LV E/e' medial:  13.0 LV IVS:        1.70 cm  LV e' lateral:   4.79 cm/s LVOT diam:     1.90 cm  LV E/e' lateral: 8.9 LV SV:         82 LV SV Index:   54 LVOT Area:     2.84 cm  IVC IVC diam: 1.20 cm LEFT ATRIUM         Index LA diam:    3.80 cm 2.52 cm/m  AORTIC VALVE LVOT Vmax:   170.00 cm/s LVOT Vmean:  102.000 cm/s LVOT VTI:    0.288 m  AORTA Ao Asc diam: 3.10 cm MITRAL VALVE               TRICUSPID VALVE MV Area (PHT): 2.87 cm    TR Peak grad:   25.4 mmHg MV Decel Time: 264 msec    TR Vmax:        252.00 cm/s MV E velocity: 42.40 cm/s MV A velocity: 81.40 cm/s  SHUNTS MV E/A ratio:  0.52        Systemic VTI:  0.29 m                            Systemic Diam: 1.90 cm Dietrich Pates MD Electronically signed by Dietrich Pates MD  Signature Date/Time: 02/26/2020/9:40:19 PM    Final

## 2020-03-10 NOTE — TOC Progression Note (Signed)
Transition of Care [facility]) - Progression Note    Patient Details  Name: Patricia Terrell MRN: [id number] Date of Birth: 29-May-1940  Transition of Care [facility]) CM/SW Contact  [place] Pink, Student-Social Work Phone Number: 03/10/2020, 3:05 PM  Clinical Narrative:    CSW intern called the following facilities to inquire about bed availability for a geriatric psych patient also on oxygen: [address], [facility], [address], [place], [place], [address] (left voicemail), [place], Insurance account manager (left voicemail), [facility] ([place]), and [place]. CSW intern faxxed referrals to [facility] and [facility] as they indicated that they could accept a geriatric patient on oxygen. CSW intern will continue to follow for discharge planning needs.    Expected Discharge Plan: Psychiatric Hospital Barriers to Discharge: Psych Bed not available  Expected Discharge Plan and Services Expected Discharge Plan: Psychiatric Hospital In-house Referral: Clinical Social Work   Post Acute Care Choice: Home Health Living arrangements for the past 2 months: Apartment                                       Social Determinants of Health (SDOH) Interventions    Readmission Risk Interventions No flowsheet data found.

## 2020-03-10 NOTE — Progress Notes (Signed)
Attempted to call report. Nurse will call back when ready. Callback number left with Diplomatic Services operational officer.

## 2020-03-10 NOTE — Progress Notes (Signed)
Upon arrival to floor pt.'s BP was 181/87, MD notified, verbal order given. Medication given.

## 2020-03-11 DIAGNOSIS — J9601 Acute respiratory failure with hypoxia: Secondary | ICD-10-CM | POA: Diagnosis not present

## 2020-03-11 LAB — BASIC METABOLIC PANEL
Anion gap: 9 (ref 5–15)
BUN: 30 mg/dL — ABNORMAL HIGH (ref 8–23)
CO2: 36 mmol/L — ABNORMAL HIGH (ref 22–32)
Calcium: 8.8 mg/dL — ABNORMAL LOW (ref 8.9–10.3)
Chloride: 95 mmol/L — ABNORMAL LOW (ref 98–111)
Creatinine, Ser: 0.92 mg/dL (ref 0.44–1.00)
GFR, Estimated: >60 mL/min (ref >60)
Glucose, Bld: 94 mg/dL (ref 70–99)
Potassium: 3.3 mmol/L — ABNORMAL LOW (ref 3.5–5.1)
Sodium: 140 mmol/L (ref 135–145)

## 2020-03-11 MED ORDER — POTASSIUM CHLORIDE CRYS ER 20 MEQ PO TBCR
40.0000 meq | EXTENDED_RELEASE_TABLET | Freq: Once | ORAL | Status: AC
  Administered 2020-03-11: 40 meq via ORAL
  Filled 2020-03-11: qty 2

## 2020-03-11 NOTE — Progress Notes (Signed)
CSW faxxed most recent psych note to [facility] [phone]) for further review.

## 2020-03-11 NOTE — Plan of Care (Signed)

## 2020-03-11 NOTE — Progress Notes (Signed)
PROGRESS NOTE                                                                                                                                                                                                             Patient Demographics:    Patricia Terrell, is a 80 y.o. female, DOB - [birth year], ZOX:[id number]  Outpatient Primary MD for the patient is Pcp, No    LOS - 14  Admit date - 02/24/2020    Chief Complaint  Patient presents with  . Psychiatric Evaluation  . Altered Mental Status       Brief Narrative (HPI from H&P)   80 year old African-American female with past medical history significant for but not limited to dementia, COPD/emphysema, hypertension as well as other comorbidities was admitted from 02/13/20-02/16/20 for COPD exacerbation and was found to be Covid positive at that time, she was treated with 3 days of remdesivir and discharged home, she came back to the hospital on 02/26/2020 with shortness of breath.  This time she was diagnosed with worsening COVID-19 pneumonia along with acute on chronic diastolic CHF and admitted to the hospital.  She developed worsening encephalopathy and became aggressive, psych was consulted and currently she has been IVC to be placed to inpatient psych facility. She was transferred to my care on 03/10/2020 on day 13 of her hospital stay.  Of note patient has moved from [place] with her daughter 1 month ago, according to the daughter patient was having some memory issues for a while at [place] she is unaware of having any behavioral issues there.   Subjective:   Patient in bed, appears comfortable, denies any headache, no fever, no chest pain or pressure, no shortness of breath , no abdominal pain. No focal weakness.   Assessment  & Plan :   1. Acute Hypoxic Resp. Failure due to Acute Covid 19 Viral Pneumonitis lung with acute on chronic diastolic CHF EF 75% on recent  echocardiogram -she has finished her COVID specific treatment, steroids stopped on 03/10/2020, she has been adequately diuresed with as needed Lasix.  Currently on supportive care with breathing treatments as needed, symptom-free on room air, has been seen by cardiology this admission.  Encouraged the patient to sit up in chair in the daytime use I-S and flutter valve for pulmonary toiletry and then prone in  bed when at night.  Will advance activity and titrate down oxygen as possible.  Recent Labs  Lab 03/06/20 0249 03/07/20 0249 03/08/20 0356 03/09/20 0415 03/10/20 0123  WBC 10.0 10.4 8.9 7.3 8.9  HGB 11.9* 12.5 12.4 11.9* 12.6  HCT 38.2 40.4 40.6 36.9 39.4  PLT 214 197 229 201 183  BNP 918.7*  --   --   --   --   AST 55* 58* 49* 39 44*  ALT 57* 52* 47* 43 40  ALKPHOS 36* 38 39 32* 38  BILITOT 0.7 0.6 0.9 0.7 0.9  ALBUMIN 2.3* 2.4* 2.4* 2.3* 2.4*    Her initial Covid positive date is 02/13/2020, she is clearly out of her quarantine.    2.  Severe LVH consistent with hypertrophic cardiomyopathy.  Kindly review echo, seen by cardiology, recommend beta-blocker, placed on moderate dose Coreg will monitor.  Avoid Imdur or Norvasc.  3.  Hyperkalemia.  Treated on 03/10/2020.  Stopped steroids.  4.  CKD 3A.  Creatinine at or better than baseline.  5.  Smoking.  Counseled to quit, currently on NicoDerm patch.  6.  Hypertension.  On Coreg, added hydralazine for better control, continue to monitor.  7.  Dementia with metabolic encephalopathy.  Seen by psych this admission, currently on Seroquel along with as needed Haldol. IVC in place, awaits inpatient psych placement.   8.  Anemia of chronic disease.  Monitor.  9.  Moderate protein calorie malnutrition.  On protein supplements.  10. GERD.  On Pepcid.       Condition - Extremely Guarded  Family Communication  : [name] [phone] on 03/10/2020  Code Status :  DNR  Consults  :  Psych, cardiology  PUD Prophylaxis :      Procedures  :     TTE -  1. Severe concentric LVH with cavity obliteration of mid cavity and apex with systole consistent with hypertrophic cardiomyopathy Turbulent flow through LV/LVOT but no significant obstruction at rest. . Left ventricular ejection fraction, by estimation,  is 70 to 75%. The left ventricle has hyperdynamic function. The left ventricle has no regional wall motion abnormalities. Left ventricular diastolic parameters are consistent with Grade I diastolic dysfunction (impaired relaxation).  2. Right ventricular systolic function is normal. The right ventricular size is normal. There is normal pulmonary artery systolic pressure.  3. A small pericardial effusion is present.  4. The mitral valve is abnormal.  5. The inferior vena cava is normal in size with greater than 50% respiratory variability, suggesting right atrial pressure of 3 mmHg  CT head - Non acute      Disposition Plan  :    Status is: Inpatient  Remains inpatient appropriate because:Unsafe d/c plan   Dispo: The patient is from: Home              Anticipated d/c is to: [facility]              Anticipated d/c date is: 1 day              Patient currently is medically stable to d/c.   Difficult to place patient Yes  DVT Prophylaxis  :  Lovenox    Lab Results  Component Value Date   PLT 183 03/10/2020    Diet :  Diet Order            Diet Heart Room service appropriate? Yes; Fluid consistency: Thin  Diet effective now  Inpatient Medications  Scheduled Meds: . (feeding supplement) PROSource Plus  30 mL Oral BID BM  . carvedilol  6.25 mg Oral BID WC  . enoxaparin (LOVENOX) injection  30 mg Subcutaneous Q24H  . famotidine  20 mg Oral Daily  . feeding supplement  237 mL Oral TID BM  . hydrALAZINE  50 mg Oral BID  . mometasone-formoterol  2 puff Inhalation BID  . multivitamin with minerals  1 tablet Oral Daily  . nicotine  21 mg Transdermal Daily  . polyethylene glycol  17 g Oral  BID  . QUEtiapine  25 mg Oral QHS  . senna-docusate  1 tablet Oral BID   Continuous Infusions: PRN Meds:.acetaminophen, albuterol, alum & mag hydroxide-simeth, [DISCONTINUED] ondansetron **OR** ondansetron (ZOFRAN) IV  Antibiotics  :    Anti-infectives (From admission, onward)   None       Time Spent in minutes  30   Susa RaringPrashant Singh M.D on 03/11/2020 at 9:07 AM  To page go to [email or web address]   [facility] -  Office  [phone]    See all Orders from today for further details    Objective:   Vitals:   03/10/20 1823 03/10/20 1827 03/11/20 0736 03/11/20 0829  BP: (!) 181/87 (!) 181/87 (!) 158/67   Pulse: 69 69 (!) 58 62  Resp: 18 16 16    Temp: 98.6 F (37 C) 98.6 F (37 C) 98.3 F (36.8 C)   TempSrc: Oral Oral Oral   SpO2: 100% 100% 95%   Weight:      Height:        Wt Readings from Last 3 Encounters:  02/26/20 48 kg  02/16/20 48 kg     Intake/Output Summary (Last 24 hours) at 03/11/2020 [id number] Last data filed at 03/10/2020 1319 Gross per 24 hour  Intake 200 ml  Output -  Net 200 ml     Physical Exam  Sleeping comfortably in bed, easily arousable, calm, no focal deficits [place].AT,PERRAL Supple Neck,No JVD, No cervical lymphadenopathy appriciated.  Symmetrical Chest wall movement, Good air movement bilaterally, CTAB RRR,No Gallops, Rubs or new Murmurs, No Parasternal Heave +ve B.Sounds, Abd Soft, No tenderness, No organomegaly appriciated, No rebound - guarding or rigidity. No Cyanosis, Clubbing or edema, No new Rash or bruise      Data Review:    CBC Recent Labs  Lab 03/06/20 0249 03/07/20 0249 03/08/20 0356 03/09/20 0415 03/10/20 0123  WBC 10.0 10.4 8.9 7.3 8.9  HGB 11.9* 12.5 12.4 11.9* 12.6  HCT 38.2 40.4 40.6 36.9 39.4  PLT 214 197 229 201 183  MCV 93.4 92.4 93.1 92.0 92.3  MCH 29.1 28.6 28.4 29.7 29.5  MCHC 31.2 30.9 30.5 32.2 32.0  RDW 16.1* 16.3* 16.6* 16.8* 16.9*  LYMPHSABS 0.7 0.6* 1.0 0.7 0.6*  MONOABS 0.3 0.5 0.5 0.4 0.4   EOSABS 0.0 0.0 0.0 0.0 0.0  BASOSABS 0.0 0.0 0.0 0.0 0.0    Recent Labs  Lab 03/06/20 0249 03/07/20 0249 03/08/20 0356 03/09/20 0415 03/10/20 0123 03/11/20 0213  NA 137 137 138 139 137 140  K 4.6 4.7 4.7 4.7 5.6* 3.3*  CL 91* 88* 89* 95* 97* 95*  CO2 37* 40* 41* 37* 26 36*  GLUCOSE 112* 101* 89 89 87 94  BUN 60* 66* 64* 55* 52* 30*  CREATININE 1.19* 1.25* 1.66* 1.32* 0.96 0.92  CALCIUM 9.0 9.4 9.1 8.7* 8.7* 8.8*  AST 55* 58* 49* 39 44*  --   ALT 57* 52* 47* 43 40  --  ALKPHOS 36* 38 39 32* 38  --   BILITOT 0.7 0.6 0.9 0.7 0.9  --   ALBUMIN 2.3* 2.4* 2.4* 2.3* 2.4*  --   MG 2.0 2.0 2.0 2.1 2.2  --   TSH 0.337*  --   --   --   --   --   BNP 918.7*  --   --   --   --   --     ------------------------------------------------------------------------------------------------------------------ No results for input(s): CHOL, HDL, LDLCALC, TRIG, CHOLHDL, LDLDIRECT in the last 72 hours.  No results found for: HGBA1C ------------------------------------------------------------------------------------------------------------------ No results for input(s): TSH, T4TOTAL, T3FREE, THYROIDAB in the last 72 hours.  Invalid input(s): FREET3  Cardiac Enzymes No results for input(s): CKMB, TROPONINI, MYOGLOBIN in the last 168 hours.  Invalid input(s): CK ------------------------------------------------------------------------------------------------------------------    Component Value Date/Time   BNP 918.7 (H) 03/06/2020 0249    Micro Results No results found for this or any previous visit (from the past 240 hour(s)).  Radiology Reports DG Chest 2 View  Result Date: 02/25/2020 CLINICAL DATA:  Shortness of breath EXAM: CHEST - 2 VIEW COMPARISON:  CT 02/13/2020, radiograph 02/12/2020 FINDINGS: Extensive reticulonodular opacities throughout the lungs, much of which is attributable to the extensive calcified pleural plaque seen on comparison CT and admixed regions of granulomata.  Additionally, some coarsened interstitial and bronchitic features are compatible with emphysema better demonstrated on the cross-sectional imaging. There are however some increasing areas of more hazy and patchy opacity in both lungs, particularly towards the lung bases which could reflect developing infection or edema. No pneumothorax. No visible pleural effusion. Stable cardiomediastinal contours with a calcified, tortuous aorta. No acute osseous or soft tissue abnormality. IMPRESSION: 1. Increasing areas of hazy and patchy opacity in both lungs, particularly towards the lung bases which could reflect developing infection or edema. 2. Redemonstrated reticulonodular opacities compatible with a combination of calcified pleural plaque and calcified granulomata seen on comparison study as well as features chronic bronchitis and emphysema also present prior cross-sectional imaging. Electronically Signed   By: Kreg Shropshire M.D.   On: 02/25/2020 00:03   CT Head Wo Contrast  Result Date: 02/25/2020 CLINICAL DATA:  Worsening dementia and hallucinations EXAM: CT HEAD WITHOUT CONTRAST TECHNIQUE: Contiguous axial images were obtained from the base of the skull through the vertex without intravenous contrast. COMPARISON:  None. FINDINGS: Brain: No evidence of acute infarction, hemorrhage, hydrocephalus, extra-axial collection or mass lesion/mass effect. Mild atrophic changes are noted. Vascular: No hyperdense vessel or unexpected calcification. Skull: Normal. Negative for fracture or focal lesion. Sinuses/Orbits: No acute finding. Other: None. IMPRESSION: Mild atrophic changes without acute abnormality. Electronically Signed   By: Alcide Clever M.D.   On: 02/25/2020 00:59   CT Angio Chest PE W/Cm &/Or Wo Cm  Result Date: 02/13/2020 CLINICAL DATA:  Shortness of breath EXAM: CT ANGIOGRAPHY CHEST WITH CONTRAST TECHNIQUE: Multidetector CT imaging of the chest was performed using the standard protocol during bolus  administration of intravenous contrast. Multiplanar CT image reconstructions and MIPs were obtained to evaluate the vascular anatomy. CONTRAST:  53mL OMNIPAQUE IOHEXOL 350 MG/ML SOLN COMPARISON:  Chest x-ray from earlier in the same day. FINDINGS: Cardiovascular: Thoracic aorta demonstrates atherosclerotic calcification without aneurysmal dilatation. The pulmonary artery shows a normal branching pattern bilaterally. No filling defect to suggest pulmonary embolism is noted. Cardiac enlargement is seen. The coronary arteries demonstrate scattered atherosclerotic change. Mediastinum/Nodes: Thoracic inlet is within normal limits. No sizable hilar or mediastinal adenopathy is noted. The esophagus as  visualized is within normal limits. Lungs/Pleura: Lungs demonstrate diffuse emphysematous change with multiple calcified pleural plaques and calcified granulomas bilaterally. No focal infiltrate or sizable effusion is seen. Upper Abdomen: Visualized upper abdomen shows no acute abnormality. Musculoskeletal: Degenerative changes of the thoracic spine. No acute bony abnormality is noted. Review of the MIP images confirms the above findings. 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 No acute infiltrate is seen. No evidence of pulmonary emboli. Aortic Atherosclerosis (ICD10-I70.0) and Emphysema (ICD10-J43.9). Electronically Signed   By: Alcide Clever M.D.   On: 02/13/2020 17:39   DG CHEST PORT 1 VIEW  Result Date: 03/08/2020 CLINICAL DATA:  Shortness of breath.  COVID-19 positive EXAM: PORTABLE CHEST 1 VIEW COMPARISON:  March 06, 2020 FINDINGS: Multiple calcified granulomas are noted throughout the lungs. There is slight right base atelectasis. Lungs elsewhere are clear. Stable cardiac prominence is noted. The pulmonary vascularity is within normal limits. No adenopathy. There is aortic atherosclerosis. No bone lesions. IMPRESSION: Calcified  granulomas throughout lungs are stable. Mild right base atelectasis. Lungs elsewhere clear. Stable cardiac prominence. Aortic Atherosclerosis (ICD10-I70.0). Electronically Signed   By: Bretta Bang III M.D.   On: 03/08/2020 08:47   DG CHEST PORT 1 VIEW  Result Date: 03/06/2020 CLINICAL DATA:  Shortness of breath.  COVID-19 positive EXAM: PORTABLE CHEST 1 VIEW COMPARISON:  March 05, 2020 FINDINGS: Multiple calcified granulomas are noted throughout the lungs. There is atelectasis in the lung bases. There is an equivocal small right pleural effusion. Heart is enlarged with pulmonary vascularity normal. There is aortic atherosclerosis. No bone lesions. IMPRESSION: Bibasilar atelectasis with equivocal right pleural effusion. No consolidation. Multiple calcified granulomas throughout the lungs. Stable cardiomegaly. Aortic Atherosclerosis (ICD10-I70.0). Electronically Signed   By: Bretta Bang III M.D.   On: 03/06/2020 09:27   DG CHEST PORT 1 VIEW  Result Date: 03/05/2020 CLINICAL DATA:  80 year old female with shortness of breath. Emphysema. Calcified pleural disease. EXAM: PORTABLE CHEST 1 VIEW COMPARISON:  Portable chest 03/04/2020 and earlier. FINDINGS: Portable AP upright view at 0437 hours. Rotated to the right today. Calcified aortic atherosclerosis. Stable cardiomegaly and mediastinal contours. Coarse and calcified bilateral lung opacity is stable. No pneumothorax or pleural effusion identified. No areas of worsening ventilation allowing for patient rotation. No acute osseous abnormality identified. IMPRESSION: 1. Chronic lung disease and cardiomegaly. 2.  No acute cardiopulmonary abnormality identified. Electronically Signed   By: Odessa Fleming M.D.   On: 03/05/2020 06:43   DG CHEST PORT 1 VIEW  Result Date: 03/04/2020 CLINICAL DATA:  80 year old female with shortness of breath. EXAM: PORTABLE CHEST 1 VIEW COMPARISON:  Chest radiograph dated 02/26/2020. FINDINGS: Bilateral calcified pleural  plaques. There is background of emphysema. No new consolidation. There is no pleural effusion pneumothorax. There is moderate cardiomegaly. Atherosclerotic calcification of the aorta. No acute osseous pathology. IMPRESSION: 1. No acute cardiopulmonary process. 2. Emphysema and calcified pleural plaques. Electronically Signed   By: Elgie Collard M.D.   On: 03/04/2020 17:32   DG Chest Port 1 View  Result Date: 02/26/2020 CLINICAL DATA:  Hypoxia, COVID-19 positivity EXAM: PORTABLE CHEST 1 VIEW COMPARISON:  02/24/2020 FINDINGS: Cardiac shadow is stable. Tortuous thoracic aorta with calcification is noted. Patient rotation to the right accentuates the mediastinal markings. Persistent patchy airspace opacities are noted similar to that seen on the prior exam but slightly increased in the right base. Multiple calcified nodular densities are again seen throughout both lungs stable in appearance from  the prior exam related to pleural plaques. IMPRESSION: Patchy airspace opacities superimposed over chronic calcified pleural plaques. Electronically Signed   By: Alcide Clever M.D.   On: 02/26/2020 02:38   DG Chest Port 1 View  Result Date: 02/13/2020 CLINICAL DATA:  Short of breath for 1 week, altered level of consciousness EXAM: PORTABLE CHEST 1 VIEW COMPARISON:  None. FINDINGS: Single frontal view of the chest demonstrates an enlarged cardiac silhouette. There are numerous calcified nodules and calcified pleural plaques seen bilaterally. No acute airspace disease, effusion, or pneumothorax. No acute bony abnormality. IMPRESSION: 1. Numerous calcified nodules and bilateral pleural plaques. Comparison with any prior studies would be useful. Findings could reflect previous granulomatous disease and/or asbestos exposure. 2. No acute airspace disease. Electronically Signed   By: Sharlet Salina M.D.   On: 02/13/2020 15:40   ECHOCARDIOGRAM LIMITED  Result Date: 02/26/2020    ECHOCARDIOGRAM LIMITED REPORT   Patient Name:    TRINIDI TOPPINS Date of Exam: 02/26/2020 Medical Rec #:  [id number]      Height:       65.0 in Accession #:    [id number]     Weight:       105.8 lb Date of Birth:  Nov 12, 1940       BSA:          1.509 m Patient Age:    79 years       BP:           124/84 mmHg Patient Gender: F              HR:           55 bpm. Exam Location:  Inpatient Procedure: Limited Echo, Color Doppler and Cardiac Doppler Indications:    acute diastolic chf  History:        Patient has no prior history of Echocardiogram examinations.                 CHF, COPD and Covid, Signs/Symptoms:dementia; Risk                 Factors:Hypertension.  Sonographer:    Delcie Roch Referring Phys: [id number] COSTIN M GHERGHE IMPRESSIONS  1. Severe concentric LVH with cavity obliteration of mid cavity and apex with systole consistent with hypertrophic cardiomyopathy Turbulent flow through LV/LVOT but no significant obstruction at rest. . Left ventricular ejection fraction, by estimation,  is 70 to 75%. The left ventricle has hyperdynamic function. The left ventricle has no regional wall motion abnormalities. Left ventricular diastolic parameters are consistent with Grade I diastolic dysfunction (impaired relaxation).  2. Right ventricular systolic function is normal. The right ventricular size is normal. There is normal pulmonary artery systolic pressure.  3. A small pericardial effusion is present.  4. The mitral valve is abnormal.  5. The inferior vena cava is normal in size with greater than 50% respiratory variability, suggesting right atrial pressure of 3 mmHg. FINDINGS  Left Ventricle: Severe concentric LVH with cavity obliteration of mid cavity and apex with systole consistent with hypertrophic cardiomyopathy Turbulent flow through LV/LVOT but no significant obstruction at rest. Left ventricular ejection fraction, by estimation, is 70 to 75%. The left ventricle has hyperdynamic function. The left ventricle has no regional wall motion abnormalities. The left  ventricular internal cavity size was small. There is concentric left ventricular hypertrophy. Left ventricular diastolic parameters are consistent with Grade I diastolic dysfunction (impaired relaxation). Right Ventricle: The right ventricular size is normal. Right vetricular wall thickness was not  assessed. Right ventricular systolic function is normal. There is normal pulmonary artery systolic pressure. The tricuspid regurgitant velocity is 2.52 m/s, and with an assumed right atrial pressure of 3 mmHg, the estimated right ventricular systolic pressure is 28.4 mmHg. Left Atrium: Left atrial size was normal in size. Right Atrium: Right atrial size was normal in size. Pericardium: A small pericardial effusion is present. Mitral Valve: The mitral valve is abnormal. There is mild thickening of the mitral valve leaflet(s). Tricuspid Valve: The tricuspid valve is grossly normal. Tricuspid valve regurgitation is mild. Pulmonic Valve: The pulmonic valve was grossly normal. Venous: The inferior vena cava is normal in size with greater than 50% respiratory variability, suggesting right atrial pressure of 3 mmHg. LEFT VENTRICLE PLAX 2D LVIDd:         3.00 cm  Diastology LVIDs:         1.70 cm  LV e' medial:    3.26 cm/s LV PW:         1.50 cm  LV E/e' medial:  13.0 LV IVS:        1.70 cm  LV e' lateral:   4.79 cm/s LVOT diam:     1.90 cm  LV E/e' lateral: 8.9 LV SV:         82 LV SV Index:   54 LVOT Area:     2.84 cm  IVC IVC diam: 1.20 cm LEFT ATRIUM         Index LA diam:    3.80 cm 2.52 cm/m  AORTIC VALVE LVOT Vmax:   170.00 cm/s LVOT Vmean:  102.000 cm/s LVOT VTI:    0.288 m  AORTA Ao Asc diam: 3.10 cm MITRAL VALVE               TRICUSPID VALVE MV Area (PHT): 2.87 cm    TR Peak grad:   25.4 mmHg MV Decel Time: 264 msec    TR Vmax:        252.00 cm/s MV E velocity: 42.40 cm/s MV A velocity: 81.40 cm/s  SHUNTS MV E/A ratio:  0.52        Systemic VTI:  0.29 m                            Systemic Diam: 1.90 cm Dietrich Pates MD  Electronically signed by Dietrich Pates MD Signature Date/Time: 02/26/2020/9:40:19 PM    Final
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
## 2020-03-12 DIAGNOSIS — J9601 Acute respiratory failure with hypoxia: Secondary | ICD-10-CM | POA: Diagnosis not present

## 2020-03-12 DIAGNOSIS — E43 Unspecified severe protein-calorie malnutrition: Secondary | ICD-10-CM | POA: Diagnosis present

## 2020-03-12 DIAGNOSIS — F0281 Dementia in other diseases classified elsewhere with behavioral disturbance: Secondary | ICD-10-CM | POA: Diagnosis not present

## 2020-03-12 LAB — BASIC METABOLIC PANEL
Anion gap: 9 (ref 5–15)
BUN: 32 mg/dL — ABNORMAL HIGH (ref 8–23)
CO2: 37 mmol/L — ABNORMAL HIGH (ref 22–32)
Calcium: 9.2 mg/dL (ref 8.9–10.3)
Chloride: 95 mmol/L — ABNORMAL LOW (ref 98–111)
Creatinine, Ser: 1.14 mg/dL — ABNORMAL HIGH (ref 0.44–1.00)
GFR, Estimated: 49 mL/min — ABNORMAL LOW (ref >60)
Glucose, Bld: 91 mg/dL (ref 70–99)
Potassium: 4.2 mmol/L (ref 3.5–5.1)
Sodium: 141 mmol/L (ref 135–145)

## 2020-03-12 MED ORDER — HALOPERIDOL LACTATE 5 MG/ML IJ SOLN
2.0000 mg | Freq: Three times a day (TID) | INTRAMUSCULAR | Status: DC | PRN
  Administered 2020-03-12 – 2020-03-14 (×3): 2 mg via INTRAVENOUS
  Filled 2020-03-12 (×4): qty 1

## 2020-03-12 MED ORDER — FUROSEMIDE 40 MG PO TABS
40.0000 mg | ORAL_TABLET | Freq: Once | ORAL | Status: AC
  Administered 2020-03-12: 40 mg via ORAL
  Filled 2020-03-12: qty 1

## 2020-03-12 NOTE — Progress Notes (Signed)
PROGRESS NOTE                                                                                                                                                                                                             Patient Demographics:    Patricia Terrell, is a 80 y.o. female, DOB - 1940/03/09, SHF:[id number]  Outpatient Primary MD for the patient is Pcp, No    LOS - 15  Admit date - 02/24/2020    Chief Complaint  Patient presents with  . Psychiatric Evaluation  . Altered Mental Status       Brief Narrative (HPI from H&P)   80 year old African-American female with past medical history significant for but not limited to dementia, COPD/emphysema, hypertension as well as other comorbidities was admitted from 02/13/20-02/16/20 for COPD exacerbation and was found to be Covid positive at that time, she was treated with 3 days of remdesivir and discharged home, she came back to the hospital on 02/26/2020 with shortness of breath.  This time she was diagnosed with worsening COVID-19 pneumonia along with acute on chronic diastolic CHF and admitted to the hospital.  She developed worsening encephalopathy and became aggressive, psych was consulted and currently she has been IVC to be placed to inpatient psych facility. She was transferred to my care on 03/10/2020 on day 13 of her hospital stay.  Of note patient has moved from [place] with her daughter 1 month ago, according to the daughter patient was having some memory issues for a while at [place] she is unaware of having any behavioral issues there.   Subjective:   Patient in chair, she is calm and comfortable denies any headache chest or abdominal pain.  No shortness of breath.   Assessment  & Plan :   1. Acute Hypoxic Resp. Failure due to Acute Covid 19 Viral Pneumonitis lung with acute on chronic diastolic CHF EF 75% on recent echocardiogram -she has finished her COVID specific  treatment, steroids stopped on 03/10/2020, she has been adequately diuresed with as needed Lasix.  Currently on supportive care with breathing treatments as needed, symptom-free on room air, has been seen by cardiology this admission.  Encouraged the patient to sit up in chair in the daytime use I-S and flutter valve for pulmonary toiletry and then prone in bed when at night.  Will  advance activity and titrate down oxygen as possible.  Recent Labs  Lab 03/06/20 0249 03/07/20 0249 03/08/20 0356 03/09/20 0415 03/10/20 0123  WBC 10.0 10.4 8.9 7.3 8.9  HGB 11.9* 12.5 12.4 11.9* 12.6  HCT 38.2 40.4 40.6 36.9 39.4  PLT 214 197 229 201 183  BNP 918.7*  --   --   --   --   AST 55* 58* 49* 39 44*  ALT 57* 52* 47* 43 40  ALKPHOS 36* 38 39 32* 38  BILITOT 0.7 0.6 0.9 0.7 0.9  ALBUMIN 2.3* 2.4* 2.4* 2.3* 2.4*    Her initial Covid positive date is 02/13/2020, she is clearly out of her quarantine.    2.  Severe LVH consistent with hypertrophic cardiomyopathy.  Kindly review echo, seen by cardiology, recommend beta-blocker, placed on moderate dose Coreg will monitor.  Avoid Imdur or Norvasc. Edema ++ on 03/12/20 - Lasix with TEDs.  3.  Hyperkalemia.  Treated on 03/10/2020.  Stopped steroids.  4.  CKD 3A.  Creatinine at or better than baseline.  5.  Smoking.  Counseled to quit, currently on NicoDerm patch.  6.  Hypertension.  On Coreg, added hydralazine for better control, continue to monitor.  7.  Dementia with metabolic encephalopathy.  Seen by psych this admission, currently on Seroquel along with as needed Haldol. IVC in place, awaits inpatient psych placement.   8.  Anemia of chronic disease.  Monitor.  9.  Moderate protein calorie malnutrition.  On protein supplements.  10. GERD.  On Pepcid.       Condition - Extremely Guarded  Family Communication  : Daughter Patricia Terrell [phone] on 03/10/2020  Code Status :  DNR  Consults  :  Psych, cardiology  PUD Prophylaxis :      Procedures  :     TTE -  1. Severe concentric LVH with cavity obliteration of mid cavity and apex with systole consistent with hypertrophic cardiomyopathy Turbulent flow through LV/LVOT but no significant obstruction at rest. . Left ventricular ejection fraction, by estimation,  is 70 to 75%. The left ventricle has hyperdynamic function. The left ventricle has no regional wall motion abnormalities. Left ventricular diastolic parameters are consistent with Grade I diastolic dysfunction (impaired relaxation).  2. Right ventricular systolic function is normal. The right ventricular size is normal. There is normal pulmonary artery systolic pressure.  3. A small pericardial effusion is present.  4. The mitral valve is abnormal.  5. The inferior vena cava is normal in size with greater than 50% respiratory variability, suggesting right atrial pressure of 3 mmHg  CT head - Non acute      Disposition Plan  :    Status is: Inpatient  Remains inpatient appropriate because:Unsafe d/c plan   Dispo: The patient is from: Home              Anticipated d/c is to: [facility]              Anticipated d/c date is: 1 day              Patient currently is medically stable to d/c.   Difficult to place patient Yes  DVT Prophylaxis  :  Lovenox    Lab Results  Component Value Date   PLT 183 03/10/2020    Diet :  Diet Order            Diet Heart Room service appropriate? Yes; Fluid consistency: Thin  Diet effective now  Inpatient Medications  Scheduled Meds: . (feeding supplement) PROSource Plus  30 mL Oral BID BM  . carvedilol  6.25 mg Oral BID WC  . enoxaparin (LOVENOX) injection  30 mg Subcutaneous Q24H  . famotidine  20 mg Oral Daily  . feeding supplement  237 mL Oral TID BM  . furosemide  40 mg Oral Once  . hydrALAZINE  50 mg Oral BID  . mometasone-formoterol  2 puff Inhalation BID  . multivitamin with minerals  1 tablet Oral Daily  . nicotine  21 mg Transdermal Daily  .  polyethylene glycol  17 g Oral BID  . QUEtiapine  25 mg Oral QHS  . senna-docusate  1 tablet Oral BID   Continuous Infusions: PRN Meds:.acetaminophen, albuterol, alum & mag hydroxide-simeth, [DISCONTINUED] ondansetron **OR** ondansetron (ZOFRAN) IV  Antibiotics  :    Anti-infectives (From admission, onward)   None       Time Spent in minutes  30   Susa Raring M.D on 03/12/2020 at 8:47 AM  To page go to [email or web address]   [facility] -  Office  [phone]    See all Orders from today for further details    Objective:   Vitals:   03/11/20 1700 03/11/20 1958 03/11/20 2042 03/12/20 0739  BP: (!) 133/91  (!) 161/70 (!) 146/81  Pulse: (!) 47 82 77 63  Resp: Temp: 98 F (36.7 C)  98.1 F (36.7 C) 98.2 F (36.8 C)  TempSrc: Oral  Oral   SpO2: 90% 97% 95% (!) 88%  Weight:      Height:        Wt Readings from Last 3 Encounters:  02/26/20 48 kg  02/16/20 48 kg    No intake or output data in the 24 hours ending 03/12/20 0847   Physical Exam  Awake but pleasantly confused [place].AT,PERRAL Supple Neck,No JVD, No cervical lymphadenopathy appriciated.  Symmetrical Chest wall movement, Good air movement bilaterally, CTAB RRR,No Gallops, Rubs or new Murmurs, No Parasternal Heave +ve B.Sounds, Abd Soft, No tenderness, No organomegaly appriciated, No rebound - guarding or rigidity. No Cyanosis, + leg edema       Data Review:    CBC Recent Labs  Lab 03/06/20 0249 03/07/20 0249 03/08/20 0356 03/09/20 0415 03/10/20 0123  WBC 10.0 10.4 8.9 7.3 8.9  HGB 11.9* 12.5 12.4 11.9* 12.6  HCT 38.2 40.4 40.6 36.9 39.4  PLT 214 197 229 201 183  MCV 93.4 92.4 93.1 92.0 92.3  MCH 29.1 28.6 28.4 29.7 29.5  MCHC 31.2 30.9 30.5 32.2 32.0  RDW 16.1* 16.3* 16.6* 16.8* 16.9*  LYMPHSABS 0.7 0.6* 1.0 0.7 0.6*  MONOABS 0.3 0.5 0.5 0.4 0.4  EOSABS 0.0 0.0 0.0 0.0 0.0  BASOSABS 0.0 0.0 0.0 0.0 0.0    Recent Labs  Lab 03/06/20 0249 03/07/20 0249  03/08/20 0356 03/09/20 0415 03/10/20 0123 03/11/20 0213 03/12/20 0439  NA 137 137 138 139 137 140 141  K 4.6 4.7 4.7 4.7 5.6* 3.3* 4.2  CL 91* 88* 89* 95* 97* 95* 95*  CO2 37* 40* 41* 37* 26 36* 37*  GLUCOSE 112* 101* 89 89 87 94 91  BUN 60* 66* 64* 55* 52* 30* 32*  CREATININE 1.19* 1.25* 1.66* 1.32* 0.96 0.92 1.14*  CALCIUM 9.0 9.4 9.1 8.7* 8.7* 8.8* 9.2  AST 55* 58* 49* 39 44*  --   --   ALT 57* 52* 47* 43 40  --   --   ALKPHOS 36*  38 39 32* 38  --   --   BILITOT 0.7 0.6 0.9 0.7 0.9  --   --   ALBUMIN 2.3* 2.4* 2.4* 2.3* 2.4*  --   --   MG 2.0 2.0 2.0 2.1 2.2  --   --   TSH 0.337*  --   --   --   --   --   --   BNP 918.7*  --   --   --   --   --   --     ------------------------------------------------------------------------------------------------------------------ No results for input(s): CHOL, HDL, LDLCALC, TRIG, CHOLHDL, LDLDIRECT in the last 72 hours.  No results found for: HGBA1C ------------------------------------------------------------------------------------------------------------------ No results for input(s): TSH, T4TOTAL, T3FREE, THYROIDAB in the last 72 hours.  Invalid input(s): FREET3  Cardiac Enzymes No results for input(s): CKMB, TROPONINI, MYOGLOBIN in the last 168 hours.  Invalid input(s): CK ------------------------------------------------------------------------------------------------------------------    Component Value Date/Time   BNP 918.7 (H) 03/06/2020 0249    Micro Results No results found for this or any previous visit (from the past 240 hour(s)).  Radiology Reports DG Chest 2 View  Result Date: 02/25/2020 CLINICAL DATA:  Shortness of breath EXAM: CHEST - 2 VIEW COMPARISON:  CT 02/13/2020, radiograph 02/12/2020 FINDINGS: Extensive reticulonodular opacities throughout the lungs, much of which is attributable to the extensive calcified pleural plaque seen on comparison CT and admixed regions of granulomata. Additionally, some coarsened  interstitial and bronchitic features are compatible with emphysema better demonstrated on the cross-sectional imaging. There are however some increasing areas of more hazy and patchy opacity in both lungs, particularly towards the lung bases which could reflect developing infection or edema. No pneumothorax. No visible pleural effusion. Stable cardiomediastinal contours with a calcified, tortuous aorta. No acute osseous or soft tissue abnormality. IMPRESSION: 1. Increasing areas of hazy and patchy opacity in both lungs, particularly towards the lung bases which could reflect developing infection or edema. 2. Redemonstrated reticulonodular opacities compatible with a combination of calcified pleural plaque and calcified granulomata seen on comparison study as well as features chronic bronchitis and emphysema also present prior cross-sectional imaging. Electronically Signed   By: Kreg Shropshire M.D.   On: 02/25/2020 00:03   CT Head Wo Contrast  Result Date: 02/25/2020 CLINICAL DATA:  Worsening dementia and hallucinations EXAM: CT HEAD WITHOUT CONTRAST TECHNIQUE: Contiguous axial images were obtained from the base of the skull through the vertex without intravenous contrast. COMPARISON:  None. FINDINGS: Brain: No evidence of acute infarction, hemorrhage, hydrocephalus, extra-axial collection or mass lesion/mass effect. Mild atrophic changes are noted. Vascular: No hyperdense vessel or unexpected calcification. Skull: Normal. Negative for fracture or focal lesion. Sinuses/Orbits: No acute finding. Other: None. IMPRESSION: Mild atrophic changes without acute abnormality. Electronically Signed   By: Alcide Clever M.D.   On: 02/25/2020 00:59   CT Angio Chest PE W/Cm &/Or Wo Cm  Result Date: 02/13/2020 CLINICAL DATA:  Shortness of breath EXAM: CT ANGIOGRAPHY CHEST WITH CONTRAST TECHNIQUE: Multidetector CT imaging of the chest was performed using the standard protocol during bolus administration of intravenous contrast.  Multiplanar CT image reconstructions and MIPs were obtained to evaluate the vascular anatomy. CONTRAST:  66mL OMNIPAQUE IOHEXOL 350 MG/ML SOLN COMPARISON:  Chest x-ray from earlier in the same day. FINDINGS: Cardiovascular: Thoracic aorta demonstrates atherosclerotic calcification without aneurysmal dilatation. The pulmonary artery shows a normal branching pattern bilaterally. No filling defect to suggest pulmonary embolism is noted. Cardiac enlargement is seen. The coronary arteries demonstrate scattered atherosclerotic change. Mediastinum/Nodes: Thoracic  inlet is within normal limits. No sizable hilar or mediastinal adenopathy is noted. The esophagus as visualized is within normal limits. Lungs/Pleura: Lungs demonstrate diffuse emphysematous change with multiple calcified pleural plaques and calcified granulomas bilaterally. No focal infiltrate or sizable effusion is seen. Upper Abdomen: Visualized upper abdomen shows no acute abnormality. Musculoskeletal: Degenerative changes of the thoracic spine. No acute bony abnormality is noted. Review of the MIP images confirms the above findings. 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 No acute infiltrate is seen. No evidence of pulmonary emboli. Aortic Atherosclerosis (ICD10-I70.0) and Emphysema (ICD10-J43.9). Electronically Signed   By: Alcide Clever M.D.   On: 02/13/2020 17:39   DG CHEST PORT 1 VIEW  Result Date: 03/08/2020 CLINICAL DATA:  Shortness of breath.  COVID-19 positive EXAM: PORTABLE CHEST 1 VIEW COMPARISON:  March 06, 2020 FINDINGS: Multiple calcified granulomas are noted throughout the lungs. There is slight right base atelectasis. Lungs elsewhere are clear. Stable cardiac prominence is noted. The pulmonary vascularity is within normal limits. No adenopathy. There is aortic atherosclerosis. No bone lesions. IMPRESSION: Calcified granulomas throughout lungs are stable.  Mild right base atelectasis. Lungs elsewhere clear. Stable cardiac prominence. Aortic Atherosclerosis (ICD10-I70.0). Electronically Signed   By: Bretta Bang III M.D.   On: 03/08/2020 08:47   DG CHEST PORT 1 VIEW  Result Date: 03/06/2020 CLINICAL DATA:  Shortness of breath.  COVID-19 positive EXAM: PORTABLE CHEST 1 VIEW COMPARISON:  March 05, 2020 FINDINGS: Multiple calcified granulomas are noted throughout the lungs. There is atelectasis in the lung bases. There is an equivocal small right pleural effusion. Heart is enlarged with pulmonary vascularity normal. There is aortic atherosclerosis. No bone lesions. IMPRESSION: Bibasilar atelectasis with equivocal right pleural effusion. No consolidation. Multiple calcified granulomas throughout the lungs. Stable cardiomegaly. Aortic Atherosclerosis (ICD10-I70.0). Electronically Signed   By: Bretta Bang III M.D.   On: 03/06/2020 09:27   DG CHEST PORT 1 VIEW  Result Date: 03/05/2020 CLINICAL DATA:  80 year old female with shortness of breath. Emphysema. Calcified pleural disease. EXAM: PORTABLE CHEST 1 VIEW COMPARISON:  Portable chest 03/04/2020 and earlier. FINDINGS: Portable AP upright view at 0437 hours. Rotated to the right today. Calcified aortic atherosclerosis. Stable cardiomegaly and mediastinal contours. Coarse and calcified bilateral lung opacity is stable. No pneumothorax or pleural effusion identified. No areas of worsening ventilation allowing for patient rotation. No acute osseous abnormality identified. IMPRESSION: 1. Chronic lung disease and cardiomegaly. 2.  No acute cardiopulmonary abnormality identified. Electronically Signed   By: Odessa Fleming M.D.   On: 03/05/2020 06:43   DG CHEST PORT 1 VIEW  Result Date: 03/04/2020 CLINICAL DATA:  80 year old female with shortness of breath. EXAM: PORTABLE CHEST 1 VIEW COMPARISON:  Chest radiograph dated 02/26/2020. FINDINGS: Bilateral calcified pleural plaques. There is background of emphysema.  No new consolidation. There is no pleural effusion pneumothorax. There is moderate cardiomegaly. Atherosclerotic calcification of the aorta. No acute osseous pathology. IMPRESSION: 1. No acute cardiopulmonary process. 2. Emphysema and calcified pleural plaques. Electronically Signed   By: Elgie Collard M.D.   On: 03/04/2020 17:32   DG Chest Port 1 View  Result Date: 02/26/2020 CLINICAL DATA:  Hypoxia, COVID-19 positivity EXAM: PORTABLE CHEST 1 VIEW COMPARISON:  02/24/2020 FINDINGS: Cardiac shadow is stable. Tortuous thoracic aorta with calcification is noted. Patient rotation to the right accentuates the mediastinal markings. Persistent patchy airspace opacities are noted similar to that seen on the prior exam but slightly increased in the  right base. Multiple calcified nodular densities are again seen throughout both lungs stable in appearance from the prior exam related to pleural plaques. IMPRESSION: Patchy airspace opacities superimposed over chronic calcified pleural plaques. Electronically Signed   By: Alcide Clever M.D.   On: 02/26/2020 02:38   DG Chest Port 1 View  Result Date: 02/13/2020 CLINICAL DATA:  Short of breath for 1 week, altered level of consciousness EXAM: PORTABLE CHEST 1 VIEW COMPARISON:  None. FINDINGS: Single frontal view of the chest demonstrates an enlarged cardiac silhouette. There are numerous calcified nodules and calcified pleural plaques seen bilaterally. No acute airspace disease, effusion, or pneumothorax. No acute bony abnormality. IMPRESSION: 1. Numerous calcified nodules and bilateral pleural plaques. Comparison with any prior studies would be useful. Findings could reflect previous granulomatous disease and/or asbestos exposure. 2. No acute airspace disease. Electronically Signed   By: Sharlet Salina M.D.   On: 02/13/2020 15:40   ECHOCARDIOGRAM LIMITED  Result Date: 02/26/2020    ECHOCARDIOGRAM LIMITED REPORT   Patient Name:   AILA TERRA Date of Exam: 02/26/2020  Medical Rec #:  [id number]      Height:       65.0 in Accession #:    [id number]     Weight:       105.8 lb Date of Birth:  1940-10-16       BSA:          1.509 m Patient Age:    79 years       BP:           124/84 mmHg Patient Gender: F              HR:           55 bpm. Exam Location:  Inpatient Procedure: Limited Echo, Color Doppler and Cardiac Doppler Indications:    acute diastolic chf  History:        Patient has no prior history of Echocardiogram examinations.                 CHF, COPD and Covid, Signs/Symptoms:dementia; Risk                 Factors:Hypertension.  Sonographer:    Delcie Roch Referring Phys: [id number] COSTIN M GHERGHE IMPRESSIONS  1. Severe concentric LVH with cavity obliteration of mid cavity and apex with systole consistent with hypertrophic cardiomyopathy Turbulent flow through LV/LVOT but no significant obstruction at rest. . Left ventricular ejection fraction, by estimation,  is 70 to 75%. The left ventricle has hyperdynamic function. The left ventricle has no regional wall motion abnormalities. Left ventricular diastolic parameters are consistent with Grade I diastolic dysfunction (impaired relaxation).  2. Right ventricular systolic function is normal. The right ventricular size is normal. There is normal pulmonary artery systolic pressure.  3. A small pericardial effusion is present.  4. The mitral valve is abnormal.  5. The inferior vena cava is normal in size with greater than 50% respiratory variability, suggesting right atrial pressure of 3 mmHg. FINDINGS  Left Ventricle: Severe concentric LVH with cavity obliteration of mid cavity and apex with systole consistent with hypertrophic cardiomyopathy Turbulent flow through LV/LVOT but no significant obstruction at rest. Left ventricular ejection fraction, by estimation, is 70 to 75%. The left ventricle has hyperdynamic function. The left ventricle has no regional wall motion abnormalities. The left ventricular internal cavity size was  small. There is concentric left ventricular hypertrophy. Left ventricular diastolic parameters are consistent with Grade I diastolic dysfunction (  impaired relaxation). Right Ventricle: The right ventricular size is normal. Right vetricular wall thickness was not assessed. Right ventricular systolic function is normal. There is normal pulmonary artery systolic pressure. The tricuspid regurgitant velocity is 2.52 m/s, and with an assumed right atrial pressure of 3 mmHg, the estimated right ventricular systolic pressure is 28.4 mmHg. Left Atrium: Left atrial size was normal in size. Right Atrium: Right atrial size was normal in size. Pericardium: A small pericardial effusion is present. Mitral Valve: The mitral valve is abnormal. There is mild thickening of the mitral valve leaflet(s). Tricuspid Valve: The tricuspid valve is grossly normal. Tricuspid valve regurgitation is mild. Pulmonic Valve: The pulmonic valve was grossly normal. Venous: The inferior vena cava is normal in size with greater than 50% respiratory variability, suggesting right atrial pressure of 3 mmHg. LEFT VENTRICLE PLAX 2D LVIDd:         3.00 cm  Diastology LVIDs:         1.70 cm  LV e' medial:    3.26 cm/s LV PW:         1.50 cm  LV E/e' medial:  13.0 LV IVS:        1.70 cm  LV e' lateral:   4.79 cm/s LVOT diam:     1.90 cm  LV E/e' lateral: 8.9 LV SV:         82 LV SV Index:   54 LVOT Area:     2.84 cm  IVC IVC diam: 1.20 cm LEFT ATRIUM         Index LA diam:    3.80 cm 2.52 cm/m  AORTIC VALVE LVOT Vmax:   170.00 cm/s LVOT Vmean:  102.000 cm/s LVOT VTI:    0.288 m  AORTA Ao Asc diam: 3.10 cm MITRAL VALVE               TRICUSPID VALVE MV Area (PHT): 2.87 cm    TR Peak grad:   25.4 mmHg MV Decel Time: 264 msec    TR Vmax:        252.00 cm/s MV E velocity: 42.40 cm/s MV A velocity: 81.40 cm/s  SHUNTS MV E/A ratio:  0.52        Systemic VTI:  0.29 m                            Systemic Diam: 1.90 cm Dietrich PatesPaula Ross MD Electronically signed by Dietrich PatesPaula Ross  MD Signature Date/Time: 02/26/2020/9:40:19 PM    Final

## 2020-03-12 NOTE — TOC Progression Note (Signed)
Transition of Care [facility]) - Progression Note    Patient Details  Name: Patricia Terrell MRN: [id number] Date of Birth: 09-04-1940  Transition of Care [facility]) CM/SW Contact  Beckie Busing, RN Phone Number: [phone]  03/12/2020, 4:15 PM  Clinical Narrative:    Updated clinicals faxed to [facility] @ [phone]   Expected Discharge Plan: Psychiatric Hospital Barriers to Discharge: Psych Bed not available  Expected Discharge Plan and Services Expected Discharge Plan: Psychiatric Hospital In-house Referral: Clinical Social Work   Post Acute Care Choice: Home Health Living arrangements for the past 2 months: Apartment                                       Social Determinants of Health (SDOH) Interventions    Readmission Risk Interventions No flowsheet data found.

## 2020-03-13 DIAGNOSIS — J9601 Acute respiratory failure with hypoxia: Secondary | ICD-10-CM | POA: Diagnosis not present

## 2020-03-13 DIAGNOSIS — F0281 Dementia in other diseases classified elsewhere with behavioral disturbance: Secondary | ICD-10-CM | POA: Diagnosis not present

## 2020-03-13 LAB — BASIC METABOLIC PANEL
Anion gap: 12 (ref 5–15)
BUN: 38 mg/dL — ABNORMAL HIGH (ref 8–23)
CO2: 35 mmol/L — ABNORMAL HIGH (ref 22–32)
Calcium: 8.8 mg/dL — ABNORMAL LOW (ref 8.9–10.3)
Chloride: 93 mmol/L — ABNORMAL LOW (ref 98–111)
Creatinine, Ser: 1.24 mg/dL — ABNORMAL HIGH (ref 0.44–1.00)
GFR, Estimated: 44 mL/min — ABNORMAL LOW (ref >60)
Glucose, Bld: 101 mg/dL — ABNORMAL HIGH (ref 70–99)
Potassium: 4.1 mmol/L (ref 3.5–5.1)
Sodium: 140 mmol/L (ref 135–145)

## 2020-03-13 LAB — MAGNESIUM: Magnesium: 2 mg/dL (ref 1.7–2.4)

## 2020-03-13 MED ORDER — METOPROLOL TARTRATE 25 MG PO TABS
25.0000 mg | ORAL_TABLET | Freq: Two times a day (BID) | ORAL | Status: DC
  Administered 2020-03-14: 25 mg via ORAL
  Filled 2020-03-13: qty 1

## 2020-03-13 MED ORDER — LACTATED RINGERS IV BOLUS
250.0000 mL | Freq: Once | INTRAVENOUS | Status: AC
  Administered 2020-03-13: 250 mL via INTRAVENOUS

## 2020-03-13 MED ORDER — CARVEDILOL 3.125 MG PO TABS: 3.1250 mg | ORAL_TABLET | Freq: Two times a day (BID) | ORAL | Status: DC

## 2020-03-13 NOTE — TOC Progression Note (Signed)
Transition of Care [facility]) - Progression Note    Patient Details  Name: Patricia Terrell MRN: [id number] Date of Birth: May 09, 1940  Transition of Care [facility]) CM/SW Contact  Mearl Latin, LCSW Phone Number: 03/13/2020, 8:49 AM  Clinical Narrative:    CSW called [facility] and confirmed patient is still on their waitlist.    Expected Discharge Plan: Psychiatric Hospital Barriers to Discharge: Psych Bed not available  Expected Discharge Plan and Services Expected Discharge Plan: Psychiatric Hospital In-house Referral: Clinical Social Work   Post Acute Care Choice: Home Health Living arrangements for the past 2 months: Apartment                                       Social Determinants of Health (SDOH) Interventions    Readmission Risk Interventions No flowsheet data found.

## 2020-03-13 NOTE — Progress Notes (Signed)
PROGRESS NOTE                                                                                                                                                                                                             Patient Demographics:    Patricia Terrell, is a 80 y.o. female, DOB - [birth year], SLH:[id number]  Outpatient Primary MD for the patient is Pcp, No    LOS - 16  Admit date - 02/24/2020    Chief Complaint  Patient presents with  . Psychiatric Evaluation  . Altered Mental Status       Brief Narrative (HPI from H&P)   80 year old African-American female with past medical history significant for but not limited to dementia, COPD/emphysema, hypertension as well as other comorbidities was admitted from 02/13/20-02/16/20 for COPD exacerbation and was found to be Covid positive at that time, she was treated with 3 days of remdesivir and discharged home, she came back to the hospital on 02/26/2020 with shortness of breath.  This time she was diagnosed with worsening COVID-19 pneumonia along with acute on chronic diastolic CHF and admitted to the hospital.  She developed worsening encephalopathy and became aggressive, psych was consulted and currently she has been IVC to be placed to inpatient psych facility. She was transferred to my care on 03/10/2020 on day 13 of her hospital stay.  Of note patient has moved from [place] with her daughter 1 month ago, according to the daughter patient was having some memory issues for a while at [place] she is unaware of having any behavioral issues there.   Subjective:   Patient in bed, appears comfortable, denies any headache, no fever, no chest pain or pressure, no shortness of breath , no abdominal pain. No focal weakness.    Assessment  & Plan :   1. Acute Hypoxic Resp. Failure due to Acute Covid 19 Viral Pneumonitis lung with acute on chronic diastolic CHF EF 75% on recent  echocardiogram -she has finished her COVID specific treatment, steroids stopped on 03/10/2020, she has been adequately diuresed with as needed Lasix.  Currently on supportive care with breathing treatments as needed, symptom-free on room air, has been seen by cardiology this admission.  Encouraged the patient to sit up in chair in the daytime use I-S and flutter valve for pulmonary toiletry and then prone  in bed when at night.  Will advance activity and titrate down oxygen as possible.  Recent Labs  Lab 03/07/20 0249 03/08/20 0356 03/09/20 0415 03/10/20 0123  WBC 10.4 8.9 7.3 8.9  HGB 12.5 12.4 11.9* 12.6  HCT 40.4 40.6 36.9 39.4  PLT 197 229 201 183  AST 58* 49* 39 44*  ALT 52* 47* 43 40  ALKPHOS 38 39 32* 38  BILITOT 0.6 0.9 0.7 0.9  ALBUMIN 2.4* 2.4* 2.3* 2.4*    Her initial Covid positive date is 02/13/2020, she is clearly out of her quarantine.    2.  Severe LVH consistent with hypertrophic cardiomyopathy.  Kindly review echo, seen by cardiology, recommend beta-blocker, placed on moderate dose Coreg will monitor.  Avoid Imdur or Norvasc. Edema ++ on 03/12/20 - Lasix PRN as allowed by her BP with TEDs.  3.  Hyperkalemia.  Treated on 03/10/2020.  Stopped steroids.  4.  CKD 3A.  Creatinine at or better than baseline.  5.  Smoking.  Counseled to quit, currently on NicoDerm patch.  6.  Hypertension.  Pressure slightly soft hydralazine stopped, Coreg stopped, low-dose Lopressor if blood pressure allows, hold further Lasix.  7.  Dementia with metabolic encephalopathy.  Seen by psych this admission, currently on Seroquel along with as needed Haldol. IVC in place, awaits inpatient psych placement.   8.  Anemia of chronic disease.  Monitor.  9.  Moderate protein calorie malnutrition.  On protein supplements.  10. GERD.  On Pepcid.       Condition - Extremely Guarded  Family Communication  : [name] [phone] on 03/10/2020  Code Status :  DNR  Consults  :  Psych,  cardiology  PUD Prophylaxis :     Procedures  :     TTE -  1. Severe concentric LVH with cavity obliteration of mid cavity and apex with systole consistent with hypertrophic cardiomyopathy Turbulent flow through LV/LVOT but no significant obstruction at rest. . Left ventricular ejection fraction, by estimation,  is 70 to 75%. The left ventricle has hyperdynamic function. The left ventricle has no regional wall motion abnormalities. Left ventricular diastolic parameters are consistent with Grade I diastolic dysfunction (impaired relaxation).  2. Right ventricular systolic function is normal. The right ventricular size is normal. There is normal pulmonary artery systolic pressure.  3. A small pericardial effusion is present.  4. The mitral valve is abnormal.  5. The inferior vena cava is normal in size with greater than 50% respiratory variability, suggesting right atrial pressure of 3 mmHg  CT head - Non acute      Disposition Plan  :    Status is: Inpatient  Remains inpatient appropriate because:Unsafe d/c plan   Dispo: The patient is from: Home              Anticipated d/c is to: [facility]              Anticipated d/c date is: 1 day              Patient currently is medically stable to d/c.   Difficult to place patient Yes  DVT Prophylaxis  :  Lovenox    Lab Results  Component Value Date   PLT 183 03/10/2020    Diet :  Diet Order            Diet Heart Room service appropriate? Yes; Fluid consistency: Thin  Diet effective now  Inpatient Medications  Scheduled Meds: . (feeding supplement) PROSource Plus  30 mL Oral BID BM  . enoxaparin (LOVENOX) injection  30 mg Subcutaneous Q24H  . famotidine  20 mg Oral Daily  . feeding supplement  237 mL Oral TID BM  . [START ON 03/14/2020] metoprolol tartrate  25 mg Oral BID  . mometasone-formoterol  2 puff Inhalation BID  . multivitamin with minerals  1 tablet Oral Daily  . nicotine  21 mg Transdermal Daily  .  polyethylene glycol  17 g Oral BID  . QUEtiapine  25 mg Oral QHS  . senna-docusate  1 tablet Oral BID   Continuous Infusions: . lactated ringers     PRN Meds:.acetaminophen, albuterol, alum & mag hydroxide-simeth, haloperidol lactate, [DISCONTINUED] ondansetron **OR** ondansetron (ZOFRAN) IV  Antibiotics  :    Anti-infectives (From admission, onward)   None       Time Spent in minutes  30   Susa RaringPrashant Baylen Buckner M.D on 03/13/2020 at 10:47 AM  To page go to [email or web address]   [facility] -  Office  [phone]    See all Orders from today for further details    Objective:   Vitals:   03/13/20 0553 03/13/20 0920 03/13/20 0923 03/13/20 1032  BP: 105/67 (!) 99/55 (!) 99/55 (!) 91/59  Pulse: 68 74 73 68  Resp: 18 18 20    Temp: 98 F (36.7 C) 98.4 F (36.9 C) 98.4 F (36.9 C)   TempSrc: Oral Oral    SpO2: 94% 94% 94% 98%  Weight:      Height:        Wt Readings from Last 3 Encounters:  02/26/20 48 kg  02/16/20 48 kg     Intake/Output Summary (Last 24 hours) at 03/13/2020 1047 Last data filed at 03/13/2020 0447 Gross per 24 hour  Intake 480 ml  Output --  Net 480 ml     Physical Exam  Awake but pleasantly confused [place].AT,PERRAL Supple Neck,No JVD, No cervical lymphadenopathy appriciated.  Symmetrical Chest wall movement, Good air movement bilaterally, CTAB RRR,No Gallops, Rubs or new Murmurs, No Parasternal Heave +ve B.Sounds, Abd Soft, No tenderness, No organomegaly appriciated, No rebound - guarding or rigidity. No Cyanosis, + leg edema     Data Review:    CBC Recent Labs  Lab 03/07/20 0249 03/08/20 0356 03/09/20 0415 03/10/20 0123  WBC 10.4 8.9 7.3 8.9  HGB 12.5 12.4 11.9* 12.6  HCT 40.4 40.6 36.9 39.4  PLT 197 229 201 183  MCV 92.4 93.1 92.0 92.3  MCH 28.6 28.4 29.7 29.5  MCHC 30.9 30.5 32.2 32.0  RDW 16.3* 16.6* 16.8* 16.9*  LYMPHSABS 0.6* 1.0 0.7 0.6*  MONOABS 0.5 0.5 0.4 0.4  EOSABS 0.0 0.0 0.0 0.0  BASOSABS 0.0 0.0 0.0 0.0     Recent Labs  Lab 03/07/20 0249 03/08/20 0356 03/09/20 0415 03/10/20 0123 03/11/20 0213 03/12/20 0439 03/13/20 0151  NA 137 138 139 137 140 141 140  K 4.7 4.7 4.7 5.6* 3.3* 4.2 4.1  CL 88* 89* 95* 97* 95* 95* 93*  CO2 40* 41* 37* 26 36* 37* 35*  GLUCOSE 101* 89 89 87 94 91 101*  BUN 66* 64* 55* 52* 30* 32* 38*  CREATININE 1.25* 1.66* 1.32* 0.96 0.92 1.14* 1.24*  CALCIUM 9.4 9.1 8.7* 8.7* 8.8* 9.2 8.8*  AST 58* 49* 39 44*  --   --   --   ALT 52* 47* 43 40  --   --   --  ALKPHOS 38 39 32* 38  --   --   --   BILITOT 0.6 0.9 0.7 0.9  --   --   --   ALBUMIN 2.4* 2.4* 2.3* 2.4*  --   --   --   MG 2.0 2.0 2.1 2.2  --   --  2.0    ------------------------------------------------------------------------------------------------------------------ No results for input(s): CHOL, HDL, LDLCALC, TRIG, CHOLHDL, LDLDIRECT in the last 72 hours.  No results found for: HGBA1C ------------------------------------------------------------------------------------------------------------------ No results for input(s): TSH, T4TOTAL, T3FREE, THYROIDAB in the last 72 hours.  Invalid input(s): FREET3  Cardiac Enzymes No results for input(s): CKMB, TROPONINI, MYOGLOBIN in the last 168 hours.  Invalid input(s): CK ------------------------------------------------------------------------------------------------------------------    Component Value Date/Time   BNP 918.7 (H) 03/06/2020 0249    Micro Results No results found for this or any previous visit (from the past 240 hour(s)).  Radiology Reports DG Chest 2 View  Result Date: 02/25/2020 CLINICAL DATA:  Shortness of breath EXAM: CHEST - 2 VIEW COMPARISON:  CT 02/13/2020, radiograph 02/12/2020 FINDINGS: Extensive reticulonodular opacities throughout the lungs, much of which is attributable to the extensive calcified pleural plaque seen on comparison CT and admixed regions of granulomata. Additionally, some coarsened interstitial and bronchitic  features are compatible with emphysema better demonstrated on the cross-sectional imaging. There are however some increasing areas of more hazy and patchy opacity in both lungs, particularly towards the lung bases which could reflect developing infection or edema. No pneumothorax. No visible pleural effusion. Stable cardiomediastinal contours with a calcified, tortuous aorta. No acute osseous or soft tissue abnormality. IMPRESSION: 1. Increasing areas of hazy and patchy opacity in both lungs, particularly towards the lung bases which could reflect developing infection or edema. 2. Redemonstrated reticulonodular opacities compatible with a combination of calcified pleural plaque and calcified granulomata seen on comparison study as well as features chronic bronchitis and emphysema also present prior cross-sectional imaging. Electronically Signed   By: Kreg Shropshire M.D.   On: 02/25/2020 00:03   CT Head Wo Contrast  Result Date: 02/25/2020 CLINICAL DATA:  Worsening dementia and hallucinations EXAM: CT HEAD WITHOUT CONTRAST TECHNIQUE: Contiguous axial images were obtained from the base of the skull through the vertex without intravenous contrast. COMPARISON:  None. FINDINGS: Brain: No evidence of acute infarction, hemorrhage, hydrocephalus, extra-axial collection or mass lesion/mass effect. Mild atrophic changes are noted. Vascular: No hyperdense vessel or unexpected calcification. Skull: Normal. Negative for fracture or focal lesion. Sinuses/Orbits: No acute finding. Other: None. IMPRESSION: Mild atrophic changes without acute abnormality. Electronically Signed   By: Alcide Clever M.D.   On: 02/25/2020 00:59   CT Angio Chest PE W/Cm &/Or Wo Cm  Result Date: 02/13/2020 CLINICAL DATA:  Shortness of breath EXAM: CT ANGIOGRAPHY CHEST WITH CONTRAST TECHNIQUE: Multidetector CT imaging of the chest was performed using the standard protocol during bolus administration of intravenous contrast. Multiplanar CT image  reconstructions and MIPs were obtained to evaluate the vascular anatomy. CONTRAST:  70mL OMNIPAQUE IOHEXOL 350 MG/ML SOLN COMPARISON:  Chest x-ray from earlier in the same day. FINDINGS: Cardiovascular: Thoracic aorta demonstrates atherosclerotic calcification without aneurysmal dilatation. The pulmonary artery shows a normal branching pattern bilaterally. No filling defect to suggest pulmonary embolism is noted. Cardiac enlargement is seen. The coronary arteries demonstrate scattered atherosclerotic change. Mediastinum/Nodes: Thoracic inlet is within normal limits. No sizable hilar or mediastinal adenopathy is noted. The esophagus as visualized is within normal limits. Lungs/Pleura: Lungs demonstrate diffuse emphysematous change with multiple calcified pleural plaques and calcified  granulomas bilaterally. No focal infiltrate or sizable effusion is seen. Upper Abdomen: Visualized upper abdomen shows no acute abnormality. Musculoskeletal: Degenerative changes of the thoracic spine. No acute bony abnormality is noted. Review of the MIP images confirms the above findings. 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 No acute infiltrate is seen. No evidence of pulmonary emboli. Aortic Atherosclerosis (ICD10-I70.0) and Emphysema (ICD10-J43.9). Electronically Signed   By: Alcide Clever M.D.   On: 02/13/2020 17:39   DG CHEST PORT 1 VIEW  Result Date: 03/08/2020 CLINICAL DATA:  Shortness of breath.  COVID-19 positive EXAM: PORTABLE CHEST 1 VIEW COMPARISON:  March 06, 2020 FINDINGS: Multiple calcified granulomas are noted throughout the lungs. There is slight right base atelectasis. Lungs elsewhere are clear. Stable cardiac prominence is noted. The pulmonary vascularity is within normal limits. No adenopathy. There is aortic atherosclerosis. No bone lesions. IMPRESSION: Calcified granulomas throughout lungs are stable. Mild right base  atelectasis. Lungs elsewhere clear. Stable cardiac prominence. Aortic Atherosclerosis (ICD10-I70.0). Electronically Signed   By: Bretta Bang III M.D.   On: 03/08/2020 08:47   DG CHEST PORT 1 VIEW  Result Date: 03/06/2020 CLINICAL DATA:  Shortness of breath.  COVID-19 positive EXAM: PORTABLE CHEST 1 VIEW COMPARISON:  March 05, 2020 FINDINGS: Multiple calcified granulomas are noted throughout the lungs. There is atelectasis in the lung bases. There is an equivocal small right pleural effusion. Heart is enlarged with pulmonary vascularity normal. There is aortic atherosclerosis. No bone lesions. IMPRESSION: Bibasilar atelectasis with equivocal right pleural effusion. No consolidation. Multiple calcified granulomas throughout the lungs. Stable cardiomegaly. Aortic Atherosclerosis (ICD10-I70.0). Electronically Signed   By: Bretta Bang III M.D.   On: 03/06/2020 09:27   DG CHEST PORT 1 VIEW  Result Date: 03/05/2020 CLINICAL DATA:  80 year old female with shortness of breath. Emphysema. Calcified pleural disease. EXAM: PORTABLE CHEST 1 VIEW COMPARISON:  Portable chest 03/04/2020 and earlier. FINDINGS: Portable AP upright view at 0437 hours. Rotated to the right today. Calcified aortic atherosclerosis. Stable cardiomegaly and mediastinal contours. Coarse and calcified bilateral lung opacity is stable. No pneumothorax or pleural effusion identified. No areas of worsening ventilation allowing for patient rotation. No acute osseous abnormality identified. IMPRESSION: 1. Chronic lung disease and cardiomegaly. 2.  No acute cardiopulmonary abnormality identified. Electronically Signed   By: Odessa Fleming M.D.   On: 03/05/2020 06:43   DG CHEST PORT 1 VIEW  Result Date: 03/04/2020 CLINICAL DATA:  80 year old female with shortness of breath. EXAM: PORTABLE CHEST 1 VIEW COMPARISON:  Chest radiograph dated 02/26/2020. FINDINGS: Bilateral calcified pleural plaques. There is background of emphysema. No new  consolidation. There is no pleural effusion pneumothorax. There is moderate cardiomegaly. Atherosclerotic calcification of the aorta. No acute osseous pathology. IMPRESSION: 1. No acute cardiopulmonary process. 2. Emphysema and calcified pleural plaques. Electronically Signed   By: Elgie Collard M.D.   On: 03/04/2020 17:32   DG Chest Port 1 View  Result Date: 02/26/2020 CLINICAL DATA:  Hypoxia, COVID-19 positivity EXAM: PORTABLE CHEST 1 VIEW COMPARISON:  02/24/2020 FINDINGS: Cardiac shadow is stable. Tortuous thoracic aorta with calcification is noted. Patient rotation to the right accentuates the mediastinal markings. Persistent patchy airspace opacities are noted similar to that seen on the prior exam but slightly increased in the right base. Multiple calcified nodular densities are again seen throughout both lungs stable in appearance from the prior exam related to pleural plaques. IMPRESSION: Patchy airspace opacities superimposed over chronic calcified pleural plaques. Electronically  Signed   By: Alcide Clever M.D.   On: 02/26/2020 02:38   DG Chest Port 1 View  Result Date: 02/13/2020 CLINICAL DATA:  Short of breath for 1 week, altered level of consciousness EXAM: PORTABLE CHEST 1 VIEW COMPARISON:  None. FINDINGS: Single frontal view of the chest demonstrates an enlarged cardiac silhouette. There are numerous calcified nodules and calcified pleural plaques seen bilaterally. No acute airspace disease, effusion, or pneumothorax. No acute bony abnormality. IMPRESSION: 1. Numerous calcified nodules and bilateral pleural plaques. Comparison with any prior studies would be useful. Findings could reflect previous granulomatous disease and/or asbestos exposure. 2. No acute airspace disease. Electronically Signed   By: Sharlet Salina M.D.   On: 02/13/2020 15:40   ECHOCARDIOGRAM LIMITED  Result Date: 02/26/2020    ECHOCARDIOGRAM LIMITED REPORT   Patient Name:   CALY PELLUM Date of Exam: 02/26/2020 Medical  Rec #:  [id number]      Height:       65.0 in Accession #:    [id number]     Weight:       105.8 lb Date of Birth:  1940-07-21       BSA:          1.509 m Patient Age:    79 years       BP:           124/84 mmHg Patient Gender: F              HR:           55 bpm. Exam Location:  Inpatient Procedure: Limited Echo, Color Doppler and Cardiac Doppler Indications:    acute diastolic chf  History:        Patient has no prior history of Echocardiogram examinations.                 CHF, COPD and Covid, Signs/Symptoms:dementia; Risk                 Factors:Hypertension.  Sonographer:    Delcie Roch Referring Phys: [id number] COSTIN M GHERGHE IMPRESSIONS  1. Severe concentric LVH with cavity obliteration of mid cavity and apex with systole consistent with hypertrophic cardiomyopathy Turbulent flow through LV/LVOT but no significant obstruction at rest. . Left ventricular ejection fraction, by estimation,  is 70 to 75%. The left ventricle has hyperdynamic function. The left ventricle has no regional wall motion abnormalities. Left ventricular diastolic parameters are consistent with Grade I diastolic dysfunction (impaired relaxation).  2. Right ventricular systolic function is normal. The right ventricular size is normal. There is normal pulmonary artery systolic pressure.  3. A small pericardial effusion is present.  4. The mitral valve is abnormal.  5. The inferior vena cava is normal in size with greater than 50% respiratory variability, suggesting right atrial pressure of 3 mmHg. FINDINGS  Left Ventricle: Severe concentric LVH with cavity obliteration of mid cavity and apex with systole consistent with hypertrophic cardiomyopathy Turbulent flow through LV/LVOT but no significant obstruction at rest. Left ventricular ejection fraction, by estimation, is 70 to 75%. The left ventricle has hyperdynamic function. The left ventricle has no regional wall motion abnormalities. The left ventricular internal cavity size was small.  There is concentric left ventricular hypertrophy. Left ventricular diastolic parameters are consistent with Grade I diastolic dysfunction (impaired relaxation). Right Ventricle: The right ventricular size is normal. Right vetricular wall thickness was not assessed. Right ventricular systolic function is normal. There is normal pulmonary artery systolic pressure. The tricuspid regurgitant velocity  is 2.52 m/s, and with an assumed right atrial pressure of 3 mmHg, the estimated right ventricular systolic pressure is 28.4 mmHg. Left Atrium: Left atrial size was normal in size. Right Atrium: Right atrial size was normal in size. Pericardium: A small pericardial effusion is present. Mitral Valve: The mitral valve is abnormal. There is mild thickening of the mitral valve leaflet(s). Tricuspid Valve: The tricuspid valve is grossly normal. Tricuspid valve regurgitation is mild. Pulmonic Valve: The pulmonic valve was grossly normal. Venous: The inferior vena cava is normal in size with greater than 50% respiratory variability, suggesting right atrial pressure of 3 mmHg. LEFT VENTRICLE PLAX 2D LVIDd:         3.00 cm  Diastology LVIDs:         1.70 cm  LV e' medial:    3.26 cm/s LV PW:         1.50 cm  LV E/e' medial:  13.0 LV IVS:        1.70 cm  LV e' lateral:   4.79 cm/s LVOT diam:     1.90 cm  LV E/e' lateral: 8.9 LV SV:         82 LV SV Index:   54 LVOT Area:     2.84 cm  IVC IVC diam: 1.20 cm LEFT ATRIUM         Index LA diam:    3.80 cm 2.52 cm/m  AORTIC VALVE LVOT Vmax:   170.00 cm/s LVOT Vmean:  102.000 cm/s LVOT VTI:    0.288 m  AORTA Ao Asc diam: 3.10 cm MITRAL VALVE               TRICUSPID VALVE MV Area (PHT): 2.87 cm    TR Peak grad:   25.4 mmHg MV Decel Time: 264 msec    TR Vmax:        252.00 cm/s MV E velocity: 42.40 cm/s MV A velocity: 81.40 cm/s  SHUNTS MV E/A ratio:  0.52        Systemic VTI:  0.29 m                            Systemic Diam: 1.90 cm Dietrich Pates MD Electronically signed by Dietrich Pates MD  Signature Date/Time: 02/26/2020/9:40:19 PM    Final

## 2020-03-13 NOTE — Plan of Care (Signed)
  Problem: Education: Goal: Knowledge of General Education information will improve Description Including pain rating scale, medication(s)/side effects and non-pharmacologic comfort measures Outcome: Progressing   

## 2020-03-14 DIAGNOSIS — J9601 Acute respiratory failure with hypoxia: Secondary | ICD-10-CM | POA: Diagnosis not present

## 2020-03-14 DIAGNOSIS — F0281 Dementia in other diseases classified elsewhere with behavioral disturbance: Secondary | ICD-10-CM | POA: Diagnosis not present

## 2020-03-14 LAB — CBC
HCT: 35.3 % — ABNORMAL LOW (ref 36.0–46.0)
Hemoglobin: 11.6 g/dL — ABNORMAL LOW (ref 12.0–15.0)
MCH: 30.2 pg (ref 26.0–34.0)
MCHC: 32.9 g/dL (ref 30.0–36.0)
MCV: 91.9 fL (ref 80.0–100.0)
Platelets: 211 10*3/uL (ref 150–400)
RBC: 3.84 MIL/uL — ABNORMAL LOW (ref 3.87–5.11)
RDW: 17.5 % — ABNORMAL HIGH (ref 11.5–15.5)
WBC: 6.6 10*3/uL (ref 4.0–10.5)
nRBC: 0 % (ref 0.0–0.2)

## 2020-03-14 LAB — BASIC METABOLIC PANEL
Anion gap: 9 (ref 5–15)
BUN: 36 mg/dL — ABNORMAL HIGH (ref 8–23)
CO2: 34 mmol/L — ABNORMAL HIGH (ref 22–32)
Calcium: 8.8 mg/dL — ABNORMAL LOW (ref 8.9–10.3)
Chloride: 95 mmol/L — ABNORMAL LOW (ref 98–111)
Creatinine, Ser: 1.15 mg/dL — ABNORMAL HIGH (ref 0.44–1.00)
GFR, Estimated: 48 mL/min — ABNORMAL LOW (ref >60)
Glucose, Bld: 87 mg/dL (ref 70–99)
Potassium: 4.2 mmol/L (ref 3.5–5.1)
Sodium: 138 mmol/L (ref 135–145)

## 2020-03-14 LAB — MAGNESIUM: Magnesium: 2 mg/dL (ref 1.7–2.4)

## 2020-03-14 MED ORDER — METOPROLOL TARTRATE 50 MG PO TABS
50.0000 mg | ORAL_TABLET | Freq: Two times a day (BID) | ORAL | Status: DC
  Administered 2020-03-14 – 2020-03-21 (×13): 50 mg via ORAL
  Filled 2020-03-14 (×14): qty 1

## 2020-03-14 MED ORDER — LORAZEPAM 2 MG/ML IJ SOLN
1.0000 mg | Freq: Once | INTRAMUSCULAR | Status: AC
  Administered 2020-03-14: 1 mg via INTRAVENOUS
  Filled 2020-03-14: qty 1

## 2020-03-14 MED ORDER — FUROSEMIDE 40 MG PO TABS
40.0000 mg | ORAL_TABLET | Freq: Once | ORAL | Status: AC
  Administered 2020-03-14: 40 mg via ORAL
  Filled 2020-03-14: qty 1

## 2020-03-14 MED ORDER — HALOPERIDOL LACTATE 5 MG/ML IJ SOLN
4.0000 mg | Freq: Once | INTRAMUSCULAR | Status: AC
  Administered 2020-03-14: 4 mg via INTRAVENOUS
  Filled 2020-03-14: qty 1

## 2020-03-14 NOTE — Progress Notes (Signed)
PROGRESS NOTE                                                                                                                                                                                                             Patient Demographics:    Patricia Terrell, is a 80 y.o. female, DOB - 10/29/1940, ZOX:[id number]  Outpatient Primary MD for the patient is Pcp, No    LOS - 17  Admit date - 02/24/2020    Chief Complaint  Patient presents with  . Psychiatric Evaluation  . Altered Mental Status       Brief Narrative (HPI from H&P)   80 year old African-American female with past medical history significant for but not limited to dementia, COPD/emphysema, hypertension as well as other comorbidities was admitted from 02/13/20-02/16/20 for COPD exacerbation and was found to be Covid positive at that time, she was treated with 3 days of remdesivir and discharged home, she came back to the hospital on 02/26/2020 with shortness of breath.  This time she was diagnosed with worsening COVID-19 pneumonia along with acute on chronic diastolic CHF and admitted to the hospital.  She developed worsening encephalopathy and became aggressive, psych was consulted and currently she has been IVC to be placed to inpatient psych facility. She was transferred to my care on 03/10/2020 on day 13 of her hospital stay.  Of note patient has moved from [place] with her daughter 1 month ago, according to the daughter patient was having some memory issues for a while at [place] she is unaware of having any behavioral issues there.   Subjective:   Patient in bed, appears comfortable, denies any headache, no fever, no chest pain or pressure, no shortness of breath , no abdominal pain. No focal weakness.   Assessment  & Plan :   1. Acute Hypoxic Resp. Failure due to Acute Covid 19 Viral Pneumonitis lung with acute on chronic diastolic CHF EF 75% on recent  echocardiogram -she has finished her COVID specific treatment, steroids stopped on 03/10/2020, she has been adequately diuresed with as needed Lasix.  Currently on supportive care with breathing treatments as needed, symptom-free on room air, has been seen by cardiology this admission.  Encouraged the patient to sit up in chair in the daytime use I-S and flutter valve for pulmonary toiletry and then prone in  bed when at night.  Will advance activity and titrate down oxygen as possible.  Recent Labs  Lab 03/08/20 0356 03/09/20 0415 03/10/20 0123 03/14/20 0331  WBC 8.9 7.3 8.9 6.6  HGB 12.4 11.9* 12.6 11.6*  HCT 40.6 36.9 39.4 35.3*  PLT 229 201 183 211  AST 49* 39 44*  --   ALT 47* 43 40  --   ALKPHOS 39 32* 38  --   BILITOT 0.9 0.7 0.9  --   ALBUMIN 2.4* 2.3* 2.4*  --     Her initial Covid positive date is 02/13/2020, she is clearly out of her quarantine.    2.  Severe LVH consistent with hypertrophic cardiomyopathy.  Kindly review echo, seen by cardiology, recommend beta-blocker, placed on moderate dose Lopressor will monitor.  Avoid Imdur or Norvasc. Edema - Lasix PRN as allowed by her BP with TEDs.  3.  Hyperkalemia.  Treated on 03/10/2020.  Stopped steroids.  4.  CKD 3A.  Creatinine at or better than baseline.  5.  Smoking.  Counseled to quit, currently on NicoDerm patch.  6.  Hypertension.  Pressure slightly soft hydralazine stopped, Coreg stopped, low-dose Lopressor if blood pressure allows, hold further Lasix.  7.  Dementia with metabolic encephalopathy.  Seen by psych this admission, currently on Seroquel along with as needed Haldol. IVC in place, awaits inpatient psych placement.   8.  Anemia of chronic disease.  Monitor.  9.  Moderate protein calorie malnutrition.  On protein supplements.  10. GERD.  On Pepcid.       Condition - Extremely Guarded  Family Communication  : [name] [phone] on 03/10/2020, 03/13/20  Code Status :  DNR  Consults  :   Psych, cardiology  PUD Prophylaxis :     Procedures  :     TTE -  1. Severe concentric LVH with cavity obliteration of mid cavity and apex with systole consistent with hypertrophic cardiomyopathy Turbulent flow through LV/LVOT but no significant obstruction at rest. . Left ventricular ejection fraction, by estimation,  is 70 to 75%. The left ventricle has hyperdynamic function. The left ventricle has no regional wall motion abnormalities. Left ventricular diastolic parameters are consistent with Grade I diastolic dysfunction (impaired relaxation).  2. Right ventricular systolic function is normal. The right ventricular size is normal. There is normal pulmonary artery systolic pressure.  3. A small pericardial effusion is present.  4. The mitral valve is abnormal.  5. The inferior vena cava is normal in size with greater than 50% respiratory variability, suggesting right atrial pressure of 3 mmHg  CT head - Non acute      Disposition Plan  :    Status is: Inpatient  Remains inpatient appropriate because:Unsafe d/c plan   Dispo: The patient is from: Home              Anticipated d/c is to: [facility]              Anticipated d/c date is: 1 day              Patient currently is medically stable to d/c.   Difficult to place patient Yes  DVT Prophylaxis  :  Lovenox    Lab Results  Component Value Date   PLT 211 03/14/2020    Diet :  Diet Order            Diet Heart Room service appropriate? Yes; Fluid consistency: Thin  Diet effective now  Inpatient Medications  Scheduled Meds: . (feeding supplement) PROSource Plus  30 mL Oral BID BM  . enoxaparin (LOVENOX) injection  30 mg Subcutaneous Q24H  . famotidine  20 mg Oral Daily  . feeding supplement  237 mL Oral TID BM  . metoprolol tartrate  50 mg Oral BID  . mometasone-formoterol  2 puff Inhalation BID  . multivitamin with minerals  1 tablet Oral Daily  . nicotine  21 mg Transdermal Daily  . polyethylene glycol   17 g Oral BID  . QUEtiapine  25 mg Oral QHS  . senna-docusate  1 tablet Oral BID   Continuous Infusions:  PRN Meds:.acetaminophen, albuterol, alum & mag hydroxide-simeth, haloperidol lactate, [DISCONTINUED] ondansetron **OR** ondansetron (ZOFRAN) IV  Antibiotics  :    Anti-infectives (From admission, onward)   None       Time Spent in minutes  30   Susa Raring M.D on 03/14/2020 at 11:12 AM  To page go to [email or web address]   [facility] -  Office  [phone]    See all Orders from today for further details    Objective:   Vitals:   03/13/20 1259 03/13/20 2002 03/14/20 0552 03/14/20 0804  BP: 135/65 133/83 (!) 154/94 (!) 150/80  Pulse:  73 64 77  Resp:  17 17 20   Temp:  98.3 F (36.8 C) 98.2 F (36.8 C) 98.5 F (36.9 C)  TempSrc:    Oral  SpO2:  95% 96% 97%  Weight:      Height:        Wt Readings from Last 3 Encounters:  02/26/20 48 kg  02/16/20 48 kg     Intake/Output Summary (Last 24 hours) at 03/14/2020 1112 Last data filed at 03/14/2020 0939 Gross per 24 hour  Intake 375.54 ml  Output 1000 ml  Net -624.46 ml     Physical Exam  Awake mildly confused, No new F.N deficits, Normal affect [place].AT,PERRAL Supple Neck,No JVD, No cervical lymphadenopathy appriciated.  Symmetrical Chest wall movement, Good air movement bilaterally, CTAB RRR,No Gallops, Rubs or new Murmurs, No Parasternal Heave +ve B.Sounds, Abd Soft, No tenderness, No organomegaly appriciated, No rebound - guarding or rigidity. + leg edema - TEDs     Data Review:    CBC Recent Labs  Lab 03/08/20 0356 03/09/20 0415 03/10/20 0123 03/14/20 0331  WBC 8.9 7.3 8.9 6.6  HGB 12.4 11.9* 12.6 11.6*  HCT 40.6 36.9 39.4 35.3*  PLT 229 201 183 211  MCV 93.1 92.0 92.3 91.9  MCH 28.4 29.7 29.5 30.2  MCHC 30.5 32.2 32.0 32.9  RDW 16.6* 16.8* 16.9* 17.5*  LYMPHSABS 1.0 0.7 0.6*  --   MONOABS 0.5 0.4 0.4  --   EOSABS 0.0 0.0 0.0  --   BASOSABS 0.0 0.0 0.0  --     Recent Labs   Lab 03/08/20 0356 03/09/20 0415 03/10/20 0123 03/11/20 0213 03/12/20 0439 03/13/20 0151 03/14/20 0331  NA 138 139 137 140 141 140 138  K 4.7 4.7 5.6* 3.3* 4.2 4.1 4.2  CL 89* 95* 97* 95* 95* 93* 95*  CO2 41* 37* 26 36* 37* 35* 34*  GLUCOSE 89 89 87 94 91 101* 87  BUN 64* 55* 52* 30* 32* 38* 36*  CREATININE 1.66* 1.32* 0.96 0.92 1.14* 1.24* 1.15*  CALCIUM 9.1 8.7* 8.7* 8.8* 9.2 8.8* 8.8*  AST 49* 39 44*  --   --   --   --   ALT 47* 43 40  --   --   --   --  ALKPHOS 39 32* 38  --   --   --   --   BILITOT 0.9 0.7 0.9  --   --   --   --   ALBUMIN 2.4* 2.3* 2.4*  --   --   --   --   MG 2.0 2.1 2.2  --   --  2.0 2.0    ------------------------------------------------------------------------------------------------------------------ No results for input(s): CHOL, HDL, LDLCALC, TRIG, CHOLHDL, LDLDIRECT in the last 72 hours.  No results found for: HGBA1C ------------------------------------------------------------------------------------------------------------------ No results for input(s): TSH, T4TOTAL, T3FREE, THYROIDAB in the last 72 hours.  Invalid input(s): FREET3  Cardiac Enzymes No results for input(s): CKMB, TROPONINI, MYOGLOBIN in the last 168 hours.  Invalid input(s): CK ------------------------------------------------------------------------------------------------------------------    Component Value Date/Time   BNP 918.7 (H) 03/06/2020 0249    Micro Results No results found for this or any previous visit (from the past 240 hour(s)).  Radiology Reports DG Chest 2 View  Result Date: 02/25/2020 CLINICAL DATA:  Shortness of breath EXAM: CHEST - 2 VIEW COMPARISON:  CT 02/13/2020, radiograph 02/12/2020 FINDINGS: Extensive reticulonodular opacities throughout the lungs, much of which is attributable to the extensive calcified pleural plaque seen on comparison CT and admixed regions of granulomata. Additionally, some coarsened interstitial and bronchitic features are  compatible with emphysema better demonstrated on the cross-sectional imaging. There are however some increasing areas of more hazy and patchy opacity in both lungs, particularly towards the lung bases which could reflect developing infection or edema. No pneumothorax. No visible pleural effusion. Stable cardiomediastinal contours with a calcified, tortuous aorta. No acute osseous or soft tissue abnormality. IMPRESSION: 1. Increasing areas of hazy and patchy opacity in both lungs, particularly towards the lung bases which could reflect developing infection or edema. 2. Redemonstrated reticulonodular opacities compatible with a combination of calcified pleural plaque and calcified granulomata seen on comparison study as well as features chronic bronchitis and emphysema also present prior cross-sectional imaging. Electronically Signed   By: Kreg Shropshire M.D.   On: 02/25/2020 00:03   CT Head Wo Contrast  Result Date: 02/25/2020 CLINICAL DATA:  Worsening dementia and hallucinations EXAM: CT HEAD WITHOUT CONTRAST TECHNIQUE: Contiguous axial images were obtained from the base of the skull through the vertex without intravenous contrast. COMPARISON:  None. FINDINGS: Brain: No evidence of acute infarction, hemorrhage, hydrocephalus, extra-axial collection or mass lesion/mass effect. Mild atrophic changes are noted. Vascular: No hyperdense vessel or unexpected calcification. Skull: Normal. Negative for fracture or focal lesion. Sinuses/Orbits: No acute finding. Other: None. IMPRESSION: Mild atrophic changes without acute abnormality. Electronically Signed   By: Alcide Clever M.D.   On: 02/25/2020 00:59   CT Angio Chest PE W/Cm &/Or Wo Cm  Result Date: 02/13/2020 CLINICAL DATA:  Shortness of breath EXAM: CT ANGIOGRAPHY CHEST WITH CONTRAST TECHNIQUE: Multidetector CT imaging of the chest was performed using the standard protocol during bolus administration of intravenous contrast. Multiplanar CT image reconstructions and  MIPs were obtained to evaluate the vascular anatomy. CONTRAST:  13mL OMNIPAQUE IOHEXOL 350 MG/ML SOLN COMPARISON:  Chest x-ray from earlier in the same day. FINDINGS: Cardiovascular: Thoracic aorta demonstrates atherosclerotic calcification without aneurysmal dilatation. The pulmonary artery shows a normal branching pattern bilaterally. No filling defect to suggest pulmonary embolism is noted. Cardiac enlargement is seen. The coronary arteries demonstrate scattered atherosclerotic change. Mediastinum/Nodes: Thoracic inlet is within normal limits. No sizable hilar or mediastinal adenopathy is noted. The esophagus as visualized is within normal limits. Lungs/Pleura: Lungs demonstrate diffuse emphysematous change with  multiple calcified pleural plaques and calcified granulomas bilaterally. No focal infiltrate or sizable effusion is seen. Upper Abdomen: Visualized upper abdomen shows no acute abnormality. Musculoskeletal: Degenerative changes of the thoracic spine. No acute bony abnormality is noted. Review of the MIP images confirms the above findings. 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 No acute infiltrate is seen. No evidence of pulmonary emboli. Aortic Atherosclerosis (ICD10-I70.0) and Emphysema (ICD10-J43.9). Electronically Signed   By: Alcide Clever M.D.   On: 02/13/2020 17:39   DG CHEST PORT 1 VIEW  Result Date: 03/08/2020 CLINICAL DATA:  Shortness of breath.  COVID-19 positive EXAM: PORTABLE CHEST 1 VIEW COMPARISON:  March 06, 2020 FINDINGS: Multiple calcified granulomas are noted throughout the lungs. There is slight right base atelectasis. Lungs elsewhere are clear. Stable cardiac prominence is noted. The pulmonary vascularity is within normal limits. No adenopathy. There is aortic atherosclerosis. No bone lesions. IMPRESSION: Calcified granulomas throughout lungs are stable. Mild right base atelectasis. Lungs  elsewhere clear. Stable cardiac prominence. Aortic Atherosclerosis (ICD10-I70.0). Electronically Signed   By: Bretta Bang III M.D.   On: 03/08/2020 08:47   DG CHEST PORT 1 VIEW  Result Date: 03/06/2020 CLINICAL DATA:  Shortness of breath.  COVID-19 positive EXAM: PORTABLE CHEST 1 VIEW COMPARISON:  March 05, 2020 FINDINGS: Multiple calcified granulomas are noted throughout the lungs. There is atelectasis in the lung bases. There is an equivocal small right pleural effusion. Heart is enlarged with pulmonary vascularity normal. There is aortic atherosclerosis. No bone lesions. IMPRESSION: Bibasilar atelectasis with equivocal right pleural effusion. No consolidation. Multiple calcified granulomas throughout the lungs. Stable cardiomegaly. Aortic Atherosclerosis (ICD10-I70.0). Electronically Signed   By: Bretta Bang III M.D.   On: 03/06/2020 09:27   DG CHEST PORT 1 VIEW  Result Date: 03/05/2020 CLINICAL DATA:  80 year old female with shortness of breath. Emphysema. Calcified pleural disease. EXAM: PORTABLE CHEST 1 VIEW COMPARISON:  Portable chest 03/04/2020 and earlier. FINDINGS: Portable AP upright view at 0437 hours. Rotated to the right today. Calcified aortic atherosclerosis. Stable cardiomegaly and mediastinal contours. Coarse and calcified bilateral lung opacity is stable. No pneumothorax or pleural effusion identified. No areas of worsening ventilation allowing for patient rotation. No acute osseous abnormality identified. IMPRESSION: 1. Chronic lung disease and cardiomegaly. 2.  No acute cardiopulmonary abnormality identified. Electronically Signed   By: Odessa Fleming M.D.   On: 03/05/2020 06:43   DG CHEST PORT 1 VIEW  Result Date: 03/04/2020 CLINICAL DATA:  80 year old female with shortness of breath. EXAM: PORTABLE CHEST 1 VIEW COMPARISON:  Chest radiograph dated 02/26/2020. FINDINGS: Bilateral calcified pleural plaques. There is background of emphysema. No new consolidation. There is no  pleural effusion pneumothorax. There is moderate cardiomegaly. Atherosclerotic calcification of the aorta. No acute osseous pathology. IMPRESSION: 1. No acute cardiopulmonary process. 2. Emphysema and calcified pleural plaques. Electronically Signed   By: Elgie Collard M.D.   On: 03/04/2020 17:32   DG Chest Port 1 View  Result Date: 02/26/2020 CLINICAL DATA:  Hypoxia, COVID-19 positivity EXAM: PORTABLE CHEST 1 VIEW COMPARISON:  02/24/2020 FINDINGS: Cardiac shadow is stable. Tortuous thoracic aorta with calcification is noted. Patient rotation to the right accentuates the mediastinal markings. Persistent patchy airspace opacities are noted similar to that seen on the prior exam but slightly increased in the right base. Multiple calcified nodular densities are again seen throughout both lungs stable in appearance from the prior exam related to pleural plaques. IMPRESSION: Patchy airspace opacities superimposed  over chronic calcified pleural plaques. Electronically Signed   By: Alcide Clever M.D.   On: 02/26/2020 02:38   DG Chest Port 1 View  Result Date: 02/13/2020 CLINICAL DATA:  Short of breath for 1 week, altered level of consciousness EXAM: PORTABLE CHEST 1 VIEW COMPARISON:  None. FINDINGS: Single frontal view of the chest demonstrates an enlarged cardiac silhouette. There are numerous calcified nodules and calcified pleural plaques seen bilaterally. No acute airspace disease, effusion, or pneumothorax. No acute bony abnormality. IMPRESSION: 1. Numerous calcified nodules and bilateral pleural plaques. Comparison with any prior studies would be useful. Findings could reflect previous granulomatous disease and/or asbestos exposure. 2. No acute airspace disease. Electronically Signed   By: Sharlet Salina M.D.   On: 02/13/2020 15:40   ECHOCARDIOGRAM LIMITED  Result Date: 02/26/2020    ECHOCARDIOGRAM LIMITED REPORT   Patient Name:   NIKKOL PAI Date of Exam: 02/26/2020 Medical Rec #:  [id number]       Height:       65.0 in Accession #:    [id number]     Weight:       105.8 lb Date of Birth:  1940/05/06       BSA:          1.509 m Patient Age:    79 years       BP:           124/84 mmHg Patient Gender: F              HR:           55 bpm. Exam Location:  Inpatient Procedure: Limited Echo, Color Doppler and Cardiac Doppler Indications:    acute diastolic chf  History:        Patient has no prior history of Echocardiogram examinations.                 CHF, COPD and Covid, Signs/Symptoms:dementia; Risk                 Factors:Hypertension.  Sonographer:    Delcie Roch Referring Phys: [id number] COSTIN M GHERGHE IMPRESSIONS  1. Severe concentric LVH with cavity obliteration of mid cavity and apex with systole consistent with hypertrophic cardiomyopathy Turbulent flow through LV/LVOT but no significant obstruction at rest. . Left ventricular ejection fraction, by estimation,  is 70 to 75%. The left ventricle has hyperdynamic function. The left ventricle has no regional wall motion abnormalities. Left ventricular diastolic parameters are consistent with Grade I diastolic dysfunction (impaired relaxation).  2. Right ventricular systolic function is normal. The right ventricular size is normal. There is normal pulmonary artery systolic pressure.  3. A small pericardial effusion is present.  4. The mitral valve is abnormal.  5. The inferior vena cava is normal in size with greater than 50% respiratory variability, suggesting right atrial pressure of 3 mmHg. FINDINGS  Left Ventricle: Severe concentric LVH with cavity obliteration of mid cavity and apex with systole consistent with hypertrophic cardiomyopathy Turbulent flow through LV/LVOT but no significant obstruction at rest. Left ventricular ejection fraction, by estimation, is 70 to 75%. The left ventricle has hyperdynamic function. The left ventricle has no regional wall motion abnormalities. The left ventricular internal cavity size was small. There is concentric left  ventricular hypertrophy. Left ventricular diastolic parameters are consistent with Grade I diastolic dysfunction (impaired relaxation). Right Ventricle: The right ventricular size is normal. Right vetricular wall thickness was not assessed. Right ventricular systolic function is normal. There is normal pulmonary artery  systolic pressure. The tricuspid regurgitant velocity is 2.52 m/s, and with an assumed right atrial pressure of 3 mmHg, the estimated right ventricular systolic pressure is 28.4 mmHg. Left Atrium: Left atrial size was normal in size. Right Atrium: Right atrial size was normal in size. Pericardium: A small pericardial effusion is present. Mitral Valve: The mitral valve is abnormal. There is mild thickening of the mitral valve leaflet(s). Tricuspid Valve: The tricuspid valve is grossly normal. Tricuspid valve regurgitation is mild. Pulmonic Valve: The pulmonic valve was grossly normal. Venous: The inferior vena cava is normal in size with greater than 50% respiratory variability, suggesting right atrial pressure of 3 mmHg. LEFT VENTRICLE PLAX 2D LVIDd:         3.00 cm  Diastology LVIDs:         1.70 cm  LV e' medial:    3.26 cm/s LV PW:         1.50 cm  LV E/e' medial:  13.0 LV IVS:        1.70 cm  LV e' lateral:   4.79 cm/s LVOT diam:     1.90 cm  LV E/e' lateral: 8.9 LV SV:         82 LV SV Index:   54 LVOT Area:     2.84 cm  IVC IVC diam: 1.20 cm LEFT ATRIUM         Index LA diam:    3.80 cm 2.52 cm/m  AORTIC VALVE LVOT Vmax:   170.00 cm/s LVOT Vmean:  102.000 cm/s LVOT VTI:    0.288 m  AORTA Ao Asc diam: 3.10 cm MITRAL VALVE               TRICUSPID VALVE MV Area (PHT): 2.87 cm    TR Peak grad:   25.4 mmHg MV Decel Time: 264 msec    TR Vmax:        252.00 cm/s MV E velocity: 42.40 cm/s MV A velocity: 81.40 cm/s  SHUNTS MV E/A ratio:  0.52        Systemic VTI:  0.29 m                            Systemic Diam: 1.90 cm Dietrich Pates MD Electronically signed by Dietrich Pates MD Signature Date/Time:  02/26/2020/9:40:19 PM    Final

## 2020-03-14 NOTE — Plan of Care (Signed)
°  Problem: Clinical Measurements: °Goal: Ability to maintain clinical measurements within normal limits will improve °Outcome: Progressing °Goal: Will remain free from infection °Outcome: Progressing °Goal: Diagnostic test results will improve °Outcome: Progressing °Goal: Respiratory complications will improve °Outcome: Progressing °Goal: Cardiovascular complication will be avoided °Outcome: Progressing °  °Problem: Activity: °Goal: Risk for activity intolerance will decrease °Outcome: Progressing °  °Problem: Nutrition: °Goal: Adequate nutrition will be maintained °Outcome: Progressing °  °Problem: Coping: °Goal: Level of anxiety will decrease °Outcome: Progressing °  °Problem: Elimination: °Goal: Will not experience complications related to bowel motility °Outcome: Progressing °Goal: Will not experience complications related to urinary retention °Outcome: Progressing °  °Problem: Pain Managment: °Goal: General experience of comfort will improve °Outcome: Progressing °  °Problem: Safety: °Goal: Ability to remain free from injury will improve °Outcome: Progressing °  °Problem: Skin Integrity: °Goal: Risk for impaired skin integrity will decrease °Outcome: Progressing °  °Problem: Education: °Goal: Knowledge of risk factors and measures for prevention of condition will improve °Outcome: Progressing °  °Problem: Coping: °Goal: Psychosocial and spiritual needs will be supported °Outcome: Progressing °  °Problem: Respiratory: °Goal: Will maintain a patent airway °Outcome: Progressing °Goal: Complications related to the disease process, condition or treatment will be avoided or minimized °Outcome: Progressing °  °

## 2020-03-14 NOTE — Plan of Care (Signed)
  Problem: Education: Goal: Knowledge of General Education information will improve Description: Including pain rating scale, medication(s)/side effects and non-pharmacologic comfort measures Outcome: Progressing   Problem: Health Behavior/Discharge Planning: Goal: Ability to manage health-related needs will improve Outcome: Progressing   Problem: Clinical Measurements: Goal: Ability to maintain clinical measurements within normal limits will improve Outcome: Progressing Goal: Will remain free from infection Outcome: Progressing Goal: Diagnostic test results will improve Outcome: Progressing Goal: Respiratory complications will improve Outcome: Progressing Goal: Cardiovascular complication will be avoided Outcome: Progressing   Problem: Nutrition: Goal: Adequate nutrition will be maintained Outcome: Progressing   Problem: Coping: Goal: Level of anxiety will decrease Outcome: Progressing   Problem: Elimination: Goal: Will not experience complications related to bowel motility Outcome: Progressing Goal: Will not experience complications related to urinary retention Outcome: Progressing   Problem: Pain Managment: Goal: General experience of comfort will improve Outcome: Progressing   Problem: Safety: Goal: Ability to remain free from injury will improve Outcome: Progressing   Problem: Skin Integrity: Goal: Risk for impaired skin integrity will decrease Outcome: Progressing   Problem: Education: Goal: Knowledge of risk factors and measures for prevention of condition will improve Outcome: Progressing   Problem: Coping: Goal: Psychosocial and spiritual needs will be supported Outcome: Progressing   Problem: Respiratory: Goal: Will maintain a patent airway Outcome: Progressing Goal: Complications related to the disease process, condition or treatment will be avoided or minimized Outcome: Progressing   

## 2020-03-14 NOTE — Plan of Care (Signed)
  Problem: Education: Goal: Knowledge of General Education information will improve Description: Including pain rating scale, medication(s)/side effects and non-pharmacologic comfort measures 03/14/2020 0640 by Charma Igo, LPN Outcome: Progressing 03/14/2020 0634 by Charma Igo, LPN Outcome: Not Progressing   Problem: Clinical Measurements: Goal: Ability to maintain clinical measurements within normal limits will improve 03/14/2020 0640 by Charma Igo, LPN Outcome: Progressing 03/14/2020 0634 by Charma Igo, LPN Outcome: Progressing Goal: Will remain free from infection 03/14/2020 0640 by Charma Igo, LPN Outcome: Progressing 03/14/2020 0634 by Charma Igo, LPN Outcome: Progressing Goal: Diagnostic test results will improve 03/14/2020 0640 by Charma Igo, LPN Outcome: Progressing 03/14/2020 0634 by Charma Igo, LPN Outcome: Progressing Goal: Respiratory complications will improve 03/14/2020 0640 by Charma Igo, LPN Outcome: Progressing 03/14/2020 0634 by Charma Igo, LPN Outcome: Progressing Goal: Cardiovascular complication will be avoided 03/14/2020 0640 by Charma Igo, LPN Outcome: Progressing 03/14/2020 0634 by Charma Igo, LPN Outcome: Progressing   Problem: Activity: Goal: Risk for activity intolerance will decrease 03/14/2020 0640 by Charma Igo, LPN Outcome: Progressing 03/14/2020 0634 by Charma Igo, LPN Outcome: Progressing   Problem: Nutrition: Goal: Adequate nutrition will be maintained 03/14/2020 0640 by Charma Igo, LPN Outcome: Progressing 03/14/2020 0634 by Charma Igo, LPN Outcome: Progressing   Problem: Coping: Goal: Level of anxiety will decrease 03/14/2020 0640 by Charma Igo, LPN Outcome: Progressing 03/14/2020 0634 by Charma Igo, LPN Outcome: Progressing   Problem: Elimination: Goal: Will not experience complications related to bowel motility 03/14/2020 0640 by  Charma Igo, LPN Outcome: Progressing 03/14/2020 0634 by Charma Igo, LPN Outcome: Progressing Goal: Will not experience complications related to urinary retention 03/14/2020 0640 by Charma Igo, LPN Outcome: Progressing 03/14/2020 0634 by Charma Igo, LPN Outcome: Progressing   Problem: Pain Managment: Goal: General experience of comfort will improve 03/14/2020 0640 by Charma Igo, LPN Outcome: Progressing 03/14/2020 0634 by Charma Igo, LPN Outcome: Progressing   Problem: Safety: Goal: Ability to remain free from injury will improve 03/14/2020 0640 by Charma Igo, LPN Outcome: Progressing 03/14/2020 0634 by Charma Igo, LPN Outcome: Progressing   Problem: Skin Integrity: Goal: Risk for impaired skin integrity will decrease 03/14/2020 0640 by Charma Igo, LPN Outcome: Progressing 03/14/2020 0634 by Charma Igo, LPN Outcome: Progressing   Problem: Coping: Goal: Psychosocial and spiritual needs will be supported 03/14/2020 0640 by Charma Igo, LPN Outcome: Progressing 03/14/2020 0634 by Charma Igo, LPN Outcome: Progressing   Problem: Respiratory: Goal: Will maintain a patent airway 03/14/2020 0640 by Charma Igo, LPN Outcome: Progressing 03/14/2020 0634 by Charma Igo, LPN Outcome: Progressing Goal: Complications related to the disease process, condition or treatment will be avoided or minimized 03/14/2020 0640 by Charma Igo, LPN Outcome: Progressing 03/14/2020 0634 by Charma Igo, LPN Outcome: Progressing

## 2020-03-15 DIAGNOSIS — F0281 Dementia in other diseases classified elsewhere with behavioral disturbance: Secondary | ICD-10-CM | POA: Diagnosis not present

## 2020-03-15 DIAGNOSIS — J9601 Acute respiratory failure with hypoxia: Secondary | ICD-10-CM | POA: Diagnosis not present

## 2020-03-15 LAB — BASIC METABOLIC PANEL
Anion gap: 7 (ref 5–15)
BUN: 27 mg/dL — ABNORMAL HIGH (ref 8–23)
CO2: 37 mmol/L — ABNORMAL HIGH (ref 22–32)
Calcium: 8.5 mg/dL — ABNORMAL LOW (ref 8.9–10.3)
Chloride: 94 mmol/L — ABNORMAL LOW (ref 98–111)
Creatinine, Ser: 1.24 mg/dL — ABNORMAL HIGH (ref 0.44–1.00)
GFR, Estimated: 44 mL/min — ABNORMAL LOW (ref >60)
Glucose, Bld: 92 mg/dL (ref 70–99)
Potassium: 4 mmol/L (ref 3.5–5.1)
Sodium: 138 mmol/L (ref 135–145)

## 2020-03-15 LAB — MAGNESIUM: Magnesium: 2 mg/dL (ref 1.7–2.4)

## 2020-03-15 MED ORDER — HALOPERIDOL LACTATE 5 MG/ML IJ SOLN
2.0000 mg | Freq: Four times a day (QID) | INTRAMUSCULAR | Status: DC | PRN
  Administered 2020-03-15 – 2020-03-31 (×14): 2 mg via INTRAVENOUS
  Filled 2020-03-15 (×14): qty 1

## 2020-03-15 MED ORDER — QUETIAPINE FUMARATE 25 MG PO TABS
25.0000 mg | ORAL_TABLET | Freq: Two times a day (BID) | ORAL | Status: DC
  Administered 2020-03-15 – 2020-04-01 (×34): 25 mg via ORAL
  Filled 2020-03-15 (×34): qty 1

## 2020-03-15 NOTE — TOC Progression Note (Signed)
Transition of Care [facility]) - Progression Note    Patient Details  Name: [name] MRN: [id number] Date of Birth: 11-Nov-1940  Transition of [facility] [place]) CM/SW Contact  Mearl Latin, LCSW Phone Number: 03/15/2020, 10:27 AM  Clinical Narrative:    CSW received call from [facility] confirming patient is still on their waitlist.    Expected Discharge Plan: Psychiatric Hospital Barriers to Discharge: Psych Bed not available  Expected Discharge Plan and Services Expected Discharge Plan: Psychiatric Hospital In-house Referral: Clinical Social Work   Post Acute Care Choice: Home Health Living arrangements for the past 2 months: Apartment                                       Social Determinants of Health (SDOH) Interventions    Readmission Risk Interventions No flowsheet data found.

## 2020-03-15 NOTE — Progress Notes (Signed)
PROGRESS NOTE                                                                                                                                                                                                             Patient Demographics:    Patricia Terrell, is a 80 y.o. female, DOB - 28-Feb-1940, HRC:[id number]  Outpatient Primary MD for the patient is Pcp, No    LOS - 18  Admit date - 02/24/2020    Chief Complaint  Patient presents with  . Psychiatric Evaluation  . Altered Mental Status       Brief Narrative (HPI from H&P)   80 year old African-American female with past medical history significant for but not limited to dementia, COPD/emphysema, hypertension as well as other comorbidities was admitted from 02/13/20-02/16/20 for COPD exacerbation and was found to be Covid positive at that time, she was treated with 3 days of remdesivir and discharged home, she came back to the hospital on 02/26/2020 with shortness of breath.  This time she was diagnosed with worsening COVID-19 pneumonia along with acute on chronic diastolic CHF and admitted to the hospital.  She developed worsening encephalopathy and became aggressive, psych was consulted and currently she has been IVC to be placed to inpatient psych facility. She was transferred to my care on 03/10/2020 on day 13 of her hospital stay.  Of note patient has moved from [place] with her daughter 1 month ago, according to the daughter patient was having some memory issues for a while at [place] she is unaware of having any behavioral issues there.   Subjective:   In bed sleeping as she could not catch any sleep last night, denies any headache chest or abdominal pain, states she is comfortable.   Assessment  & Plan :   1. Acute Hypoxic Resp. Failure due to Acute Covid 19 Viral Pneumonitis lung with acute on chronic diastolic CHF EF 75% on recent echocardiogram -she has finished her  COVID specific treatment, steroids stopped on 03/10/2020, she has been adequately diuresed with as needed Lasix.  Currently on supportive care with breathing treatments as needed, symptom-free on room air, has been seen by cardiology this admission.  Encouraged the patient to sit up in chair in the daytime use I-S and flutter valve for pulmonary toiletry and then prone in bed when at  night.  Will advance activity and titrate down oxygen as possible.  Her initial Covid positive date is 02/13/2020, she is clearly out of her quarantine.   2.  Severe LVH consistent with hypertrophic cardiomyopathy.  Kindly review echo, seen by cardiology, recommend beta-blocker, placed on moderate dose Lopressor will monitor.  Avoid Imdur or Norvasc. Edema - Lasix PRN as allowed by her BP with TEDs.  3.  Hyperkalemia.  Treated on 03/10/2020.  Stopped steroids.  4.  CKD 3A.  Creatinine at or better than baseline.  5.  Smoking.  Counseled to quit, currently on NicoDerm patch.  6.  Hypertension.  Pressure slightly soft hydralazine stopped, Coreg stopped, low-dose Lopressor if blood pressure allows, hold further Lasix.  7.  Dementia with metabolic encephalopathy.  Seen by psych this admission, currently on Seroquel along with as needed Haldol. IVC in place, awaits inpatient psych placement.   8.  Anemia of chronic disease.  Monitor.  9.  Moderate protein calorie malnutrition.  On protein supplements.  10. GERD.  On Pepcid.       Condition - Extremely Guarded  Family Communication  : [name] [phone] on 03/10/2020, 03/13/20  Code Status :  DNR  Consults  :  Psych, cardiology  PUD Prophylaxis :     Procedures  :     TTE -  1. Severe concentric LVH with cavity obliteration of mid cavity and apex with systole consistent with hypertrophic cardiomyopathy Turbulent flow through LV/LVOT but no significant obstruction at rest. . Left ventricular ejection fraction, by estimation,  is 70 to 75%. The left  ventricle has hyperdynamic function. The left ventricle has no regional wall motion abnormalities. Left ventricular diastolic parameters are consistent with Grade I diastolic dysfunction (impaired relaxation).  2. Right ventricular systolic function is normal. The right ventricular size is normal. There is normal pulmonary artery systolic pressure.  3. A small pericardial effusion is present.  4. The mitral valve is abnormal.  5. The inferior vena cava is normal in size with greater than 50% respiratory variability, suggesting right atrial pressure of 3 mmHg  CT head - Non acute      Disposition Plan  :    Status is: Inpatient  Remains inpatient appropriate because:Unsafe d/c plan   Dispo: The patient is from: Home              Anticipated d/c is to: [facility]              Anticipated d/c date is: 1 day              Patient currently is medically stable to d/c.   Difficult to place patient Yes  DVT Prophylaxis  :  Lovenox    Lab Results  Component Value Date   PLT 211 03/14/2020    Diet :  Diet Order            Diet Heart Room service appropriate? Yes; Fluid consistency: Thin  Diet effective now                  Inpatient Medications  Scheduled Meds: . (feeding supplement) PROSource Plus  30 mL Oral BID BM  . enoxaparin (LOVENOX) injection  30 mg Subcutaneous Q24H  . famotidine  20 mg Oral Daily  . feeding supplement  237 mL Oral TID BM  . metoprolol tartrate  50 mg Oral BID  . mometasone-formoterol  2 puff Inhalation BID  . multivitamin with minerals  1 tablet Oral Daily  .  nicotine  21 mg Transdermal Daily  . polyethylene glycol  17 g Oral BID  . QUEtiapine  25 mg Oral BID  . senna-docusate  1 tablet Oral BID   Continuous Infusions:  PRN Meds:.acetaminophen, albuterol, alum & mag hydroxide-simeth, haloperidol lactate, [DISCONTINUED] ondansetron **OR** ondansetron (ZOFRAN) IV  Antibiotics  :    Anti-infectives (From admission, onward)   None       Time Spent  in minutes  30   Susa RaringPrashant Singh M.D on 03/15/2020 at 10:46 AM  To page go to [email or web address]   [facility] -  Office  [phone]    See all Orders from today for further details    Objective:   Vitals:   03/14/20 1559 03/14/20 2051 03/15/20 0336 03/15/20 1007  BP: (!) 158/68 125/65 (!) 148/70 (!) 143/71  Pulse: 80 85 (!) 51 (!) 51  Resp:  20 18 18   Temp:  (!) 101.4 F (38.6 C) 97.6 F (36.4 C) 97.7 F (36.5 C)  TempSrc:  Oral Oral Oral  SpO2: 92% 90% 100% 99%  Weight:      Height:        Wt Readings from Last 3 Encounters:  02/26/20 48 kg  02/16/20 48 kg    No intake or output data in the 24 hours ending 03/15/20 1046   Physical Exam  Awake, slightly sleepy this morning according to staff did not sleep well last night, moving all 4 extremities to commands, [place].AT,PERRAL Supple Neck,No JVD, No cervical lymphadenopathy appriciated.  Symmetrical Chest wall movement, Good air movement bilaterally, CTAB RRR,No Gallops, Rubs or new Murmurs, No Parasternal Heave +ve B.Sounds, Abd Soft, No tenderness, No organomegaly appriciated, No rebound - guarding or rigidity. No Cyanosis, TED stockings in both lower extremities      Data Review:    CBC Recent Labs  Lab 03/09/20 0415 03/10/20 0123 03/14/20 0331  WBC 7.3 8.9 6.6  HGB 11.9* 12.6 11.6*  HCT 36.9 39.4 35.3*  PLT 201 183 211  MCV 92.0 92.3 91.9  MCH 29.7 29.5 30.2  MCHC 32.2 32.0 32.9  RDW 16.8* 16.9* 17.5*  LYMPHSABS 0.7 0.6*  --   MONOABS 0.4 0.4  --   EOSABS 0.0 0.0  --   BASOSABS 0.0 0.0  --     Recent Labs  Lab 03/09/20 0415 03/10/20 0123 03/11/20 0213 03/12/20 0439 03/13/20 0151 03/14/20 0331 03/15/20 0403  NA 139 137 140 141 140 138 138  K 4.7 5.6* 3.3* 4.2 4.1 4.2 4.0  CL 95* 97* 95* 95* 93* 95* 94*  CO2 37* 26 36* 37* 35* 34* 37*  GLUCOSE 89 87 94 91 101* 87 92  BUN 55* 52* 30* 32* 38* 36* 27*  CREATININE 1.32* 0.96 0.92 1.14* 1.24* 1.15* 1.24*  CALCIUM 8.7* 8.7* 8.8* 9.2  8.8* 8.8* 8.5*  AST 39 44*  --   --   --   --   --   ALT 43 40  --   --   --   --   --   ALKPHOS 32* 38  --   --   --   --   --   BILITOT 0.7 0.9  --   --   --   --   --   ALBUMIN 2.3* 2.4*  --   --   --   --   --   MG 2.1 2.2  --   --  2.0 2.0 2.0    ------------------------------------------------------------------------------------------------------------------ No results for input(s):  CHOL, HDL, LDLCALC, TRIG, CHOLHDL, LDLDIRECT in the last 72 hours.  No results found for: HGBA1C ------------------------------------------------------------------------------------------------------------------ No results for input(s): TSH, T4TOTAL, T3FREE, THYROIDAB in the last 72 hours.  Invalid input(s): FREET3  Cardiac Enzymes No results for input(s): CKMB, TROPONINI, MYOGLOBIN in the last 168 hours.  Invalid input(s): CK ------------------------------------------------------------------------------------------------------------------    Component Value Date/Time   BNP 918.7 (H) 03/06/2020 0249    Micro Results No results found for this or any previous visit (from the past 240 hour(s)).  Radiology Reports DG Chest 2 View  Result Date: 02/25/2020 CLINICAL DATA:  Shortness of breath EXAM: CHEST - 2 VIEW COMPARISON:  CT 02/13/2020, radiograph 02/12/2020 FINDINGS: Extensive reticulonodular opacities throughout the lungs, much of which is attributable to the extensive calcified pleural plaque seen on comparison CT and admixed regions of granulomata. Additionally, some coarsened interstitial and bronchitic features are compatible with emphysema better demonstrated on the cross-sectional imaging. There are however some increasing areas of more hazy and patchy opacity in both lungs, particularly towards the lung bases which could reflect developing infection or edema. No pneumothorax. No visible pleural effusion. Stable cardiomediastinal contours with a calcified, tortuous aorta. No acute osseous or  soft tissue abnormality. IMPRESSION: 1. Increasing areas of hazy and patchy opacity in both lungs, particularly towards the lung bases which could reflect developing infection or edema. 2. Redemonstrated reticulonodular opacities compatible with a combination of calcified pleural plaque and calcified granulomata seen on comparison study as well as features chronic bronchitis and emphysema also present prior cross-sectional imaging. Electronically Signed   By: Kreg Shropshire M.D.   On: 02/25/2020 00:03   CT Head Wo Contrast  Result Date: 02/25/2020 CLINICAL DATA:  Worsening dementia and hallucinations EXAM: CT HEAD WITHOUT CONTRAST TECHNIQUE: Contiguous axial images were obtained from the base of the skull through the vertex without intravenous contrast. COMPARISON:  None. FINDINGS: Brain: No evidence of acute infarction, hemorrhage, hydrocephalus, extra-axial collection or mass lesion/mass effect. Mild atrophic changes are noted. Vascular: No hyperdense vessel or unexpected calcification. Skull: Normal. Negative for fracture or focal lesion. Sinuses/Orbits: No acute finding. Other: None. IMPRESSION: Mild atrophic changes without acute abnormality. Electronically Signed   By: Alcide Clever M.D.   On: 02/25/2020 00:59   DG CHEST PORT 1 VIEW  Result Date: 03/08/2020 CLINICAL DATA:  Shortness of breath.  COVID-19 positive EXAM: PORTABLE CHEST 1 VIEW COMPARISON:  March 06, 2020 FINDINGS: Multiple calcified granulomas are noted throughout the lungs. There is slight right base atelectasis. Lungs elsewhere are clear. Stable cardiac prominence is noted. The pulmonary vascularity is within normal limits. No adenopathy. There is aortic atherosclerosis. No bone lesions. IMPRESSION: Calcified granulomas throughout lungs are stable. Mild right base atelectasis. Lungs elsewhere clear. Stable cardiac prominence. Aortic Atherosclerosis (ICD10-I70.0). Electronically Signed   By: Bretta Bang III M.D.   On: 03/08/2020  08:47   DG CHEST PORT 1 VIEW  Result Date: 03/06/2020 CLINICAL DATA:  Shortness of breath.  COVID-19 positive EXAM: PORTABLE CHEST 1 VIEW COMPARISON:  March 05, 2020 FINDINGS: Multiple calcified granulomas are noted throughout the lungs. There is atelectasis in the lung bases. There is an equivocal small right pleural effusion. Heart is enlarged with pulmonary vascularity normal. There is aortic atherosclerosis. No bone lesions. IMPRESSION: Bibasilar atelectasis with equivocal right pleural effusion. No consolidation. Multiple calcified granulomas throughout the lungs. Stable cardiomegaly. Aortic Atherosclerosis (ICD10-I70.0). Electronically Signed   By: Bretta Bang III M.D.   On: 03/06/2020 09:27   DG CHEST PORT 1 VIEW  Result Date: 03/05/2020 CLINICAL DATA:  80 year old female with shortness of breath. Emphysema. Calcified pleural disease. EXAM: PORTABLE CHEST 1 VIEW COMPARISON:  Portable chest 03/04/2020 and earlier. FINDINGS: Portable AP upright view at 0437 hours. Rotated to the right today. Calcified aortic atherosclerosis. Stable cardiomegaly and mediastinal contours. Coarse and calcified bilateral lung opacity is stable. No pneumothorax or pleural effusion identified. No areas of worsening ventilation allowing for patient rotation. No acute osseous abnormality identified. IMPRESSION: 1. Chronic lung disease and cardiomegaly. 2.  No acute cardiopulmonary abnormality identified. Electronically Signed   By: Odessa Fleming M.D.   On: 03/05/2020 06:43   DG CHEST PORT 1 VIEW  Result Date: 03/04/2020 CLINICAL DATA:  80 year old female with shortness of breath. EXAM: PORTABLE CHEST 1 VIEW COMPARISON:  Chest radiograph dated 02/26/2020. FINDINGS: Bilateral calcified pleural plaques. There is background of emphysema. No new consolidation. There is no pleural effusion pneumothorax. There is moderate cardiomegaly. Atherosclerotic calcification of the aorta. No acute osseous pathology. IMPRESSION: 1. No  acute cardiopulmonary process. 2. Emphysema and calcified pleural plaques. Electronically Signed   By: Elgie Collard M.D.   On: 03/04/2020 17:32   DG Chest Port 1 View  Result Date: 02/26/2020 CLINICAL DATA:  Hypoxia, COVID-19 positivity EXAM: PORTABLE CHEST 1 VIEW COMPARISON:  02/24/2020 FINDINGS: Cardiac shadow is stable. Tortuous thoracic aorta with calcification is noted. Patient rotation to the right accentuates the mediastinal markings. Persistent patchy airspace opacities are noted similar to that seen on the prior exam but slightly increased in the right base. Multiple calcified nodular densities are again seen throughout both lungs stable in appearance from the prior exam related to pleural plaques. IMPRESSION: Patchy airspace opacities superimposed over chronic calcified pleural plaques. Electronically Signed   By: Alcide Clever M.D.   On: 02/26/2020 02:38   ECHOCARDIOGRAM LIMITED  Result Date: 02/26/2020    ECHOCARDIOGRAM LIMITED REPORT   Patient Name:   Patricia Terrell Date of Exam: 02/26/2020 Medical Rec #:  [id number]      Height:       65.0 in Accession #:    [id number]     Weight:       105.8 lb Date of Birth:  April 23, 1940       BSA:          1.509 m Patient Age:    79 years       BP:           124/84 mmHg Patient Gender: F              HR:           55 bpm. Exam Location:  Inpatient Procedure: Limited Echo, Color Doppler and Cardiac Doppler Indications:    acute diastolic chf  History:        Patient has no prior history of Echocardiogram examinations.                 CHF, COPD and Covid, Signs/Symptoms:dementia; Risk                 Factors:Hypertension.  Sonographer:    Delcie Roch Referring Phys: [id number] COSTIN M GHERGHE IMPRESSIONS  1. Severe concentric LVH with cavity obliteration of mid cavity and apex with systole consistent with hypertrophic cardiomyopathy Turbulent flow through LV/LVOT but no significant obstruction at rest. . Left ventricular ejection fraction, by estimation,  is 70  to 75%. The left ventricle has hyperdynamic function. The left ventricle has no regional wall motion abnormalities. Left ventricular diastolic parameters are  consistent with Grade I diastolic dysfunction (impaired relaxation).  2. Right ventricular systolic function is normal. The right ventricular size is normal. There is normal pulmonary artery systolic pressure.  3. A small pericardial effusion is present.  4. The mitral valve is abnormal.  5. The inferior vena cava is normal in size with greater than 50% respiratory variability, suggesting right atrial pressure of 3 mmHg. FINDINGS  Left Ventricle: Severe concentric LVH with cavity obliteration of mid cavity and apex with systole consistent with hypertrophic cardiomyopathy Turbulent flow through LV/LVOT but no significant obstruction at rest. Left ventricular ejection fraction, by estimation, is 70 to 75%. The left ventricle has hyperdynamic function. The left ventricle has no regional wall motion abnormalities. The left ventricular internal cavity size was small. There is concentric left ventricular hypertrophy. Left ventricular diastolic parameters are consistent with Grade I diastolic dysfunction (impaired relaxation). Right Ventricle: The right ventricular size is normal. Right vetricular wall thickness was not assessed. Right ventricular systolic function is normal. There is normal pulmonary artery systolic pressure. The tricuspid regurgitant velocity is 2.52 m/s, and with an assumed right atrial pressure of 3 mmHg, the estimated right ventricular systolic pressure is 28.4 mmHg. Left Atrium: Left atrial size was normal in size. Right Atrium: Right atrial size was normal in size. Pericardium: A small pericardial effusion is present. Mitral Valve: The mitral valve is abnormal. There is mild thickening of the mitral valve leaflet(s). Tricuspid Valve: The tricuspid valve is grossly normal. Tricuspid valve regurgitation is mild. Pulmonic Valve: The pulmonic valve  was grossly normal. Venous: The inferior vena cava is normal in size with greater than 50% respiratory variability, suggesting right atrial pressure of 3 mmHg. LEFT VENTRICLE PLAX 2D LVIDd:         3.00 cm  Diastology LVIDs:         1.70 cm  LV e' medial:    3.26 cm/s LV PW:         1.50 cm  LV E/e' medial:  13.0 LV IVS:        1.70 cm  LV e' lateral:   4.79 cm/s LVOT diam:     1.90 cm  LV E/e' lateral: 8.9 LV SV:         82 LV SV Index:   54 LVOT Area:     2.84 cm  IVC IVC diam: 1.20 cm LEFT ATRIUM         Index LA diam:    3.80 cm 2.52 cm/m  AORTIC VALVE LVOT Vmax:   170.00 cm/s LVOT Vmean:  102.000 cm/s LVOT VTI:    0.288 m  AORTA Ao Asc diam: 3.10 cm MITRAL VALVE               TRICUSPID VALVE MV Area (PHT): 2.87 cm    TR Peak grad:   25.4 mmHg MV Decel Time: 264 msec    TR Vmax:        252.00 cm/s MV E velocity: 42.40 cm/s MV A velocity: 81.40 cm/s  SHUNTS MV E/A ratio:  0.52        Systemic VTI:  0.29 m                            Systemic Diam: 1.90 cm Dietrich Pates MD Electronically signed by Dietrich Pates MD Signature Date/Time: 02/26/2020/9:40:19 PM    Final

## 2020-03-16 DIAGNOSIS — J9601 Acute respiratory failure with hypoxia: Secondary | ICD-10-CM | POA: Diagnosis not present

## 2020-03-16 DIAGNOSIS — F0281 Dementia in other diseases classified elsewhere with behavioral disturbance: Secondary | ICD-10-CM | POA: Diagnosis not present

## 2020-03-16 LAB — BASIC METABOLIC PANEL
Anion gap: 13 (ref 5–15)
BUN: 27 mg/dL — ABNORMAL HIGH (ref 8–23)
CO2: 30 mmol/L (ref 22–32)
Calcium: 8.8 mg/dL — ABNORMAL LOW (ref 8.9–10.3)
Chloride: 92 mmol/L — ABNORMAL LOW (ref 98–111)
Creatinine, Ser: 0.99 mg/dL (ref 0.44–1.00)
GFR, Estimated: 58 mL/min — ABNORMAL LOW (ref >60)
Glucose, Bld: 103 mg/dL — ABNORMAL HIGH (ref 70–99)
Potassium: 3.9 mmol/L (ref 3.5–5.1)
Sodium: 135 mmol/L (ref 135–145)

## 2020-03-16 LAB — MAGNESIUM: Magnesium: 1.7 mg/dL (ref 1.7–2.4)

## 2020-03-16 MED ORDER — FUROSEMIDE 40 MG PO TABS
40.0000 mg | ORAL_TABLET | Freq: Once | ORAL | Status: AC
  Administered 2020-03-16: 40 mg via ORAL
  Filled 2020-03-16: qty 1

## 2020-03-16 MED ORDER — AMLODIPINE BESYLATE 5 MG PO TABS: 5.0000 mg | ORAL_TABLET | Freq: Every day | ORAL | Status: DC

## 2020-03-16 MED ORDER — MAGNESIUM SULFATE IN D5W 1-5 GM/100ML-% IV SOLN
1.0000 g | Freq: Once | INTRAVENOUS | Status: AC
  Administered 2020-03-16: 1 g via INTRAVENOUS
  Filled 2020-03-16: qty 100

## 2020-03-16 NOTE — Progress Notes (Signed)
PROGRESS NOTE                                                                                                                                                                                                             Patient Demographics:    Patricia Terrell, is a 80 y.o. female, DOB - [birth year], CZY:[id number]  Outpatient Primary MD for the patient is Pcp, No    LOS - 19  Admit date - 02/24/2020    Chief Complaint  Patient presents with  . Psychiatric Evaluation  . Altered Mental Status       Brief Narrative (HPI from H&P)   80 year old African-American female with past medical history significant for but not limited to dementia, COPD/emphysema, hypertension as well as other comorbidities was admitted from 02/13/20-02/16/20 for COPD exacerbation and was found to be Covid positive at that time, she was treated with 3 days of remdesivir and discharged home, she came back to the hospital on 02/26/2020 with shortness of breath.  This time she was diagnosed with worsening COVID-19 pneumonia along with acute on chronic diastolic CHF and admitted to the hospital.  She developed worsening encephalopathy and became aggressive, psych was consulted and currently she has been IVC to be placed to inpatient psych facility. She was transferred to my care on 03/10/2020 on day 13 of her hospital stay.  Of note patient has moved from [place] with her daughter 1 month ago, according to the daughter patient was having some memory issues for a while at [place] she is unaware of having any behavioral issues there.   Subjective:   Patient sitting in chair denies any headache chest or abdominal pain, says she wants to go home, no shortness of breath or focal weakness.   Assessment  & Plan :   1. Acute Hypoxic Resp. Failure due to Acute Covid 19 Viral Pneumonitis lung with acute on chronic diastolic CHF EF 75% on recent echocardiogram -she has  finished her COVID specific treatment, steroids stopped on 03/10/2020, she has been adequately diuresed with as needed Lasix.  Currently on supportive care with breathing treatments as needed, symptom-free on room air, has been seen by cardiology this admission.  She has finished her COVID-19 specific treatment and this problem has clinically resolved.  Her initial Covid positive date is 02/13/2020, she is clearly out  of her quarantine.   2.  Severe LVH consistent with hypertrophic cardiomyopathy.  Kindly review echo, seen by cardiology, recommend beta-blocker, placed on moderate dose Lopressor will monitor.  Avoid Imdur or Norvasc. Edema - Lasix PRN as allowed by her BP with TEDs.  1 dose given on 03/16/2020.  3.  Hyperkalemia.  Treated on 03/10/2020.  Stopped steroids.  4.  CKD 3A.  Creatinine at or better than baseline.  5.  Smoking.  Counseled to quit, currently on NicoDerm patch.  6.  Hypertension.  Blood pressure stable on beta-blocker, as needed Lasix, monitor and adjust.  7.  Dementia with metabolic encephalopathy.  Seen by psych this admission, currently on Seroquel along with as needed Haldol. IVC in place, awaits inpatient psych placement.   8.  Anemia of chronic disease.  Monitor.  9.  Moderate protein calorie malnutrition.  On protein supplements.  10. GERD.  On Pepcid.       Condition - Extremely Guarded  Family Communication  : Daughter Patricia Terrell [phone] on 03/10/2020, 03/13/20  Code Status :  DNR  Consults  :  Psych, cardiology  PUD Prophylaxis :     Procedures  :     TTE -  1. Severe concentric LVH with cavity obliteration of mid cavity and apex with systole consistent with hypertrophic cardiomyopathy Turbulent flow through LV/LVOT but no significant obstruction at rest. . Left ventricular ejection fraction, by estimation,  is 70 to 75%. The left ventricle has hyperdynamic function. The left ventricle has no regional wall motion abnormalities. Left ventricular  diastolic parameters are consistent with Grade I diastolic dysfunction (impaired relaxation).  2. Right ventricular systolic function is normal. The right ventricular size is normal. There is normal pulmonary artery systolic pressure.  3. A small pericardial effusion is present.  4. The mitral valve is abnormal.  5. The inferior vena cava is normal in size with greater than 50% respiratory variability, suggesting right atrial pressure of 3 mmHg  CT head - Non acute      Disposition Plan  :    Status is: Inpatient  Remains inpatient appropriate because:Unsafe d/c plan   Dispo: The patient is from: Home              Anticipated d/c is to: [facility]              Anticipated d/c date is: 1 day              Patient currently is medically stable to d/c.   Difficult to place patient Yes  DVT Prophylaxis  :  Lovenox    Lab Results  Component Value Date   PLT 211 03/14/2020    Diet :  Diet Order            Diet Heart Room service appropriate? Yes; Fluid consistency: Thin  Diet effective now                  Inpatient Medications  Scheduled Meds: . (feeding supplement) PROSource Plus  30 mL Oral BID BM  . enoxaparin (LOVENOX) injection  30 mg Subcutaneous Q24H  . famotidine  20 mg Oral Daily  . feeding supplement  237 mL Oral TID BM  . furosemide  40 mg Oral Once  . metoprolol tartrate  50 mg Oral BID  . mometasone-formoterol  2 puff Inhalation BID  . multivitamin with minerals  1 tablet Oral Daily  . nicotine  21 mg Transdermal Daily  . polyethylene glycol  17  g Oral BID  . QUEtiapine  25 mg Oral BID  . senna-docusate  1 tablet Oral BID   Continuous Infusions: . magnesium sulfate bolus IVPB     PRN Meds:.acetaminophen, albuterol, alum & mag hydroxide-simeth, haloperidol lactate, [DISCONTINUED] ondansetron **OR** ondansetron (ZOFRAN) IV  Antibiotics  :    Anti-infectives (From admission, onward)   None       Time Spent in minutes  30   Susa Raring M.D on  03/16/2020 at 11:36 AM  To page go to [email or web address]   [facility] -  Office  [phone]    See all Orders from today for further details    Objective:   Vitals:   03/15/20 1007 03/15/20 2120 03/16/20 0348 03/16/20 0843  BP: (!) 143/71 138/72 136/70 (!) 163/82  Pulse: (!) 51 67 (!) 58 (!) 58  Resp: Temp: 97.7 F (36.5 C) 97.8 F (36.6 C) 97.6 F (36.4 C) 98.4 F (36.9 C)  TempSrc: Oral Oral Oral Oral  SpO2: 99% 93% 93% 100%  Weight:      Height:        Wt Readings from Last 3 Encounters:  02/26/20 48 kg  02/16/20 48 kg     Intake/Output Summary (Last 24 hours) at 03/16/2020 1136 Last data filed at 03/16/2020 0900 Gross per 24 hour  Intake 480 ml  Output 1 ml  Net 479 ml     Physical Exam  Awake sitting in chair, mildly confused but calm, no focal deficits, Dolores.AT,PERRAL Supple Neck,No JVD, No cervical lymphadenopathy appriciated.  Symmetrical Chest wall movement, Good air movement bilaterally, CTAB RRR,No Gallops, Rubs or new Murmurs, No Parasternal Heave +ve B.Sounds, Abd Soft, No tenderness, No organomegaly appriciated, No rebound - guarding or rigidity. No Cyanosis, Clubbing or edema, No new Rash or bruise     Data Review:    CBC Recent Labs  Lab 03/10/20 0123 03/14/20 0331  WBC 8.9 6.6  HGB 12.6 11.6*  HCT 39.4 35.3*  PLT 183 211  MCV 92.3 91.9  MCH 29.5 30.2  MCHC 32.0 32.9  RDW 16.9* 17.5*  LYMPHSABS 0.6*  --   MONOABS 0.4  --   EOSABS 0.0  --   BASOSABS 0.0  --     Recent Labs  Lab 03/10/20 0123 03/11/20 0213 03/12/20 0439 03/13/20 0151 03/14/20 0331 03/15/20 0403 03/16/20 0245  NA 137   < > 141 140 138 138 135  K 5.6*   < > 4.2 4.1 4.2 4.0 3.9  CL 97*   < > 95* 93* 95* 94* 92*  CO2 26   < > 37* 35* 34* 37* 30  GLUCOSE 87   < > 91 101* 87 92 103*  BUN 52*   < > 32* 38* 36* 27* 27*  CREATININE 0.96   < > 1.14* 1.24* 1.15* 1.24* 0.99  CALCIUM 8.7*   < > 9.2 8.8* 8.8* 8.5* 8.8*  AST 44*  --   --   --    --   --   --   ALT 40  --   --   --   --   --   --   ALKPHOS 38  --   --   --   --   --   --   BILITOT 0.9  --   --   --   --   --   --   ALBUMIN 2.4*  --   --   --   --   --   --  MG 2.2  --   --  2.0 2.0 2.0 1.7   < > = values in this interval not displayed.    ------------------------------------------------------------------------------------------------------------------ No results for input(s): CHOL, HDL, LDLCALC, TRIG, CHOLHDL, LDLDIRECT in the last 72 hours.  No results found for: HGBA1C ------------------------------------------------------------------------------------------------------------------ No results for input(s): TSH, T4TOTAL, T3FREE, THYROIDAB in the last 72 hours.  Invalid input(s): FREET3  Cardiac Enzymes No results for input(s): CKMB, TROPONINI, MYOGLOBIN in the last 168 hours.  Invalid input(s): CK ------------------------------------------------------------------------------------------------------------------    Component Value Date/Time   BNP 918.7 (H) 03/06/2020 0249    Micro Results No results found for this or any previous visit (from the past 240 hour(s)).  Radiology Reports DG Chest 2 View  Result Date: 02/25/2020 CLINICAL DATA:  Shortness of breath EXAM: CHEST - 2 VIEW COMPARISON:  CT 02/13/2020, radiograph 02/12/2020 FINDINGS: Extensive reticulonodular opacities throughout the lungs, much of which is attributable to the extensive calcified pleural plaque seen on comparison CT and admixed regions of granulomata. Additionally, some coarsened interstitial and bronchitic features are compatible with emphysema better demonstrated on the cross-sectional imaging. There are however some increasing areas of more hazy and patchy opacity in both lungs, particularly towards the lung bases which could reflect developing infection or edema. No pneumothorax. No visible pleural effusion. Stable cardiomediastinal contours with a calcified, tortuous aorta. No acute  osseous or soft tissue abnormality. IMPRESSION: 1. Increasing areas of hazy and patchy opacity in both lungs, particularly towards the lung bases which could reflect developing infection or edema. 2. Redemonstrated reticulonodular opacities compatible with a combination of calcified pleural plaque and calcified granulomata seen on comparison study as well as features chronic bronchitis and emphysema also present prior cross-sectional imaging. Electronically Signed   By: Kreg Shropshire M.D.   On: 02/25/2020 00:03   CT Head Wo Contrast  Result Date: 02/25/2020 CLINICAL DATA:  Worsening dementia and hallucinations EXAM: CT HEAD WITHOUT CONTRAST TECHNIQUE: Contiguous axial images were obtained from the base of the skull through the vertex without intravenous contrast. COMPARISON:  None. FINDINGS: Brain: No evidence of acute infarction, hemorrhage, hydrocephalus, extra-axial collection or mass lesion/mass effect. Mild atrophic changes are noted. Vascular: No hyperdense vessel or unexpected calcification. Skull: Normal. Negative for fracture or focal lesion. Sinuses/Orbits: No acute finding. Other: None. IMPRESSION: Mild atrophic changes without acute abnormality. Electronically Signed   By: Alcide Clever M.D.   On: 02/25/2020 00:59   DG CHEST PORT 1 VIEW  Result Date: 03/08/2020 CLINICAL DATA:  Shortness of breath.  COVID-19 positive EXAM: PORTABLE CHEST 1 VIEW COMPARISON:  March 06, 2020 FINDINGS: Multiple calcified granulomas are noted throughout the lungs. There is slight right base atelectasis. Lungs elsewhere are clear. Stable cardiac prominence is noted. The pulmonary vascularity is within normal limits. No adenopathy. There is aortic atherosclerosis. No bone lesions. IMPRESSION: Calcified granulomas throughout lungs are stable. Mild right base atelectasis. Lungs elsewhere clear. Stable cardiac prominence. Aortic Atherosclerosis (ICD10-I70.0). Electronically Signed   By: Bretta Bang III M.D.   On:  03/08/2020 08:47   DG CHEST PORT 1 VIEW  Result Date: 03/06/2020 CLINICAL DATA:  Shortness of breath.  COVID-19 positive EXAM: PORTABLE CHEST 1 VIEW COMPARISON:  March 05, 2020 FINDINGS: Multiple calcified granulomas are noted throughout the lungs. There is atelectasis in the lung bases. There is an equivocal small right pleural effusion. Heart is enlarged with pulmonary vascularity normal. There is aortic atherosclerosis. No bone lesions. IMPRESSION: Bibasilar atelectasis with equivocal right pleural effusion. No consolidation. Multiple calcified  granulomas throughout the lungs. Stable cardiomegaly. Aortic Atherosclerosis (ICD10-I70.0). Electronically Signed   By: Bretta Bang III M.D.   On: 03/06/2020 09:27   DG CHEST PORT 1 VIEW  Result Date: 03/05/2020 CLINICAL DATA:  80 year old female with shortness of breath. Emphysema. Calcified pleural disease. EXAM: PORTABLE CHEST 1 VIEW COMPARISON:  Portable chest 03/04/2020 and earlier. FINDINGS: Portable AP upright view at 0437 hours. Rotated to the right today. Calcified aortic atherosclerosis. Stable cardiomegaly and mediastinal contours. Coarse and calcified bilateral lung opacity is stable. No pneumothorax or pleural effusion identified. No areas of worsening ventilation allowing for patient rotation. No acute osseous abnormality identified. IMPRESSION: 1. Chronic lung disease and cardiomegaly. 2.  No acute cardiopulmonary abnormality identified. Electronically Signed   By: Odessa Fleming M.D.   On: 03/05/2020 06:43   DG CHEST PORT 1 VIEW  Result Date: 03/04/2020 CLINICAL DATA:  80 year old female with shortness of breath. EXAM: PORTABLE CHEST 1 VIEW COMPARISON:  Chest radiograph dated 02/26/2020. FINDINGS: Bilateral calcified pleural plaques. There is background of emphysema. No new consolidation. There is no pleural effusion pneumothorax. There is moderate cardiomegaly. Atherosclerotic calcification of the aorta. No acute osseous pathology.  IMPRESSION: 1. No acute cardiopulmonary process. 2. Emphysema and calcified pleural plaques. Electronically Signed   By: Elgie Collard M.D.   On: 03/04/2020 17:32   DG Chest Port 1 View  Result Date: 02/26/2020 CLINICAL DATA:  Hypoxia, COVID-19 positivity EXAM: PORTABLE CHEST 1 VIEW COMPARISON:  02/24/2020 FINDINGS: Cardiac shadow is stable. Tortuous thoracic aorta with calcification is noted. Patient rotation to the right accentuates the mediastinal markings. Persistent patchy airspace opacities are noted similar to that seen on the prior exam but slightly increased in the right base. Multiple calcified nodular densities are again seen throughout both lungs stable in appearance from the prior exam related to pleural plaques. IMPRESSION: Patchy airspace opacities superimposed over chronic calcified pleural plaques. Electronically Signed   By: Alcide Clever M.D.   On: 02/26/2020 02:38   ECHOCARDIOGRAM LIMITED  Result Date: 02/26/2020    ECHOCARDIOGRAM LIMITED REPORT   Patient Name:   GIOVANNINA MUN Date of Exam: 02/26/2020 Medical Rec #:  [id number]      Height:       65.0 in Accession #:    [id number]     Weight:       105.8 lb Date of Birth:  10-31-1940       BSA:          1.509 m Patient Age:    79 years       BP:           124/84 mmHg Patient Gender: F              HR:           55 bpm. Exam Location:  Inpatient Procedure: Limited Echo, Color Doppler and Cardiac Doppler Indications:    acute diastolic chf  History:        Patient has no prior history of Echocardiogram examinations.                 CHF, COPD and Covid, Signs/Symptoms:dementia; Risk                 Factors:Hypertension.  Sonographer:    Delcie Roch Referring Phys: [id number] COSTIN M GHERGHE IMPRESSIONS  1. Severe concentric LVH with cavity obliteration of mid cavity and apex with systole consistent with hypertrophic cardiomyopathy Turbulent flow through LV/LVOT but no significant obstruction at rest. .  Left ventricular ejection fraction, by  estimation,  is 70 to 75%. The left ventricle has hyperdynamic function. The left ventricle has no regional wall motion abnormalities. Left ventricular diastolic parameters are consistent with Grade I diastolic dysfunction (impaired relaxation).  2. Right ventricular systolic function is normal. The right ventricular size is normal. There is normal pulmonary artery systolic pressure.  3. A small pericardial effusion is present.  4. The mitral valve is abnormal.  5. The inferior vena cava is normal in size with greater than 50% respiratory variability, suggesting right atrial pressure of 3 mmHg. FINDINGS  Left Ventricle: Severe concentric LVH with cavity obliteration of mid cavity and apex with systole consistent with hypertrophic cardiomyopathy Turbulent flow through LV/LVOT but no significant obstruction at rest. Left ventricular ejection fraction, by estimation, is 70 to 75%. The left ventricle has hyperdynamic function. The left ventricle has no regional wall motion abnormalities. The left ventricular internal cavity size was small. There is concentric left ventricular hypertrophy. Left ventricular diastolic parameters are consistent with Grade I diastolic dysfunction (impaired relaxation). Right Ventricle: The right ventricular size is normal. Right vetricular wall thickness was not assessed. Right ventricular systolic function is normal. There is normal pulmonary artery systolic pressure. The tricuspid regurgitant velocity is 2.52 m/s, and with an assumed right atrial pressure of 3 mmHg, the estimated right ventricular systolic pressure is 28.4 mmHg. Left Atrium: Left atrial size was normal in size. Right Atrium: Right atrial size was normal in size. Pericardium: A small pericardial effusion is present. Mitral Valve: The mitral valve is abnormal. There is mild thickening of the mitral valve leaflet(s). Tricuspid Valve: The tricuspid valve is grossly normal. Tricuspid valve regurgitation is mild. Pulmonic Valve:  The pulmonic valve was grossly normal. Venous: The inferior vena cava is normal in size with greater than 50% respiratory variability, suggesting right atrial pressure of 3 mmHg. LEFT VENTRICLE PLAX 2D LVIDd:         3.00 cm  Diastology LVIDs:         1.70 cm  LV e' medial:    3.26 cm/s LV PW:         1.50 cm  LV E/e' medial:  13.0 LV IVS:        1.70 cm  LV e' lateral:   4.79 cm/s LVOT diam:     1.90 cm  LV E/e' lateral: 8.9 LV SV:         82 LV SV Index:   54 LVOT Area:     2.84 cm  IVC IVC diam: 1.20 cm LEFT ATRIUM         Index LA diam:    3.80 cm 2.52 cm/m  AORTIC VALVE LVOT Vmax:   170.00 cm/s LVOT Vmean:  102.000 cm/s LVOT VTI:    0.288 m  AORTA Ao Asc diam: 3.10 cm MITRAL VALVE               TRICUSPID VALVE MV Area (PHT): 2.87 cm    TR Peak grad:   25.4 mmHg MV Decel Time: 264 msec    TR Vmax:        252.00 cm/s MV E velocity: 42.40 cm/s MV A velocity: 81.40 cm/s  SHUNTS MV E/A ratio:  0.52        Systemic VTI:  0.29 m                            Systemic Diam: 1.90 cm Gunnar Fusi  Tenny Crawoss MD Electronically signed by Dietrich PatesPaula Ross MD Signature Date/Time: 02/26/2020/9:40:19 PM    Final

## 2020-03-16 NOTE — Plan of Care (Signed)

## 2020-03-17 DIAGNOSIS — I5032 Chronic diastolic (congestive) heart failure: Secondary | ICD-10-CM | POA: Diagnosis not present

## 2020-03-17 DIAGNOSIS — J441 Chronic obstructive pulmonary disease with (acute) exacerbation: Secondary | ICD-10-CM | POA: Diagnosis not present

## 2020-03-17 DIAGNOSIS — F0281 Dementia in other diseases classified elsewhere with behavioral disturbance: Secondary | ICD-10-CM | POA: Diagnosis not present

## 2020-03-17 LAB — BASIC METABOLIC PANEL
Anion gap: 12 (ref 5–15)
BUN: 29 mg/dL — ABNORMAL HIGH (ref 8–23)
CO2: 32 mmol/L (ref 22–32)
Calcium: 8.5 mg/dL — ABNORMAL LOW (ref 8.9–10.3)
Chloride: 87 mmol/L — ABNORMAL LOW (ref 98–111)
Creatinine, Ser: 1.04 mg/dL — ABNORMAL HIGH (ref 0.44–1.00)
GFR, Estimated: 55 mL/min — ABNORMAL LOW (ref >60)
Glucose, Bld: 140 mg/dL — ABNORMAL HIGH (ref 70–99)
Potassium: 3.6 mmol/L (ref 3.5–5.1)
Sodium: 131 mmol/L — ABNORMAL LOW (ref 135–145)

## 2020-03-17 NOTE — TOC Progression Note (Signed)
Transition of Care [facility]) - Progression Note    Patient Details  Name: Patricia Terrell MRN: [id number] Date of Birth: [birth year]  Transition of [facility]) CM/SW Contact  Beckie Busing, RN Phone Number: [phone]  03/17/2020, 1:59 PM  Clinical Narrative:    Daughter at bedside requesting that MD call her in reference to her being able to bring her mother home. Daughter is concerned that her mother is just sitting in the hospital with no quality of life. Daughter wants to bring mother home with outpatient psych follow up. Message has been sent to MD. [facility] will continue to follow.   Expected Discharge Plan: Psychiatric Hospital Barriers to Discharge: Psych Bed not available  Expected Discharge Plan and Services Expected Discharge Plan: Psychiatric Hospital In-house Referral: Clinical Social Work   Post Acute Care Choice: Home Health Living arrangements for the past 2 months: Apartment                                       Social Determinants of Health (SDOH) Interventions    Readmission Risk Interventions No flowsheet data found.

## 2020-03-17 NOTE — Progress Notes (Signed)
   03/17/20 1300  Clinical Encounter Type  Visited With Patient and family together  Visit Type Initial  Referral From Nurse  Consult/Referral To Chaplain  Chaplain responded. The patient's daughter Chinita Greenland at the bedside requesting AD. Benjaman Lobe came to notarize. The chaplain advised that the patient was not aware of what she was signing and could not answer appropriate questions to ensure she understood.  This note was prepared by Deneen Harts, M.Div..  For questions please contact by phone [phone].

## 2020-03-17 NOTE — Progress Notes (Signed)
PROGRESS NOTE    Patricia Terrell  JSE:[id number] DOB: 06-21-1940 DOA: 02/24/2020 PCP: Pcp, No   Chief Complaint  Patient presents with  . Psychiatric Evaluation  . Altered Mental Status    Brief Narrative:  80 year old African-American female with past medical history significant for but not limited to dementia, COPD/emphysema, hypertension as well as other comorbidities was admitted from 02/13/20-02/16/20 for COPD exacerbation and was found to be Covid positive at that time, she was treated with 3 days of remdesivir and discharged home, she came back to the hospital on 02/26/2020 with shortness of breath.  This time she was diagnosed with worsening COVID-19 pneumonia along with acute on chronic diastolic CHF and admitted to the hospital.  She developed worsening encephalopathy and became aggressive, psych was consulted and currently she has been IVC to be placed to inpatient psych facility.  Of note patient has moved from [place] with her daughter 1 month ago, according to the daughter patient was having some memory issues for a while at [place] she is unaware of having any behavioral issues there.  Assessment & Plan:   Principal Problem:   Dementia with behavioral disturbance (HCC) Active Problems:   COPD (chronic obstructive pulmonary disease) (HCC)   HTN (hypertension)   Chronic diastolic CHF (congestive heart failure) (HCC)   Acute respiratory failure with hypoxia (HCC)   Acute hypoxemic respiratory failure (HCC)   Protein-calorie malnutrition, severe  1. Acute Hypoxic Resp. Failure due to Acute Covid 19 Viral Pneumonitis lung with acute on chronic diastolic CHF EF 75% on recent echocardiogram  1. Completed covid specific treatment 2. S/p lasix 3. Currently on RA 4. Last CXR 2/14 with calcified granulomas, mild R base atelectasis 5. Her initial Covid positive date is 02/13/2020, she's completed her quarantine.  2.  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  - cardiology c/s - recommended low dose beta blocker as tolerated.  Avoid overdiuresis.  Avoid Imdur or Norvasc.   3.  Hyperkalemia.  Treated on 03/10/2020.  Stopped steroids.  4.  CKD 3A.  Creatinine at or better than baseline.  5.  Smoking.  Counseled to quit, currently on NicoDerm patch.  6.  Hypertension.  Blood pressure stable on beta-blocker, as needed Lasix, monitor and adjust.  7.  Dementia with metabolic encephalopathy.  Seen by psych this admission, currently on Seroquel along with as needed Haldol. IVC in place, awaits inpatient psych placement.  Of note, daughter asking about psych reeval and possibly having follow up outpatient.  Pt currently IVC'd, will ask psych to reevaluate.   8.  Anemia of chronic disease.  Monitor.  9.  Moderate protein calorie malnutrition.  On protein supplements.  10. GERD.  On Pepcid.  DVT prophylaxis: lovenox Code Status: DNR Family Communication: daughter over phone Disposition:   Status is: Inpatient  Remains inpatient appropriate because:Inpatient level of care appropriate due to severity of illness   Dispo: The patient is from: Home              Anticipated d/c is to: pending              Anticipated d/c date is: > 3 days              Patient currently is not medically stable to d/c.   Difficult to place patient No       Consultants:   Cardiology   psych  Procedures:  Echo IMPRESSIONS    1. Severe concentric LVH with cavity obliteration of mid cavity and apex  with systole consistent with hypertrophic cardiomyopathy Turbulent flow  through LV/LVOT but no significant obstruction at rest. . Left ventricular  ejection fraction, by estimation,  is 70 to 75%. The left ventricle has hyperdynamic function. The left  ventricle has no regional wall motion abnormalities. Left ventricular  diastolic parameters are consistent with Grade  I diastolic dysfunction  (impaired relaxation).  2. Right ventricular systolic function is normal. The right ventricular  size is normal. There is normal pulmonary artery systolic pressure.  3. A small pericardial effusion is present.  4. The mitral valve is abnormal.  5. The inferior vena cava is normal in size with greater than 50%  respiratory variability, suggesting right atrial pressure of 3 mmHg.   Antimicrobials:  Anti-infectives (From admission, onward)   None        Subjective: Confused, pleasantly   Objective: Vitals:   03/17/20 0745 03/17/20 0800 03/17/20 1642 03/17/20 1700  BP:   133/62   Pulse:  77 70   Resp:   16   Temp:   97.9 F (36.6 C)   TempSrc:   Oral   SpO2: 96% 100% 90% 93%  Weight:      Height:        Intake/Output Summary (Last 24 hours) at 03/17/2020 1849 Last data filed at 03/17/2020 1700 Gross per 24 hour  Intake 480 ml  Output 1 ml  Net 479 ml   Filed Weights   02/26/20 1100  Weight: 48 kg    Examination:  General exam: Appears calm and comfortable. Sitting up in chair, dressed. Respiratory system: Clear to auscultation. Respiratory effort normal. Cardiovascular system: S1 & S2 heard, RRR Gastrointestinal system: Abdomen is nondistended, soft and nontender. Central nervous system: Pleasantly confused  Extremities: no LEE Skin: No rashes, lesions or ulcers Psychiatry: Judgement and insight appear normal. Mood & affect appropriate.     Data Reviewed: I have personally reviewed following labs and imaging studies  CBC: Recent Labs  Lab 03/14/20 0331  WBC 6.6  HGB 11.6*  HCT 35.3*  MCV 91.9  PLT 211    Basic Metabolic Panel: Recent Labs  Lab 03/13/20 0151 03/14/20 0331 03/15/20 0403 03/16/20 0245 03/17/20 0350  NA 140 138 138 135 131*  K 4.1 4.2 4.0 3.9 3.6  CL 93* 95* 94* 92* 87*  CO2 35* 34* 37* 30 32  GLUCOSE 101* 87 92 103* 140*  BUN 38* 36* 27* 27* 29*  CREATININE 1.24* 1.15* 1.24* 0.99 1.04*  CALCIUM  8.8* 8.8* 8.5* 8.8* 8.5*  MG 2.0 2.0 2.0 1.7  --     GFR: Estimated Creatinine Clearance: 33.2 mL/min (A) (by C-G formula based on SCr of 1.04 mg/dL (H)).  Liver Function Tests: No results for input(s): AST, ALT, ALKPHOS, BILITOT, PROT, ALBUMIN in the last 168 hours.  CBG: No results for input(s): GLUCAP in the last 168 hours.   No results found for this or any previous visit (from the past 240 hour(s)).       Radiology Studies: No results found.      Scheduled Meds: . (feeding supplement) PROSource Plus  30 mL Oral BID BM  . enoxaparin (LOVENOX) injection  30 mg Subcutaneous Q24H  . famotidine  20 mg Oral Daily  . feeding supplement  237 mL Oral TID BM  . metoprolol tartrate  50 mg Oral BID  . mometasone-formoterol  2  puff Inhalation BID  . multivitamin with minerals  1 tablet Oral Daily  . nicotine  21 mg Transdermal Daily  . polyethylene glycol  17 g Oral BID  . QUEtiapine  25 mg Oral BID  . senna-docusate  1 tablet Oral BID   Continuous Infusions:   LOS: 20 days    Time spent: over 30 min    Lacretia Nicks, MD [facility]   To contact the attending provider between 7A-7P or the covering provider during after hours 7P-7A, please log into the web site [email or web address] and access using universal [place] password for that web site. If you do not have the password, please call the hospital operator.  03/17/2020, 6:49 PM

## 2020-03-18 ENCOUNTER — Inpatient Hospital Stay (HOSPITAL_COMMUNITY): Payer: Medicare HMO

## 2020-03-18 DIAGNOSIS — M7989 Other specified soft tissue disorders: Secondary | ICD-10-CM | POA: Diagnosis not present

## 2020-03-18 LAB — COMPREHENSIVE METABOLIC PANEL
ALT: 34 U/L (ref 0–44)
AST: 30 U/L (ref 15–41)
Albumin: 2.5 g/dL — ABNORMAL LOW (ref 3.5–5.0)
Alkaline Phosphatase: 60 U/L (ref 38–126)
Anion gap: 12 (ref 5–15)
BUN: 33 mg/dL — ABNORMAL HIGH (ref 8–23)
CO2: 34 mmol/L — ABNORMAL HIGH (ref 22–32)
Calcium: 8.6 mg/dL — ABNORMAL LOW (ref 8.9–10.3)
Chloride: 89 mmol/L — ABNORMAL LOW (ref 98–111)
Creatinine, Ser: 0.89 mg/dL (ref 0.44–1.00)
GFR, Estimated: >60 mL/min (ref >60)
Glucose, Bld: 110 mg/dL — ABNORMAL HIGH (ref 70–99)
Potassium: 3.5 mmol/L (ref 3.5–5.1)
Sodium: 135 mmol/L (ref 135–145)
Total Bilirubin: 0.5 mg/dL (ref 0.3–1.2)
Total Protein: 5.9 g/dL — ABNORMAL LOW (ref 6.5–8.1)

## 2020-03-18 LAB — MAGNESIUM: Magnesium: 1.7 mg/dL (ref 1.7–2.4)

## 2020-03-18 LAB — CBC WITH DIFFERENTIAL/PLATELET
Abs Immature Granulocytes: 0.06 10*3/uL (ref 0.00–0.07)
Basophils Absolute: 0 10*3/uL (ref 0.0–0.1)
Basophils Relative: 0 %
Eosinophils Absolute: 0.1 10*3/uL (ref 0.0–0.5)
Eosinophils Relative: 1 %
HCT: 34.3 % — ABNORMAL LOW (ref 36.0–46.0)
Hemoglobin: 11 g/dL — ABNORMAL LOW (ref 12.0–15.0)
Immature Granulocytes: 1 %
Lymphocytes Relative: 4 %
Lymphs Abs: 0.4 10*3/uL — ABNORMAL LOW (ref 0.7–4.0)
MCH: 28.8 pg (ref 26.0–34.0)
MCHC: 32.1 g/dL (ref 30.0–36.0)
MCV: 89.8 fL (ref 80.0–100.0)
Monocytes Absolute: 0.6 10*3/uL (ref 0.1–1.0)
Monocytes Relative: 6 %
Neutro Abs: 8.7 10*3/uL — ABNORMAL HIGH (ref 1.7–7.7)
Neutrophils Relative %: 88 %
Platelets: 216 10*3/uL (ref 150–400)
RBC: 3.82 MIL/uL — ABNORMAL LOW (ref 3.87–5.11)
RDW: 16.8 % — ABNORMAL HIGH (ref 11.5–15.5)
WBC: 9.9 10*3/uL (ref 4.0–10.5)
nRBC: 0 % (ref 0.0–0.2)

## 2020-03-18 LAB — GLUCOSE, CAPILLARY: Glucose-Capillary: 119 mg/dL — ABNORMAL HIGH (ref 70–99)

## 2020-03-18 LAB — BRAIN NATRIURETIC PEPTIDE: B Natriuretic Peptide: 555.6 pg/mL — ABNORMAL HIGH (ref 0.0–100.0)

## 2020-03-18 LAB — PHOSPHORUS: Phosphorus: 2.6 mg/dL (ref 2.5–4.6)

## 2020-03-18 NOTE — Plan of Care (Signed)
  Problem: Education: Goal: Knowledge of General Education information will improve Description: Including pain rating scale, medication(s)/side effects and non-pharmacologic comfort measures Outcome: Progressing   Problem: Clinical Measurements: Goal: Ability to maintain clinical measurements within normal limits will improve Outcome: Progressing Goal: Will remain free from infection Outcome: Progressing Goal: Diagnostic test results will improve Outcome: Progressing Goal: Respiratory complications will improve Outcome: Progressing Goal: Cardiovascular complication will be avoided Outcome: Progressing   Problem: Health Behavior/Discharge Planning: Goal: Ability to manage health-related needs will improve Outcome: Progressing   Problem: Activity: Goal: Risk for activity intolerance will decrease Outcome: Progressing   Problem: Nutrition: Goal: Adequate nutrition will be maintained Outcome: Progressing   Problem: Coping: Goal: Level of anxiety will decrease Outcome: Progressing   Problem: Elimination: Goal: Will not experience complications related to bowel motility Outcome: Progressing Goal: Will not experience complications related to urinary retention Outcome: Progressing   Problem: Pain Managment: Goal: General experience of comfort will improve Outcome: Progressing   Problem: Safety: Goal: Ability to remain free from injury will improve Outcome: Progressing   Problem: Skin Integrity: Goal: Risk for impaired skin integrity will decrease Outcome: Progressing   Problem: Education: Goal: Knowledge of risk factors and measures for prevention of condition will improve Outcome: Progressing   Problem: Coping: Goal: Psychosocial and spiritual needs will be supported Outcome: Progressing   Problem: Respiratory: Goal: Will maintain a patent airway Outcome: Progressing Goal: Complications related to the disease process, condition or treatment will be avoided or  minimized Outcome: Progressing

## 2020-03-18 NOTE — Progress Notes (Signed)
Bilateral lower extremity venous study completed.      Please see CV Proc for preliminary results.   Akia Montalban, RVT  
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
## 2020-03-18 NOTE — Progress Notes (Addendum)
PROGRESS NOTE    Patricia CiscoBarbara Terrell  UJW:[id number]:[id number] DOB: 06/10/1940 DOA: 02/24/2020 PCP: Pcp, No   Chief Complaint  Patient presents with  . Psychiatric Evaluation  . Altered Mental Status    Brief Narrative:  80 year old African-American female with past medical history significant for but not limited to dementia, COPD/emphysema, hypertension as well as other comorbidities was admitted from 02/13/20-02/16/20 for COPD exacerbation and was found to be Covid positive at that time, she was treated with 3 days of remdesivir and discharged home, she came back to the hospital on 02/26/2020 with shortness of breath.  This time she was diagnosed with worsening COVID-19 pneumonia along with acute on chronic diastolic CHF and admitted to the hospital.  She developed worsening encephalopathy and became aggressive, psych was consulted and currently she has been IVC to be placed to inpatient psych facility.  Of note patient has moved from [place] with her daughter 1 month ago, according to the daughter patient was having some memory issues for a while at [place] she is unaware of having any behavioral issues there.  Assessment & Plan:   Principal Problem:   Dementia with behavioral disturbance (HCC) Active Problems:   COPD (chronic obstructive pulmonary disease) (HCC)   HTN (hypertension)   Chronic diastolic CHF (congestive heart failure) (HCC)   Acute respiratory failure with hypoxia (HCC)   Acute hypoxemic respiratory failure (HCC)   Protein-calorie malnutrition, severe  1. Acute Hypoxic Resp. Failure due to Acute Covid 19 Viral Pneumonitis lung with acute on chronic diastolic CHF EF 75% on recent echocardiogram  1. Completed covid specific treatment 2. S/p lasix 3. Last CXR 2/14 with calcified granulomas, mild R base atelectasis 4. Her initial Covid positive date is 02/13/2020, she's completed her quarantine. 5. o2 needs increased today, follow CXR -> granulomatous disease, bibasilar atelectasis,  possible pneumonia in R base 6. Follow repeat CXR 2/25 am, hold abx for now in absence of fever, leukocytosis 7. Wean o2 as tolerated  2.  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  - cardiology c/s - recommended low dose beta blocker as tolerated.  Avoid overdiuresis.  Avoid Imdur or Norvasc.   3.  Hyperkalemia.  Treated on 03/10/2020.  Stopped steroids.  4.  CKD 3A.  Creatinine at or better than baseline.  5.  Smoking.  Counseled to quit, currently on NicoDerm patch.  6.  Hypertension.  Blood pressure stable on beta-blocker, as needed Lasix, monitor and adjust.  7.  Dementia with metabolic encephalopathy.  Seen by psych this admission, currently on Seroquel along with as needed Haldol. IVC in place, awaits inpatient psych placement.   Psych reevaluated today - they now recommend follow up with neuropsych or demential specialist - continue seroquel 25 mg BID  8.  Anemia of chronic disease.  Monitor.  9.  Moderate protein calorie malnutrition.  On protein supplements.  10. GERD.  On Pepcid.   # LE edema: negative for DVT - follow BNP  DVT prophylaxis: lovenox Code Status: DNR Family Communication: daughter over phone 2/24 Disposition:   Status is: Inpatient  Remains inpatient appropriate because:Inpatient level of care appropriate due to severity of illness   Dispo: The patient is from: Home              Anticipated d/c is to: pending              Anticipated d/c date is: >  3 days              Patient currently is not medically stable to d/c.   Difficult to place patient No       Consultants:   Cardiology   psych  Procedures:  Echo IMPRESSIONS    1. Severe concentric LVH with cavity obliteration of mid cavity and apex  with systole consistent with hypertrophic cardiomyopathy Turbulent flow  through LV/LVOT but no significant obstruction at rest. .  Left ventricular  ejection fraction, by estimation,  is 70 to 75%. The left ventricle has hyperdynamic function. The left  ventricle has no regional wall motion abnormalities. Left ventricular  diastolic parameters are consistent with Grade I diastolic dysfunction  (impaired relaxation).  2. Right ventricular systolic function is normal. The right ventricular  size is normal. There is normal pulmonary artery systolic pressure.  3. A small pericardial effusion is present.  4. The mitral valve is abnormal.  5. The inferior vena cava is normal in size with greater than 50%  respiratory variability, suggesting right atrial pressure of 3 mmHg.   Antimicrobials:  Anti-infectives (From admission, onward)   None        Subjective: Pleasantly ocnfused, no new complaints  Objective: Vitals:   03/18/20 0504 03/18/20 0948 03/18/20 1023 03/18/20 1300  BP: 132/66  (!) 147/62 124/70  Pulse: 81  75 (!) 57  Resp: 18  17 18   Temp: 98.2 F (36.8 C)   97.9 F (36.6 C)  TempSrc: Oral   Oral  SpO2: 95% (!) 89% 95% 97%  Weight:      Height:       No intake or output data in the 24 hours ending 03/18/20 1713 Filed Weights   02/26/20 1100  Weight: 48 kg    Examination:  General: lying in bed, confused Cardiovascular: Heart sounds show a regular rate, and rhythm. Lungs: Clear to auscultation bilaterally  Abdomen: Soft, nontender, nondistended  Neurological: Alert, pleasantly confused. Moves all extremities 4 with equal strength. Cranial nerves II through XII grossly intact. Skin: Warm and dry. No rashes or lesions. Extremities: R>L lee   Data Reviewed: I have personally reviewed following labs and imaging studies  CBC: Recent Labs  Lab 03/14/20 0331 03/18/20 0238  WBC 6.6 9.9  NEUTROABS  --  8.7*  HGB 11.6* 11.0*  HCT 35.3* 34.3*  MCV 91.9 89.8  PLT 211 216    Basic Metabolic Panel: Recent Labs  Lab 03/13/20 0151 03/14/20 0331 03/15/20 0403 03/16/20 0245  03/17/20 0350 03/18/20 0238  NA 140 138 138 135 131* 135  K 4.1 4.2 4.0 3.9 3.6 3.5  CL 93* 95* 94* 92* 87* 89*  CO2 35* 34* 37* 30 32 34*  GLUCOSE 101* 87 92 103* 140* 110*  BUN 38* 36* 27* 27* 29* 33*  CREATININE 1.24* 1.15* 1.24* 0.99 1.04* 0.89  CALCIUM 8.8* 8.8* 8.5* 8.8* 8.5* 8.6*  MG 2.0 2.0 2.0 1.7  --  1.7  PHOS  --   --   --   --   --  2.6    GFR: Estimated Creatinine Clearance: 38.8 mL/min (by C-G formula based on SCr of 0.89 mg/dL).  Liver Function Tests: Recent Labs  Lab 03/18/20 0238  AST 30  ALT 34  ALKPHOS 60  BILITOT 0.5  PROT 5.9*  ALBUMIN 2.5*    CBG: No results for input(s): GLUCAP in the last 168 hours.   No results found for this or any previous visit (from  the past 240 hour(s)).       Radiology Studies: DG CHEST PORT 1 VIEW  Result Date: 03/18/2020 CLINICAL DATA:  Shortness of breath EXAM: PORTABLE CHEST 1 VIEW COMPARISON:  March 08, 2020 FINDINGS: Multiple granulomas are noted throughout the lungs bilaterally. There is bibasilar atelectatic change. Heart is enlarged with pulmonary vascularity within normal limits. There is aortic atherosclerosis. No adenopathy evident. No bone lesions. IMPRESSION: Extensive granulomatous disease. Bibasilar atelectasis. Earliest changes of pneumonia potentially present in the right base. Stable cardiomegaly. Aortic Atherosclerosis (ICD10-I70.0). Electronically Signed   By: Bretta Bang III M.D.   On: 03/18/2020 15:58   VAS [place] LOWER EXTREMITY VENOUS (DVT)  Result Date: 03/18/2020  Lower Venous DVT Study Indications: Swelling.  Risk Factors: None identified. Comparison Study: No previous Performing Technologist: Clint Guy RVT  Examination Guidelines: A complete evaluation includes B-mode imaging, spectral Doppler, color Doppler, and power Doppler as needed of all accessible portions of each vessel. Bilateral testing is considered an integral part of a complete examination. Limited examinations for  reoccurring indications may be performed as noted. The reflux portion of the exam is performed with the patient in reverse Trendelenburg.  +---------+---------------+---------+-----------+----------+--------------+ RIGHT    CompressibilityPhasicitySpontaneityPropertiesThrombus Aging +---------+---------------+---------+-----------+----------+--------------+ CFV      Full           Yes      Yes                                 +---------+---------------+---------+-----------+----------+--------------+ SFJ      Full                                                        +---------+---------------+---------+-----------+----------+--------------+ FV Prox  Full                                                        +---------+---------------+---------+-----------+----------+--------------+ FV Mid   Full                                                        +---------+---------------+---------+-----------+----------+--------------+ FV DistalFull                                                        +---------+---------------+---------+-----------+----------+--------------+ PFV      Full                                                        +---------+---------------+---------+-----------+----------+--------------+ POP      Full           Yes      Yes                                 +---------+---------------+---------+-----------+----------+--------------+  PTV      Full                                                        +---------+---------------+---------+-----------+----------+--------------+ PERO     Full                                                        +---------+---------------+---------+-----------+----------+--------------+   +---------+---------------+---------+-----------+----------+--------------+ LEFT     CompressibilityPhasicitySpontaneityPropertiesThrombus Aging  +---------+---------------+---------+-----------+----------+--------------+ CFV      Full           Yes      Yes                                 +---------+---------------+---------+-----------+----------+--------------+ SFJ      Full                                                        +---------+---------------+---------+-----------+----------+--------------+ FV Prox  Full                                                        +---------+---------------+---------+-----------+----------+--------------+ FV Mid   Full                                                        +---------+---------------+---------+-----------+----------+--------------+ FV DistalFull                                                        +---------+---------------+---------+-----------+----------+--------------+ PFV      Full                                                        +---------+---------------+---------+-----------+----------+--------------+ POP      Full           Yes      Yes                                 +---------+---------------+---------+-----------+----------+--------------+ PTV      Full                                                        +---------+---------------+---------+-----------+----------+--------------+  PERO     Full                                                        +---------+---------------+---------+-----------+----------+--------------+     Summary: BILATERAL: - No evidence of deep vein thrombosis seen in the lower extremities, bilaterally. -No evidence of popliteal cyst, bilaterally.   *See table(s) above for measurements and observations.    Preliminary         Scheduled Meds: . (feeding supplement) PROSource Plus  30 mL Oral BID BM  . enoxaparin (LOVENOX) injection  30 mg Subcutaneous Q24H  . famotidine  20 mg Oral Daily  . feeding supplement  237 mL Oral TID BM  . metoprolol tartrate  50 mg Oral BID  .  mometasone-formoterol  2 puff Inhalation BID  . multivitamin with minerals  1 tablet Oral Daily  . nicotine  21 mg Transdermal Daily  . polyethylene glycol  17 g Oral BID  . QUEtiapine  25 mg Oral BID  . senna-docusate  1 tablet Oral BID   Continuous Infusions:   LOS: 21 days    Time spent: over 30 min    Lacretia Nicks, MD [facility]   To contact the attending provider between 7A-7P or the covering provider during after hours 7P-7A, please log into the web site [email or web address] and access using universal [place] password for that web site. If you do not have the password, please call the hospital operator.  03/18/2020, 5:13 PM
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
## 2020-03-19 ENCOUNTER — Inpatient Hospital Stay (HOSPITAL_COMMUNITY): Payer: Medicare HMO

## 2020-03-19 DIAGNOSIS — J1282 Pneumonia due to coronavirus disease 2019: Principal | ICD-10-CM

## 2020-03-19 LAB — COMPREHENSIVE METABOLIC PANEL
ALT: 32 U/L (ref 0–44)
AST: 30 U/L (ref 15–41)
Albumin: 2.4 g/dL — ABNORMAL LOW (ref 3.5–5.0)
Alkaline Phosphatase: 86 U/L (ref 38–126)
Anion gap: 10 (ref 5–15)
BUN: 19 mg/dL (ref 8–23)
CO2: 35 mmol/L — ABNORMAL HIGH (ref 22–32)
Calcium: 8.6 mg/dL — ABNORMAL LOW (ref 8.9–10.3)
Chloride: 88 mmol/L — ABNORMAL LOW (ref 98–111)
Creatinine, Ser: 0.93 mg/dL (ref 0.44–1.00)
GFR, Estimated: >60 mL/min (ref >60)
Glucose, Bld: 160 mg/dL — ABNORMAL HIGH (ref 70–99)
Potassium: 3.5 mmol/L (ref 3.5–5.1)
Sodium: 133 mmol/L — ABNORMAL LOW (ref 135–145)
Total Bilirubin: 0.7 mg/dL (ref 0.3–1.2)
Total Protein: 6.2 g/dL — ABNORMAL LOW (ref 6.5–8.1)

## 2020-03-19 LAB — CBC WITH DIFFERENTIAL/PLATELET
Abs Immature Granulocytes: 0.05 10*3/uL (ref 0.00–0.07)
Basophils Absolute: 0 10*3/uL (ref 0.0–0.1)
Basophils Relative: 0 %
Eosinophils Absolute: 0.1 10*3/uL (ref 0.0–0.5)
Eosinophils Relative: 1 %
HCT: 34.2 % — ABNORMAL LOW (ref 36.0–46.0)
Hemoglobin: 10.8 g/dL — ABNORMAL LOW (ref 12.0–15.0)
Immature Granulocytes: 1 %
Lymphocytes Relative: 3 %
Lymphs Abs: 0.3 10*3/uL — ABNORMAL LOW (ref 0.7–4.0)
MCH: 29.1 pg (ref 26.0–34.0)
MCHC: 31.6 g/dL (ref 30.0–36.0)
MCV: 92.2 fL (ref 80.0–100.0)
Monocytes Absolute: 0.5 10*3/uL (ref 0.1–1.0)
Monocytes Relative: 5 %
Neutro Abs: 8.3 10*3/uL — ABNORMAL HIGH (ref 1.7–7.7)
Neutrophils Relative %: 90 %
Platelets: 225 10*3/uL (ref 150–400)
RBC: 3.71 MIL/uL — ABNORMAL LOW (ref 3.87–5.11)
RDW: 16.7 % — ABNORMAL HIGH (ref 11.5–15.5)
WBC: 9.2 10*3/uL (ref 4.0–10.5)
nRBC: 0 % (ref 0.0–0.2)

## 2020-03-19 LAB — MAGNESIUM: Magnesium: 1.9 mg/dL (ref 1.7–2.4)

## 2020-03-19 LAB — BRAIN NATRIURETIC PEPTIDE: B Natriuretic Peptide: 333.9 pg/mL — ABNORMAL HIGH (ref 0.0–100.0)

## 2020-03-19 MED ORDER — IOHEXOL 350 MG/ML SOLN
100.0000 mL | Freq: Once | INTRAVENOUS | Status: AC | PRN
  Administered 2020-03-19: 58 mL via INTRAVENOUS

## 2020-03-19 MED ORDER — CEFTRIAXONE SODIUM 2 G IJ SOLR
2.0000 g | INTRAMUSCULAR | Status: DC
  Administered 2020-03-19 – 2020-03-22 (×4): 2 g via INTRAVENOUS
  Filled 2020-03-19: qty 2
  Filled 2020-03-19: qty 20
  Filled 2020-03-19 (×3): qty 2

## 2020-03-19 MED ORDER — AZITHROMYCIN 500 MG IV SOLR
500.0000 mg | INTRAVENOUS | Status: DC
  Administered 2020-03-19 – 2020-03-20 (×2): 500 mg via INTRAVENOUS
  Filled 2020-03-19 (×3): qty 500

## 2020-03-19 MED ORDER — FUROSEMIDE 10 MG/ML IJ SOLN
40.0000 mg | Freq: Once | INTRAMUSCULAR | Status: AC
  Administered 2020-03-19: 40 mg via INTRAVENOUS
  Filled 2020-03-19: qty 4

## 2020-03-19 NOTE — Plan of Care (Signed)

## 2020-03-19 NOTE — Progress Notes (Signed)
PROGRESS NOTE    Patricia Terrell  NUU:[id number] DOB: 05/11/1940 DOA: 02/24/2020 PCP: Patricia Terrell   Chief Complaint  Patient presents with  . Psychiatric Evaluation  . Altered Mental Status    Brief Narrative:  80 year old African-American female with past medical history significant for but not limited to dementia, COPD/emphysema, hypertension as well as other comorbidities was admitted from 02/13/20-02/16/20 for COPD exacerbation and was found to be Covid positive at that time, she was treated with 3 days of remdesivir and discharged home, she came back to the hospital on 02/26/2020 with shortness of breath.  This time she was diagnosed with worsening COVID-19 pneumonia along with acute on chronic diastolic CHF and admitted to the hospital.  She developed worsening encephalopathy and became aggressive, psych was consulted and currently she has been IVC to be placed to inpatient psych facility.  Of note patient has moved from [place] with her daughter 1 month ago, according to the daughter patient was having some memory issues for Patricia Terrell while at [place] she is unaware of having any behavioral issues there.  Assessment & Plan:   Principal Problem:   Dementia with behavioral disturbance (HCC) Active Problems:   COPD (chronic obstructive pulmonary disease) (HCC)   HTN (hypertension)   Chronic diastolic CHF (congestive heart failure) (HCC)   Acute respiratory failure with hypoxia (HCC)   Acute hypoxemic respiratory failure (HCC)   Protein-calorie malnutrition, severe  1. Acute Hypoxic Resp. Failure due to Acute Covid 19 Viral Pneumonitis lung with acute on chronic diastolic CHF EF 75% on recent echocardiogram  Pneumonia 1. Completed covid specific treatment 2. S/p lasix 3. Last CXR 2/14 with calcified granulomas, mild R base atelectasis 4. Her initial Covid positive date is 02/13/2020, she's completed her quarantine. 5. Persistent increased O2 needs today -> CT PE protocol with large right  lower lobe airspace opacity concerning for pneumonia (see report) 6. Ceftriaxone/azithro - mrsa pcr, sputum, urine strep, urine legionella - afebrile, normal WBC count broaden as needed 7. SLP eval  2.  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  - cardiology c/s - recommended low dose beta blocker as tolerated.  Avoid overdiuresis (lasix x1).  Avoid Imdur or Norvasc.   3.  Hyperkalemia.  Treated on 03/10/2020.  Stopped steroids.  4.  CKD 3A.  Creatinine at or better than baseline.  5.  Smoking.  Counseled to quit, currently on NicoDerm patch.  6.  Hypertension.  Blood pressure stable on beta-blocker, as needed Lasix, monitor and adjust.  7.  Dementia with metabolic encephalopathy.  Seen by psych this admission, currently on Seroquel along with as needed Haldol. Psych reevaluated - they now recommend follow up with neuropsych or demential specialist - continue seroquel 25 mg BID  8.  Anemia of chronic disease.  Monitor.  9.  Moderate protein calorie malnutrition.  On protein supplements.  10. GERD.  On Pepcid.   # LE edema: negative for DVT - follow BNP (improving)  DVT prophylaxis: lovenox Code Status: DNR Family Communication: daughter over phone 2/24 Disposition:   Status is: Inpatient  Remains inpatient appropriate because:Inpatient level of care appropriate due to severity of illness   Dispo: The patient is from: Home              Anticipated d/c is to: pending              Anticipated d/c date is: >  3 days              Patient currently is not medically stable to d/c.   Difficult to place patient Terrell       Consultants:   Cardiology   psych  Procedures:  Echo IMPRESSIONS    1. Severe concentric LVH with cavity obliteration of mid cavity and apex  with systole consistent with hypertrophic cardiomyopathy Turbulent flow  through LV/LVOT but Terrell  significant obstruction at rest. . Left ventricular  ejection fraction, by estimation,  is 70 to 75%. The left ventricle has hyperdynamic function. The left  ventricle has Terrell regional wall motion abnormalities. Left ventricular  diastolic parameters are consistent with Grade I diastolic dysfunction  (impaired relaxation).  2. Right ventricular systolic function is normal. The right ventricular  size is normal. There is normal pulmonary artery systolic pressure.  3. Patricia Terrell small pericardial effusion is present.  4. The mitral valve is abnormal.  5. The inferior vena cava is normal in size with greater than 50%  respiratory variability, suggesting right atrial pressure of 3 mmHg.   Antimicrobials:  Anti-infectives (From admission, onward)   Start     Dose/Rate Route Frequency Ordered Stop   03/19/20 2030  cefTRIAXone (ROCEPHIN) 2 g in sodium chloride 0.9 % 100 mL IVPB        2 g 200 mL/hr over 30 Minutes Intravenous Every 24 hours 03/19/20 1932 03/24/20 2029   03/19/20 2030  azithromycin (ZITHROMAX) 500 mg in sodium chloride 0.9 % 250 mL IVPB        500 mg 250 mL/hr over 60 Minutes Intravenous Every 24 hours 03/19/20 1932 03/24/20 2029        Subjective: Terrell new complaints today  Objective: Vitals:   03/18/20 2208 03/19/20 0616 03/19/20 0835 03/19/20 1209  BP: (!) 180/95 (!) 140/59 (!) 180/88 (!) 145/52  Pulse: 71 60 65 65  Resp: 20 17  18   Temp: 98.3 F (36.8 C) 98.2 F (36.8 C) 98 F (36.7 C) 98 F (36.7 C)  TempSrc: Oral  Oral Oral  SpO2: 91% 100% 96% 91%  Weight:      Height:       Terrell intake or output data in the 24 hours ending 03/19/20 1932 Filed Weights   02/26/20 1100  Weight: 48 kg    Examination:  General: Terrell acute distress. Cardiovascular: Heart sounds show Patricia Terrell regular rate, and rhythm Lungs: Clear to auscultation bilaterally . Abdomen: Soft, nontender, nondistended  Neurological: Alert. Moves all extremities 4. Cranial nerves II through XII grossly  intact. Skin: Warm and dry. Terrell rashes or lesions. Extremities: Terrell clubbing or cyanosis. Bilateral LE edema.  Data Reviewed: I have personally reviewed following labs and imaging studies  CBC: Recent Labs  Lab 03/14/20 0331 03/18/20 0238 03/19/20 0253  WBC 6.6 9.9 9.2  NEUTROABS  --  8.7* 8.3*  HGB 11.6* 11.0* 10.8*  HCT 35.3* 34.3* 34.2*  MCV 91.9 89.8 92.2  PLT 211 216 225    Basic Metabolic Panel: Recent Labs  Lab 03/14/20 0331 03/15/20 0403 03/16/20 0245 03/17/20 0350 03/18/20 0238 03/19/20 0253  NA 138 138 135 131* 135 133*  K 4.2 4.0 3.9 3.6 3.5 3.5  CL 95* 94* 92* 87* 89* 88*  CO2 34* 37* 30 32 34* 35*  GLUCOSE 87 92 103* 140* 110* 160*  BUN 36* 27* 27* 29* 33* 19  CREATININE 1.15* 1.24* 0.99 1.04* 0.89 0.93  CALCIUM 8.8* 8.5* 8.8* 8.5* 8.6* 8.6*  MG 2.0 2.0 1.7  --  1.7 1.9  PHOS  --   --   --   --  2.6  --     GFR: Estimated Creatinine Clearance: 37.2 mL/min (by C-G formula based on SCr of 0.93 mg/dL).  Liver Function Tests: Recent Labs  Lab 03/18/20 0238 03/19/20 0253  AST 30 30  ALT 34 32  ALKPHOS 60 86  BILITOT 0.5 0.7  PROT 5.9* 6.2*  ALBUMIN 2.5* 2.4*    CBG: Recent Labs  Lab 03/18/20 2159  GLUCAP 119*     Terrell results found for this or any previous visit (from the past 240 hour(s)).       Radiology Studies: CT ANGIO CHEST PE W OR WO CONTRAST  Result Date: 03/19/2020 CLINICAL DATA:  Positive D-dimer level. EXAM: CT ANGIOGRAPHY CHEST WITH CONTRAST TECHNIQUE: Multidetector CT imaging of the chest was performed using the standard protocol during bolus administration of intravenous contrast. Multiplanar CT image reconstructions and MIPs were obtained to evaluate the vascular anatomy. CONTRAST:  9mL OMNIPAQUE IOHEXOL 350 MG/ML SOLN COMPARISON:  February 13, 2020. FINDINGS: Cardiovascular: Satisfactory opacification of the pulmonary arteries to the segmental level. Terrell evidence of pulmonary embolism. Mild cardiomegaly is noted. Terrell  pericardial effusion. Atherosclerosis of thoracic aorta is noted without aneurysm formation. Enlargement of pulmonary arteries is noted suggesting pulmonary artery hypertension. Mediastinum/Nodes: Terrell enlarged mediastinal, hilar, or axillary lymph nodes. Thyroid gland, trachea, and esophagus demonstrate Terrell significant findings. Lungs/Pleura: Terrell pneumothorax is noted. Emphysematous disease is noted in both lungs. Large right lower lobe airspace opacity is noted concerning for pneumonia. Minimal bilateral pleural effusions are noted with adjacent subsegmental atelectasis. 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 Upper Abdomen: Terrell acute abnormality. Musculoskeletal: Terrell chest wall abnormality. Terrell acute or significant osseous findings. Review of the MIP images confirms the above findings. IMPRESSION: 1. Terrell definite evidence of pulmonary embolus. 2. Enlargement of pulmonary arteries is noted suggesting pulmonary artery hypertension. 3. Large right lower lobe airspace opacity is noted concerning for pneumonia. 4. Minimal bilateral pleural effusions are noted with adjacent subsegmental atelectasis. 5. 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 6. Emphysema and aortic atherosclerosis. Aortic Atherosclerosis (ICD10-I70.0) and Emphysema (ICD10-J43.9). Electronically Signed   By: Lupita Raider M.D.   On: 03/19/2020 18:14   DG CHEST PORT 1 VIEW  Result Date: 03/19/2020 CLINICAL DATA:  Shortness of breath EXAM: PORTABLE CHEST 1 VIEW COMPARISON:  March 18, 2020 FINDINGS: Multiple granulomas bilaterally are again noted. Bibasilar atelectasis appears stable. Terrell edema or consolidation. Areas of pleural calcification noted. There is cardiomegaly with pulmonary vascularity normal. There is aortic atherosclerosis. Terrell bone lesions IMPRESSION: Multiple calcified granulomas as well as pleural calcifications appear stable. Bibasilar atelectasis. Terrell  appreciable new opacity. Stable cardiomegaly. Aortic Atherosclerosis (ICD10-I70.0). Electronically Signed   By: Bretta Bang III M.D.   On: 03/19/2020 08:05   DG CHEST PORT 1 VIEW  Result Date: 03/18/2020 CLINICAL DATA:  Shortness of breath EXAM: PORTABLE CHEST 1 VIEW COMPARISON:  March 08, 2020 FINDINGS: Multiple granulomas are noted throughout the lungs bilaterally. There is bibasilar atelectatic change. Heart is enlarged with pulmonary vascularity within normal limits. There is aortic atherosclerosis. Terrell adenopathy evident. Terrell bone lesions. IMPRESSION: Extensive granulomatous disease. Bibasilar atelectasis. Earliest changes of pneumonia potentially present in the right base. Stable cardiomegaly. Aortic Atherosclerosis (ICD10-I70.0). Electronically Signed   By: Bretta Bang III M.D.   On: 03/18/2020 15:58   VAS [place] LOWER EXTREMITY VENOUS (  DVT)  Result Date: 03/18/2020  Lower Venous DVT Study Indications: Swelling.  Risk Factors: None identified. Comparison Study: Terrell previous Performing Technologist: Clint Guy RVT  Examination Guidelines: Lasonja Lakins complete evaluation includes B-mode imaging, spectral Doppler, color Doppler, and power Doppler as needed of all accessible portions of each vessel. Bilateral testing is considered an integral part of Lexx[facility] [place] complete examination. Limited examinations for reoccurring indications may be performed as noted. The reflux portion of the exam is performed with the patient in reverse Trendelenburg.  +---------+---------------+---------+-----------+----------+--------------+ RIGHT    CompressibilityPhasicitySpontaneityPropertiesThrombus Aging +---------+---------------+---------+-----------+----------+--------------+ CFV      Full           Yes      Yes                                 +---------+---------------+---------+-----------+----------+--------------+ SFJ      Full                                                         +---------+---------------+---------+-----------+----------+--------------+ FV Prox  Full                                                        +---------+---------------+---------+-----------+----------+--------------+ FV Mid   Full                                                        +---------+---------------+---------+-----------+----------+--------------+ FV DistalFull                                                        +---------+---------------+---------+-----------+----------+--------------+ PFV      Full                                                        +---------+---------------+---------+-----------+----------+--------------+ POP      Full           Yes      Yes                                 +---------+---------------+---------+-----------+----------+--------------+ PTV      Full                                                        +---------+---------------+---------+-----------+----------+--------------+ PERO     Full                                                        +---------+---------------+---------+-----------+----------+--------------+   +---------+---------------+---------+-----------+----------+--------------+  LEFT     CompressibilityPhasicitySpontaneityPropertiesThrombus Aging +---------+---------------+---------+-----------+----------+--------------+ CFV      Full           Yes      Yes                                 +---------+---------------+---------+-----------+----------+--------------+ SFJ      Full                                                        +---------+---------------+---------+-----------+----------+--------------+ FV Prox  Full                                                        +---------+---------------+---------+-----------+----------+--------------+ FV Mid   Full                                                         +---------+---------------+---------+-----------+----------+--------------+ FV DistalFull                                                        +---------+---------------+---------+-----------+----------+--------------+ PFV      Full                                                        +---------+---------------+---------+-----------+----------+--------------+ POP      Full           Yes      Yes                                 +---------+---------------+---------+-----------+----------+--------------+ PTV      Full                                                        +---------+---------------+---------+-----------+----------+--------------+ PERO     Full                                                        +---------+---------------+---------+-----------+----------+--------------+     Summary: BILATERAL: - Terrell evidence of deep vein thrombosis seen in the lower extremities, bilaterally. -Terrell evidence of popliteal cyst, bilaterally.   *See table(s) above for measurements and observations. Electronically signed by Heath Lark on 03/18/2020 at 8:05:54 PM.  Final         Scheduled Meds: . (feeding supplement) PROSource Plus  30 mL Oral BID BM  . enoxaparin (LOVENOX) injection  30 mg Subcutaneous Q24H  . famotidine  20 mg Oral Daily  . feeding supplement  237 mL Oral TID BM  . metoprolol tartrate  50 mg Oral BID  . mometasone-formoterol  2 puff Inhalation BID  . multivitamin with minerals  1 tablet Oral Daily  . nicotine  21 mg Transdermal Daily  . polyethylene glycol  17 g Oral BID  . QUEtiapine  25 mg Oral BID  . senna-docusate  1 tablet Oral BID   Continuous Infusions: . azithromycin    . cefTRIAXone (ROCEPHIN)  IV       LOS: 22 days    Time spent: over 30 min    Lacretia Nicks, MD [facility]   To contact the attending provider between 7A-7P or the covering provider during after hours 7P-7A, please log into the web site  [email or web address] and access using universal [place] password for that web site. If you do not have the password, please call the hospital operator.  03/19/2020, 7:32 PM

## 2020-03-19 NOTE — Progress Notes (Signed)
PT Cancellation Note  Patient Details Name: Patricia Terrell MRN: [id number] DOB: [birth year]   Cancelled Treatment:    Reason Eval/Treat Not Completed: PT screened, no needs identified, will sign off. Pt has been performing in-room ADL tasks and hallway ambulation with supervision from NT/safety sitter. No acute PT or follow-up PT needs identified. Will sign off. Please reconsult if new needs arise.  Ina Homes, PT, DPT Acute Rehabilitation Services  Pager [phone] Office [phone]  Malachy Chamber 03/19/2020, 4:39 PM

## 2020-03-20 LAB — COMPREHENSIVE METABOLIC PANEL
ALT: 32 U/L (ref 0–44)
AST: 26 U/L (ref 15–41)
Albumin: 2.3 g/dL — ABNORMAL LOW (ref 3.5–5.0)
Alkaline Phosphatase: 96 U/L (ref 38–126)
Anion gap: 10 (ref 5–15)
BUN: 23 mg/dL (ref 8–23)
CO2: 38 mmol/L — ABNORMAL HIGH (ref 22–32)
Calcium: 8.8 mg/dL — ABNORMAL LOW (ref 8.9–10.3)
Chloride: 87 mmol/L — ABNORMAL LOW (ref 98–111)
Creatinine, Ser: 0.95 mg/dL (ref 0.44–1.00)
GFR, Estimated: >60 mL/min (ref >60)
Glucose, Bld: 132 mg/dL — ABNORMAL HIGH (ref 70–99)
Potassium: 3.2 mmol/L — ABNORMAL LOW (ref 3.5–5.1)
Sodium: 135 mmol/L (ref 135–145)
Total Bilirubin: 0.7 mg/dL (ref 0.3–1.2)
Total Protein: 6.2 g/dL — ABNORMAL LOW (ref 6.5–8.1)

## 2020-03-20 LAB — BLOOD GAS, VENOUS
Acid-Base Excess: 15.3 mmol/L — ABNORMAL HIGH (ref 0.0–2.0)
Bicarbonate: 41 mmol/L — ABNORMAL HIGH (ref 20.0–28.0)
FIO2: 36
O2 Saturation: 79 %
Patient temperature: 37
pCO2, Ven: 68.8 mmHg — ABNORMAL HIGH (ref 44.0–60.0)
pH, Ven: 7.393 (ref 7.250–7.430)

## 2020-03-20 LAB — CBC WITH DIFFERENTIAL/PLATELET
Abs Immature Granulocytes: 0.02 10*3/uL (ref 0.00–0.07)
Basophils Absolute: 0 10*3/uL (ref 0.0–0.1)
Basophils Relative: 0 %
Eosinophils Absolute: 0 10*3/uL (ref 0.0–0.5)
Eosinophils Relative: 0 %
HCT: 33.3 % — ABNORMAL LOW (ref 36.0–46.0)
Hemoglobin: 10.4 g/dL — ABNORMAL LOW (ref 12.0–15.0)
Immature Granulocytes: 0 %
Lymphocytes Relative: 4 %
Lymphs Abs: 0.2 10*3/uL — ABNORMAL LOW (ref 0.7–4.0)
MCH: 28.4 pg (ref 26.0–34.0)
MCHC: 31.2 g/dL (ref 30.0–36.0)
MCV: 91 fL (ref 80.0–100.0)
Monocytes Absolute: 0.3 10*3/uL (ref 0.1–1.0)
Monocytes Relative: 5 %
Neutro Abs: 6.1 10*3/uL (ref 1.7–7.7)
Neutrophils Relative %: 91 %
Platelets: 221 10*3/uL (ref 150–400)
RBC: 3.66 MIL/uL — ABNORMAL LOW (ref 3.87–5.11)
RDW: 16.6 % — ABNORMAL HIGH (ref 11.5–15.5)
WBC: 6.7 10*3/uL (ref 4.0–10.5)
nRBC: 0 % (ref 0.0–0.2)

## 2020-03-20 LAB — PHOSPHORUS: Phosphorus: 3 mg/dL (ref 2.5–4.6)

## 2020-03-20 LAB — STREP PNEUMONIAE URINARY ANTIGEN: Strep Pneumo Urinary Antigen: NEGATIVE

## 2020-03-20 LAB — HIV ANTIBODY (ROUTINE TESTING W REFLEX): HIV Screen 4th Generation wRfx: NONREACTIVE

## 2020-03-20 LAB — MRSA PCR SCREENING: MRSA by PCR: NEGATIVE

## 2020-03-20 LAB — MAGNESIUM: Magnesium: 1.9 mg/dL (ref 1.7–2.4)

## 2020-03-20 MED ORDER — POTASSIUM CHLORIDE CRYS ER 20 MEQ PO TBCR
40.0000 meq | EXTENDED_RELEASE_TABLET | Freq: Once | ORAL | Status: AC
  Administered 2020-03-20: 40 meq via ORAL
  Filled 2020-03-20: qty 2

## 2020-03-20 MED ORDER — ALBUTEROL SULFATE (2.5 MG/3ML) 0.083% IN NEBU: 2.5000 mg | INHALATION_SOLUTION | RESPIRATORY_TRACT | Status: DC | PRN

## 2020-03-20 NOTE — H&P (Signed)
No bipap needed at this time. Pt currently on 4 L [place] and RR 17. No resp. Distress noted. Rt will monitor.

## 2020-03-20 NOTE — Progress Notes (Addendum)
PROGRESS NOTE    Patricia Terrell  GUY:[id number] DOB: [birth year] DOA: 02/24/2020 PCP: Pcp, No   Chief Complaint  Patient presents with  . Psychiatric Evaluation  . Altered Mental Status    Brief Narrative:  80 year old African-American female with past medical history significant for but not limited to dementia, COPD/emphysema, hypertension as well as other comorbidities was admitted from 02/13/20-02/16/20 for COPD exacerbation and was found to be Covid positive at that time, she was treated with 3 days of remdesivir and discharged home, she came back to the hospital on 02/26/2020 with shortness of breath.  This time she was diagnosed with worsening COVID-19 pneumonia along with acute on chronic diastolic CHF and admitted to the hospital.  She developed worsening encephalopathy and became aggressive, psych was consulted and currently she has been IVC to be placed to inpatient psych facility.  Of note patient has moved from [place] with her daughter 1 month ago, according to the daughter patient was having some memory issues for Patricia Terrell while at [place] she is unaware of having any behavioral issues there.  Assessment & Plan:   Principal Problem:   Dementia with behavioral disturbance (HCC) Active Problems:   COPD (chronic obstructive pulmonary disease) (HCC)   HTN (hypertension)   Chronic diastolic CHF (congestive heart failure) (HCC)   Acute respiratory failure with hypoxia (HCC)   Acute hypoxemic respiratory failure (HCC)   Protein-calorie malnutrition, severe  1. Acute Hypoxic Resp. Failure due to Acute Covid 19 Viral Pneumonitis lung with acute on chronic diastolic CHF EF 75% on recent echocardiogram  Pneumonia 1. Completed covid specific treatment 2. S/p lasix 3. Last CXR 2/14 with calcified granulomas, mild R base atelectasis 4. Her initial Covid positive date is 02/13/2020, she's completed her quarantine. 5. Persistent increased O2 needs today -> CT PE protocol 2/25 with large  right lower lobe airspace opacity concerning for pneumonia (see report) 6. Ceftriaxone/azithro - mrsa pcr, sputum, urine strep, urine legionella - afebrile, normal WBC count broaden as needed 7. SLP eval  2. 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  - cardiology c/s - recommended low dose beta blocker as tolerated.  Avoid overdiuresis.  Avoid Imdur or Norvasc.   3.  Hyperkalemia  Hypokalemia.  now hypokalemic, treat, follow   4.  CKD 3A.  Creatinine at or better than baseline.  5.  Smoking.  Counseled to quit, currently on NicoDerm patch.  6.  Hypertension.  Blood pressure stable on beta-blocker, as needed Lasix, monitor and adjust.  7.  Dementia with metabolic encephalopathy.  Seen by psych this admission, currently on Seroquel along with as needed Haldol.  Alexxus Sobh&Ox1 (thinks she's in [place], doesn't know date). Psych reevaluated - they now recommend follow up with neuropsych or demential specialist - continue seroquel 25 mg BID  8.  Anemia of chronic disease.  Monitor.  9.  Moderate protein calorie malnutrition.  On protein supplements.  10. GERD.  On Pepcid.   # LE edema: negative for DVT - follow BNP (improving)  DVT prophylaxis: lovenox Code Status: DNR Family Communication: daughter over phone 2/26 Disposition:   Status is: Inpatient  Remains inpatient appropriate because:Inpatient level of care appropriate due to severity of illness   Dispo: The  patient is from: Home              Anticipated d/c is to: pending              Anticipated d/c date is: > 3 days              Patient currently is not medically stable to d/c.   Difficult to place patient No        Consultants:   Cardiology   psych  Procedures:  Echo IMPRESSIONS    1. Severe concentric LVH with cavity obliteration of mid cavity and apex  with systole consistent with hypertrophic cardiomyopathy Turbulent flow  through LV/LVOT but no significant obstruction at rest. . Left ventricular  ejection fraction, by estimation,  is 70 to 75%. The left ventricle has hyperdynamic function. The left  ventricle has no regional wall motion abnormalities. Left ventricular  diastolic parameters are consistent with Grade I diastolic dysfunction  (impaired relaxation).  2. Right ventricular systolic function is normal. The right ventricular  size is normal. There is normal pulmonary artery systolic pressure.  3. Honorio Devol small pericardial effusion is present.  4. The mitral valve is abnormal.  5. The inferior vena cava is normal in size with greater than 50%  respiratory variability, suggesting right atrial pressure of 3 mmHg.   Antimicrobials:  Anti-infectives (From admission, onward)   Start     Dose/Rate Route Frequency Ordered Stop   03/19/20 2100  azithromycin (ZITHROMAX) 500 mg in sodium chloride 0.9 % 250 mL IVPB        500 mg 250 mL/hr over 60 Minutes Intravenous Every 24 hours 03/19/20 1932 03/24/20 2059   03/19/20 2030  cefTRIAXone (ROCEPHIN) 2 g in sodium chloride 0.9 % 100 mL IVPB        2 g 200 mL/hr over 30 Minutes Intravenous Every 24 hours 03/19/20 1932 03/24/20 2029        Subjective: No new complaints, pleasantly confused Surprised and concerned she has pneumonia  Objective: Vitals:   03/19/20 2023 03/19/20 2035 03/19/20 2037 03/20/20 0840  BP:  (!) 151/74 (!) 151/74   Pulse:  88 88 60  Resp:  20 20 17   Temp:  98.6 F (37 C) 98.6 F (37 C)   TempSrc:   Oral   SpO2: 94% (!) 56%    Weight:      Height:       No intake or output data in the 24 hours ending 03/20/20 0929 Filed Weights   02/26/20 1100  Weight: 48 kg    Examination:  General: No acute  distress. More awake today, awakens more  Cardiovascular: Heart sounds show Elizah Mierzwa regular rate, and rhythm Lungs: crackles to R base Abdomen: Soft, nontender, nondistended Neurological: Alert and oriented 1. Moves all extremities 4 . Cranial nerves II through XII grossly intact. Skin: Warm and dry. No rashes or lesions. Extremities: No clubbing or cyanosis. Bilateral LE edema  Data Reviewed: I have personally reviewed following labs and imaging studies  CBC: Recent Labs  Lab 03/14/20 0331 03/18/20 0238 03/19/20 0253 03/20/20 0154  WBC 6.6 9.9 9.2 6.7  NEUTROABS  --  8.7* 8.3* 6.1  HGB 11.6* 11.0* 10.8* 10.4*  HCT 35.3* 34.3* 34.2* 33.3*  MCV 91.9 89.8 92.2 91.0  PLT 211 216 225 221    Basic Metabolic Panel: Recent Labs  Lab 03/15/20 0403 03/16/20 0245 03/17/20 0350 03/18/20 0238 03/19/20 0253 03/20/20 0154  NA 138 135 131* 135 133* 135  K 4.0 3.9 3.6 3.5 3.5 3.2*  CL 94* 92* 87* 89* 88* 87*  CO2 37* 30 32 34* 35* 38*  GLUCOSE 92 103* 140* 110* 160* 132*  BUN 27* 27* 29* 33* 19 23  CREATININE 1.24* 0.99 1.04* 0.89 0.93 0.95  CALCIUM 8.5* 8.8* 8.5* 8.6* 8.6* 8.8*  MG 2.0 1.7  --  1.7 1.9 1.9  PHOS  --   --   --  2.6  --  3.0    GFR: Estimated Creatinine Clearance: 36.4 mL/min (by C-G formula based on SCr of 0.95 mg/dL).  Liver Function Tests: Recent Labs  Lab 03/18/20 0238 03/19/20 0253 03/20/20 0154  AST 30 30 26   ALT 34 32 32  ALKPHOS 60 86 96  BILITOT 0.5 0.7 0.7  PROT 5.9* 6.2* 6.2*  ALBUMIN 2.5* 2.4* 2.3*    CBG: Recent Labs  Lab 03/18/20 2159  GLUCAP 119*     No results found for this or any previous visit (from the past 240 hour(s)).       Radiology Studies: CT ANGIO CHEST PE W OR WO CONTRAST  Result Date: 03/19/2020 CLINICAL DATA:  Positive D-dimer level. EXAM: CT ANGIOGRAPHY CHEST WITH CONTRAST TECHNIQUE: Multidetector CT imaging of the chest was performed using the standard protocol during bolus administration of intravenous  contrast. Multiplanar CT image reconstructions and MIPs were obtained to evaluate the vascular anatomy. CONTRAST:  49mL OMNIPAQUE IOHEXOL 350 MG/ML SOLN COMPARISON:  February 13, 2020. FINDINGS: Cardiovascular: Satisfactory opacification of the pulmonary arteries to the segmental level. No evidence of pulmonary embolism. Mild cardiomegaly is noted. No pericardial effusion. Atherosclerosis of thoracic aorta is noted without aneurysm formation. Enlargement of pulmonary arteries is noted suggesting pulmonary artery hypertension. Mediastinum/Nodes: No enlarged mediastinal, hilar, or axillary lymph nodes. Thyroid gland, trachea, and esophagus demonstrate no significant findings. Lungs/Pleura: No pneumothorax is noted. Emphysematous disease is noted in both lungs. Large right lower lobe airspace opacity is noted concerning for pneumonia. Minimal bilateral pleural effusions are noted with adjacent subsegmental atelectasis. 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 Upper Abdomen: No acute abnormality. Musculoskeletal: No chest wall abnormality. No acute or significant osseous findings. Review of the MIP images confirms the above findings. IMPRESSION: 1. No definite evidence of pulmonary embolus. 2. Enlargement of pulmonary arteries is noted suggesting pulmonary artery hypertension. 3. Large right lower lobe airspace opacity is noted concerning for pneumonia. 4. Minimal bilateral pleural effusions are noted with adjacent subsegmental atelectasis. 5. 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 6. Emphysema and aortic atherosclerosis. Aortic Atherosclerosis (ICD10-I70.0) and Emphysema (ICD10-J43.9). Electronically Signed   By: February 15, 2020 M.D.   On: 03/19/2020 18:14   DG CHEST PORT 1 VIEW  Result Date: 03/19/2020 CLINICAL DATA:  Shortness of breath EXAM: PORTABLE CHEST 1 VIEW COMPARISON:  March 18, 2020 FINDINGS: Multiple granulomas  bilaterally are again noted. Bibasilar atelectasis appears stable. No edema or consolidation. Areas of pleural calcification noted. There is cardiomegaly with pulmonary vascularity normal. There is aortic atherosclerosis. No bone lesions IMPRESSION: Multiple calcified granulomas as well as pleural calcifications appear stable. Bibasilar atelectasis. No appreciable new opacity. Stable cardiomegaly. Aortic Atherosclerosis (ICD10-I70.0). Electronically Signed   By: March 20, 2020 III M.D.   On: 03/19/2020 08:05   DG CHEST PORT 1 VIEW  Result Date: 03/18/2020 CLINICAL DATA:  Shortness of breath EXAM: PORTABLE CHEST 1 VIEW COMPARISON:  March 08, 2020 FINDINGS: Multiple granulomas are noted throughout the lungs bilaterally. There is bibasilar atelectatic change.  Heart is enlarged with pulmonary vascularity within normal limits. There is aortic atherosclerosis. No adenopathy evident. No bone lesions. IMPRESSION: Extensive granulomatous disease. Bibasilar atelectasis. Earliest changes of pneumonia potentially present in the right base. Stable cardiomegaly. Aortic Atherosclerosis (ICD10-I70.0). Electronically Signed   By: Bretta Bang III M.D.   On: 03/18/2020 15:58   VAS [place] LOWER EXTREMITY VENOUS (DVT)  Result Date: 03/18/2020  Lower Venous DVT Study Indications: Swelling.  Risk Factors: None identified. Comparison Study: No previous Performing Technologist: Clint Guy RVT  Examination Guidelines: Marqual Mi complete evaluation includes B-mode imaging, spectral Doppler, color Doppler, and power Doppler as needed of all accessible portions of each vessel. Bilateral testing is considered an integral part of Candis Kabel complete examination. Limited examinations for reoccurring indications may be performed as noted. The reflux portion of the exam is performed with the patient in reverse Trendelenburg.  +---------+---------------+---------+-----------+----------+--------------+ RIGHT     CompressibilityPhasicitySpontaneityPropertiesThrombus Aging +---------+---------------+---------+-----------+----------+--------------+ CFV      Full           Yes      Yes                                 +---------+---------------+---------+-----------+----------+--------------+ SFJ      Full                                                        +---------+---------------+---------+-----------+----------+--------------+ FV Prox  Full                                                        +---------+---------------+---------+-----------+----------+--------------+ FV Mid   Full                                                        +---------+---------------+---------+-----------+----------+--------------+ FV DistalFull                                                        +---------+---------------+---------+-----------+----------+--------------+ PFV      Full                                                        +---------+---------------+---------+-----------+----------+--------------+ POP      Full           Yes      Yes                                 +---------+---------------+---------+-----------+----------+--------------+ PTV      Full                                                        +---------+---------------+---------+-----------+----------+--------------+  PERO     Full                                                        +---------+---------------+---------+-----------+----------+--------------+   +---------+---------------+---------+-----------+----------+--------------+ LEFT     CompressibilityPhasicitySpontaneityPropertiesThrombus Aging +---------+---------------+---------+-----------+----------+--------------+ CFV      Full           Yes      Yes                                 +---------+---------------+---------+-----------+----------+--------------+ SFJ      Full                                                         +---------+---------------+---------+-----------+----------+--------------+ FV Prox  Full                                                        +---------+---------------+---------+-----------+----------+--------------+ FV Mid   Full                                                        +---------+---------------+---------+-----------+----------+--------------+ FV DistalFull                                                        +---------+---------------+---------+-----------+----------+--------------+ PFV      Full                                                        +---------+---------------+---------+-----------+----------+--------------+ POP      Full           Yes      Yes                                 +---------+---------------+---------+-----------+----------+--------------+ PTV      Full                                                        +---------+---------------+---------+-----------+----------+--------------+ PERO     Full                                                        +---------+---------------+---------+-----------+----------+--------------+  Summary: BILATERAL: - No evidence of deep vein thrombosis seen in the lower extremities, bilaterally. -No evidence of popliteal cyst, bilaterally.   *See table(s) above for measurements and observations. Electronically signed by Heath Lark on 03/18/2020 at 8:05:54 PM.    Final         Scheduled Meds: . (feeding supplement) PROSource Plus  30 mL Oral BID BM  . enoxaparin (LOVENOX) injection  30 mg Subcutaneous Q24H  . famotidine  20 mg Oral Daily  . feeding supplement  237 mL Oral TID BM  . metoprolol tartrate  50 mg Oral BID  . mometasone-formoterol  2 puff Inhalation BID  . multivitamin with minerals  1 tablet Oral Daily  . nicotine  21 mg Transdermal Daily  . polyethylene glycol  17 g Oral BID  . potassium chloride  40 mEq Oral Once  . QUEtiapine  25 mg  Oral BID  . senna-docusate  1 tablet Oral BID   Continuous Infusions: . azithromycin 500 mg (03/19/20 2158)  . cefTRIAXone (ROCEPHIN)  IV 2 g (03/19/20 2103)     LOS: 23 days    Time spent: over 30 min    Lacretia Nicks, MD [facility]   To contact the attending provider between 7A-7P or the covering provider during after hours 7P-7A, please log into the web site [email or web address] and access using universal [place] password for that web site. If you do not have the password, please call the hospital operator.  03/20/2020, 9:29 AM

## 2020-03-20 NOTE — Evaluation (Signed)
Clinical/Bedside Swallow Evaluation Patient Details  Name: Patricia Terrell MRN: [id number] Date of Birth: 26-Oct-1940  Today's Date: 03/20/2020 Time: SLP Start Time (ACUTE ONLY): 1205 SLP Stop Time (ACUTE ONLY): 1225 SLP Time Calculation (min) (ACUTE ONLY): 20 min  Past Medical History:  Past Medical History:  Diagnosis Date  . Emphysema lung (HCC)   . H/O: rheumatic fever   . HTN (hypertension)   . Liver cyst   . PUD (peptic ulcer disease)    Past Surgical History:  Past Surgical History:  Procedure Laterality Date  . CESAREAN SECTION     HPI:  Patient is a 80 y.o. female with PMH: dementia, COPD with emphysema, HTN, admitted 1/21-1/24/22 for COPD exacerbation then found to be COVID-19 positive and treated at home. She was then readmitted 2/3 for SOB and was diagnosed with worsening COVID-19 PNA and chronic diastolic CHF and was admitted to hospital during which time she developed worsening encephalopathy and became aggressive; psych was consulted and she has been IVC to be placed in inpatient psych facility. Patient moved from [place] with her daughter one month ago.   Assessment / Plan / Recommendation Clinical Impression  Patient presents with an oral phase of swallow that is largely functional despite not having upper dentures available and a mild pharyngeal phase versus esophageal phase swallow impairment as evidenced by immediate throat clearing after initial sip of thin liquids and mild intensity belching during PO intake. No subsequent coughing or throat clearing was observed. Patient's SpO2 was steady in range of 85-87%.  SLP to f/u with patient's diet toleration during her acute care stay. SLP Visit Diagnosis: Dysphagia, unspecified (R13.10)    Aspiration Risk  Mild aspiration risk    Diet Recommendation Regular;Thin liquid   Liquid Administration via: Cup;Straw Medication Administration: Whole meds with liquid Supervision: Patient able to self feed;Full  supervision/cueing for compensatory strategies Compensations: Minimize environmental distractions;Slow rate;Small sips/bites Postural Changes: Seated upright at 90 degrees    Other  Recommendations Oral Care Recommendations: Oral care BID   Follow up Recommendations None      Frequency and Duration min 1 x/week  1 week       Prognosis Prognosis for Safe Diet Advancement: Good      Swallow Study   General Date of Onset: 03/20/20 HPI: Patient is a 80 y.o. female with PMH: dementia, COPD with emphysema, HTN, admitted 1/21-1/24/22 for COPD exacerbation then found to be COVID-19 positive and treated at home. She was then readmitted 2/3 for SOB and was diagnosed with worsening COVID-19 PNA and chronic diastolic CHF and was admitted to hospital during which time she developed worsening encephalopathy and became aggressive; psych was consulted and she has been IVC to be placed in inpatient psych facility. Patient moved from [place] with her daughter one month ago. Type of Study: Bedside Swallow Evaluation Previous Swallow Assessment: None found Diet Prior to this Study: Regular;Thin liquids Temperature Spikes Noted: No Respiratory Status: Nasal cannula History of Recent Intubation: No Behavior/Cognition: Alert;Cooperative;Pleasant mood;Confused Oral Cavity Assessment: Within Functional Limits Oral Care Completed by SLP: Yes Oral Cavity - Dentition: Missing dentition;Dentures, not available (missing some teeth bottom, reports that she has dentures which she wears "all the time" but SLP could not find them in patient's room) Vision: Functional for self-feeding Self-Feeding Abilities: Able to feed self Patient Positioning: Upright in bed Baseline Vocal Quality: Normal Volitional Cough: Cognitively unable to elicit Volitional Swallow: Unable to elicit    Oral/Motor/Sensory Function Overall Oral Motor/Sensory Function: Within functional  limits   Ice Chips     Thin Liquid Thin Liquid:  Impaired Pharyngeal  Phase Impairments: Throat Clearing - Immediate Other Comments: one instance of immeidate throat clearing during initial sips of thin liquids but subsequent sips, even sequential straw sips did not result in any coughing or throat clearing response    Nectar Thick     Honey Thick     Puree Puree: Within functional limits   Solid     Solid: Within functional limits Presentation: Self Fed Other Comments: Patient did not exhibit any significant difficulty masticating regular solids despite not having top dentures      Angela Nevin, MA, CCC-SLP Speech Therapy [facility]

## 2020-03-21 ENCOUNTER — Inpatient Hospital Stay (HOSPITAL_COMMUNITY): Payer: Medicare HMO

## 2020-03-21 LAB — COMPREHENSIVE METABOLIC PANEL
ALT: 35 U/L (ref 0–44)
AST: 33 U/L (ref 15–41)
Albumin: 2.4 g/dL — ABNORMAL LOW (ref 3.5–5.0)
Alkaline Phosphatase: 106 U/L (ref 38–126)
Anion gap: 11 (ref 5–15)
BUN: 22 mg/dL (ref 8–23)
CO2: 38 mmol/L — ABNORMAL HIGH (ref 22–32)
Calcium: 8.8 mg/dL — ABNORMAL LOW (ref 8.9–10.3)
Chloride: 86 mmol/L — ABNORMAL LOW (ref 98–111)
Creatinine, Ser: 0.83 mg/dL (ref 0.44–1.00)
GFR, Estimated: >60 mL/min (ref >60)
Glucose, Bld: 109 mg/dL — ABNORMAL HIGH (ref 70–99)
Potassium: 3.4 mmol/L — ABNORMAL LOW (ref 3.5–5.1)
Sodium: 135 mmol/L (ref 135–145)
Total Bilirubin: 0.4 mg/dL (ref 0.3–1.2)
Total Protein: 6.3 g/dL — ABNORMAL LOW (ref 6.5–8.1)

## 2020-03-21 LAB — CBC WITH DIFFERENTIAL/PLATELET
Abs Immature Granulocytes: 0.02 10*3/uL (ref 0.00–0.07)
Basophils Absolute: 0 10*3/uL (ref 0.0–0.1)
Basophils Relative: 0 %
Eosinophils Absolute: 0 10*3/uL (ref 0.0–0.5)
Eosinophils Relative: 0 %
HCT: 34.5 % — ABNORMAL LOW (ref 36.0–46.0)
Hemoglobin: 10.7 g/dL — ABNORMAL LOW (ref 12.0–15.0)
Immature Granulocytes: 0 %
Lymphocytes Relative: 3 %
Lymphs Abs: 0.2 10*3/uL — ABNORMAL LOW (ref 0.7–4.0)
MCH: 28.6 pg (ref 26.0–34.0)
MCHC: 31 g/dL (ref 30.0–36.0)
MCV: 92.2 fL (ref 80.0–100.0)
Monocytes Absolute: 0.4 10*3/uL (ref 0.1–1.0)
Monocytes Relative: 5 %
Neutro Abs: 7.4 10*3/uL (ref 1.7–7.7)
Neutrophils Relative %: 92 %
Platelets: 246 10*3/uL (ref 150–400)
RBC: 3.74 MIL/uL — ABNORMAL LOW (ref 3.87–5.11)
RDW: 16.6 % — ABNORMAL HIGH (ref 11.5–15.5)
WBC: 8 10*3/uL (ref 4.0–10.5)
nRBC: 0 % (ref 0.0–0.2)

## 2020-03-21 LAB — PHOSPHORUS: Phosphorus: 2.7 mg/dL (ref 2.5–4.6)

## 2020-03-21 LAB — MAGNESIUM: Magnesium: 2 mg/dL (ref 1.7–2.4)

## 2020-03-21 MED ORDER — LABETALOL HCL 5 MG/ML IV SOLN: 5.0000 mg | INTRAVENOUS | Status: DC | PRN

## 2020-03-21 MED ORDER — LABETALOL HCL 5 MG/ML IV SOLN: 10.0000 mg | INTRAVENOUS | Status: DC | PRN

## 2020-03-21 MED ORDER — METOPROLOL TARTRATE 50 MG PO TABS
75.0000 mg | ORAL_TABLET | Freq: Two times a day (BID) | ORAL | Status: DC
  Administered 2020-03-21 – 2020-03-22 (×3): 75 mg via ORAL
  Filled 2020-03-21 (×3): qty 1

## 2020-03-21 MED ORDER — POTASSIUM CHLORIDE CRYS ER 20 MEQ PO TBCR
40.0000 meq | EXTENDED_RELEASE_TABLET | Freq: Once | ORAL | Status: AC
  Administered 2020-03-21: 40 meq via ORAL
  Filled 2020-03-21: qty 2

## 2020-03-21 MED ORDER — AZITHROMYCIN 250 MG PO TABS
500.0000 mg | ORAL_TABLET | Freq: Every day | ORAL | Status: AC
  Administered 2020-03-21 – 2020-03-23 (×3): 500 mg via ORAL
  Filled 2020-03-21 (×3): qty 2

## 2020-03-21 MED ORDER — AMLODIPINE BESYLATE 5 MG PO TABS: 5.0000 mg | ORAL_TABLET | Freq: Every day | ORAL | Status: DC

## 2020-03-21 NOTE — Plan of Care (Signed)
  Problem: Elimination: Goal: Will not experience complications related to urinary retention Outcome: Progressing   Problem: Pain Managment: Goal: General experience of comfort will improve Outcome: Progressing   Problem: Skin Integrity: Goal: Risk for impaired skin integrity will decrease Outcome: Progressing   

## 2020-03-21 NOTE — Progress Notes (Signed)
PROGRESS NOTE    Patricia Terrell  AUQ:[id number] DOB: [birth year] DOA: 02/24/2020 PCP: Pcp, No   Chief Complaint  Patient presents with  . Psychiatric Evaluation  . Altered Mental Status    Brief Narrative:  80 year old African-American female with past medical history significant for but not limited to dementia, COPD/emphysema, hypertension as well as other comorbidities was admitted from 02/13/20-02/16/20 for COPD exacerbation and was found to be Covid positive at that time, she was treated with 3 days of remdesivir and discharged home, she came back to the hospital on 02/26/2020 with shortness of breath.  This time she was diagnosed with worsening COVID-19 pneumonia along with acute on chronic diastolic CHF and admitted to the hospital.  She developed worsening encephalopathy and became aggressive, psych was consulted and currently she has been IVC to be placed to inpatient psych facility.  Of note patient has moved from [place] with her daughter 1 month ago, according to the daughter patient was having some memory issues for a while at [place] she is unaware of having any behavioral issues there.  Assessment & Plan:   Principal Problem:   Dementia with behavioral disturbance (HCC) Active Problems:   COPD (chronic obstructive pulmonary disease) (HCC)   HTN (hypertension)   Chronic diastolic CHF (congestive heart failure) (HCC)   Acute respiratory failure with hypoxia (HCC)   Acute hypoxemic respiratory failure (HCC)   Protein-calorie malnutrition, severe  1. Acute Hypoxic Resp. Failure due to Acute Covid 19 Viral Pneumonitis lung with acute on chronic diastolic CHF EF 75% on recent echocardiogram  Pneumonia 1. Completed covid specific treatment 2. S/p lasix 3. Last CXR 2/14 with calcified granulomas, mild R base atelectasis 4. Her initial Covid positive date is 02/13/2020, she's completed her quarantine. 5. Persistent increased O2 needs today -> CT PE protocol 2/25 with large  right lower lobe airspace opacity concerning for pneumonia (see report) 6. Ceftriaxone/azithro - mrsa pcr (negative), sputum, urine strep (negative), urine legionella (pending) - afebrile, normal WBC count broaden as needed 7. Repeat CXR 2/27 8. SLP eval -> mild aspiration risk, regular/thin liquid  2. 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  - cardiology c/s - recommended low dose beta blocker as tolerated.  Avoid overdiuresis.  Avoid Imdur or Norvasc.   3.  Hyperkalemia  Hypokalemia.  replace and follow   4.  CKD 3A.  Creatinine at or better than baseline.  5.  Smoking.  Counseled to quit, currently on NicoDerm patch.  6.  Hypertension.  Blood pressure stable on beta-blocker, as needed Lasix, monitor and adjust.  7.  Dementia with metabolic encephalopathy.  Seen by psych this admission, currently on Seroquel along with as needed Haldol.  A&Ox1 (thinks she's in [place], doesn't know date). Psych reevaluated - they now recommend follow up with neuropsych or demential specialist - continue seroquel 25 mg BID  8.  Anemia of chronic disease.  Monitor.  9.  Moderate protein calorie malnutrition.  On protein supplements.  10. GERD.  On Pepcid.   # LE edema: negative for DVT - follow BNP (improving)  DVT prophylaxis: lovenox Code Status: DNR Family Communication: daughter over phone 2/26  Disposition:   Status is: Inpatient  Remains inpatient appropriate because:Inpatient level of care appropriate due to severity of illness   Dispo: The patient is from: Home              Anticipated d/c is to:  pending              Anticipated d/c date is: > 3 days              Patient currently is not medically stable to d/c.   Difficult to place patient No       Consultants:   Cardiology   psych  Procedures:  Echo IMPRESSIONS    1. Severe concentric LVH with cavity obliteration of mid cavity and apex  with systole consistent with hypertrophic cardiomyopathy Turbulent flow  through LV/LVOT but no significant obstruction at rest. . Left ventricular  ejection fraction, by estimation,  is 70 to 75%. The left ventricle has hyperdynamic function. The left  ventricle has no regional wall motion abnormalities. Left ventricular  diastolic parameters are consistent with Grade I diastolic dysfunction  (impaired relaxation).  2. Right ventricular systolic function is normal. The right ventricular  size is normal. There is normal pulmonary artery systolic pressure.  3. A small pericardial effusion is present.  4. The mitral valve is abnormal.  5. The inferior vena cava is normal in size with greater than 50%  respiratory variability, suggesting right atrial pressure of 3 mmHg.   Antimicrobials:  Anti-infectives (From admission, onward)   Start     Dose/Rate Route Frequency Ordered Stop   03/19/20 2100  azithromycin (ZITHROMAX) 500 mg in sodium chloride 0.9 % 250 mL IVPB        500 mg 250 mL/hr over 60 Minutes Intravenous Every 24 hours 03/19/20 1932 03/24/20 2059   03/19/20 2030  cefTRIAXone (ROCEPHIN) 2 g in sodium chloride 0.9 % 100 mL IVPB        2 g 200 mL/hr over 30 Minutes Intravenous Every 24 hours 03/19/20 1932 03/24/20 2029        Subjective: Pleasantly confused No new complaints Denies pain  Thinks she's in [place], knows she's in hospital   Objective: Vitals:   03/20/20 2100 03/21/20 0430 03/21/20 0750 03/21/20 0802  BP: (!) 175/86 (!) 179/80  (!) 202/98  Pulse: 78 74  83  Resp: 17 20  19   Temp: 98.2 F (36.8 C) 98.8 F (37.1 C)    TempSrc: Oral Oral    SpO2:  95% 98% 92% 95%  Weight:      Height:        Intake/Output Summary (Last 24 hours) at 03/21/2020 0855 Last data filed at 03/20/2020 1759 Gross per 24 hour  Intake 720 ml  Output -  Net 720 ml   Filed Weights   02/26/20 1100  Weight: 48 kg    Examination:  General: No acute distress. Cardiovascular: Heart sounds show a regular rate, and rhythm.. Lungs: bibasilar crackles greater on R Abdomen: Soft, nontender, nondistended  Neurological: Alert and oriented 1-2. Moves all extremities 4. Cranial nerves II through XII grossly intact. Skin: Warm and dry. No rashes or lesions. Extremities: No clubbing or cyanosis. Bilateral LE edema    Data Reviewed: I have personally reviewed following labs and imaging studies  CBC: Recent Labs  Lab 03/18/20 0238 03/19/20 0253 03/20/20 0154 03/21/20 0152  WBC 9.9 9.2 6.7 8.0  NEUTROABS 8.7* 8.3* 6.1 7.4  HGB 11.0* 10.8* 10.4* 10.7*  HCT 34.3* 34.2*  33.3* 34.5*  MCV 89.8 92.2 91.0 92.2  PLT 216 225 221 246    Basic Metabolic Panel: Recent Labs  Lab 03/16/20 0245 03/17/20 0350 03/18/20 0238 03/19/20 0253 03/20/20 0154 03/21/20 0152  NA 135 131* 135 133* 135 135  K 3.9 3.6 3.5 3.5 3.2* 3.4*  CL 92* 87* 89* 88* 87* 86*  CO2 30 32 34* 35* 38* 38*  GLUCOSE 103* 140* 110* 160* 132* 109*  BUN 27* 29* 33* 19 23 22   CREATININE 0.99 1.04* 0.89 0.93 0.95 0.83  CALCIUM 8.8* 8.5* 8.6* 8.6* 8.8* 8.8*  MG 1.7  --  1.7 1.9 1.9 2.0  PHOS  --   --  2.6  --  3.0 2.7    GFR: Estimated Creatinine Clearance: 41.6 mL/min (by C-G formula based on SCr of 0.83 mg/dL).  Liver Function Tests: Recent Labs  Lab 03/18/20 0238 03/19/20 0253 03/20/20 0154 03/21/20 0152  AST 30 30 26  33  ALT 34 32 32 35  ALKPHOS 60 86 96 106  BILITOT 0.5 0.7 0.7 0.4  PROT 5.9* 6.2* 6.2* 6.3*  ALBUMIN 2.5* 2.4* 2.3* 2.4*    CBG: Recent Labs  Lab 03/18/20 2159  GLUCAP 119*     Recent Results (from the past 240 hour(s))  MRSA PCR Screening     Status:  None   Collection Time: 03/20/20  6:49 PM   Specimen: Nasopharyngeal  Result Value Ref Range Status   MRSA by PCR NEGATIVE NEGATIVE Final    Comment:        The GeneXpert MRSA Assay (FDA approved for NASAL specimens only), is one component of a comprehensive MRSA colonization surveillance program. It is not intended to diagnose MRSA infection nor to guide or monitor treatment for MRSA infections. Performed at [facility], [address] [place], [address] [place]          Radiology Studies: CT ANGIO CHEST PE W OR WO CONTRAST  Result Date: 03/19/2020 CLINICAL DATA:  Positive D-dimer level. EXAM: CT ANGIOGRAPHY CHEST WITH CONTRAST TECHNIQUE: Multidetector CT imaging of the chest was performed using the standard protocol during bolus administration of intravenous contrast. Multiplanar CT image reconstructions and MIPs were obtained to evaluate the vascular anatomy. CONTRAST:  60mL OMNIPAQUE IOHEXOL 350 MG/ML SOLN COMPARISON:  February 13, 2020. FINDINGS: Cardiovascular: Satisfactory opacification of the pulmonary arteries to the segmental level. No evidence of pulmonary embolism. Mild cardiomegaly is noted. No pericardial effusion. Atherosclerosis of thoracic aorta is noted without aneurysm formation. Enlargement of pulmonary arteries is noted suggesting pulmonary artery hypertension. Mediastinum/Nodes: No enlarged mediastinal, hilar, or axillary lymph nodes. Thyroid gland, trachea, and esophagus demonstrate no significant findings. Lungs/Pleura: No pneumothorax is noted. Emphysematous disease is noted in both lungs. Large right lower lobe airspace opacity is noted concerning for pneumonia. Minimal bilateral pleural effusions are noted with adjacent subsegmental atelectasis. 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 Upper Abdomen: No acute abnormality. Musculoskeletal: No chest wall abnormality. No acute or significant osseous findings. Review of  the MIP images confirms the above findings. IMPRESSION: 1. No definite evidence of pulmonary embolus. 2. Enlargement of pulmonary arteries is noted suggesting pulmonary artery hypertension. 3. Large right lower lobe airspace opacity is noted concerning for pneumonia. 4. Minimal bilateral pleural effusions are noted with adjacent subsegmental atelectasis. 5. 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 6. Emphysema and aortic atherosclerosis. Aortic Atherosclerosis (ICD10-I70.0) and Emphysema (ICD10-J43.9). Electronically Signed   By: [email or web address].  On: 03/19/2020 18:14        Scheduled Meds: . (feeding supplement) PROSource Plus  30 mL Oral BID BM  . enoxaparin (LOVENOX) injection  30 mg Subcutaneous Q24H  . famotidine  20 mg Oral Daily  . feeding supplement  237 mL Oral TID BM  . metoprolol tartrate  50 mg Oral BID  . mometasone-formoterol  2 puff Inhalation BID  . multivitamin with minerals  1 tablet Oral Daily  . nicotine  21 mg Transdermal Daily  . polyethylene glycol  17 g Oral BID  . QUEtiapine  25 mg Oral BID  . senna-docusate  1 tablet Oral BID   Continuous Infusions: . azithromycin Stopped (03/20/20 2254)  . cefTRIAXone (ROCEPHIN)  IV Stopped (03/20/20 2125)     LOS: 24 days    Time spent: over 30 min    Lacretia Nicks, MD [facility]   To contact the attending provider between 7A-7P or the covering provider during after hours 7P-7A, please log into the web site [email or web address] and access using universal [place] password for that web site. If you do not have the password, please call the hospital operator.  03/21/2020, 8:55 AM

## 2020-03-22 ENCOUNTER — Inpatient Hospital Stay (HOSPITAL_COMMUNITY): Payer: Medicare HMO

## 2020-03-22 LAB — LEGIONELLA PNEUMOPHILA SEROGP 1 UR AG: L. pneumophila Serogp 1 Ur Ag: NEGATIVE

## 2020-03-22 LAB — MAGNESIUM: Magnesium: 1.9 mg/dL (ref 1.7–2.4)

## 2020-03-22 LAB — CBC WITH DIFFERENTIAL/PLATELET
Abs Immature Granulocytes: 0.02 10*3/uL (ref 0.00–0.07)
Basophils Absolute: 0 10*3/uL (ref 0.0–0.1)
Basophils Relative: 0 %
Eosinophils Absolute: 0 10*3/uL (ref 0.0–0.5)
Eosinophils Relative: 0 %
HCT: 33.4 % — ABNORMAL LOW (ref 36.0–46.0)
Hemoglobin: 10.3 g/dL — ABNORMAL LOW (ref 12.0–15.0)
Immature Granulocytes: 0 %
Lymphocytes Relative: 5 %
Lymphs Abs: 0.3 10*3/uL — ABNORMAL LOW (ref 0.7–4.0)
MCH: 28.6 pg (ref 26.0–34.0)
MCHC: 30.8 g/dL (ref 30.0–36.0)
MCV: 92.8 fL (ref 80.0–100.0)
Monocytes Absolute: 0.3 10*3/uL (ref 0.1–1.0)
Monocytes Relative: 5 %
Neutro Abs: 5.3 10*3/uL (ref 1.7–7.7)
Neutrophils Relative %: 90 %
Platelets: 246 10*3/uL (ref 150–400)
RBC: 3.6 MIL/uL — ABNORMAL LOW (ref 3.87–5.11)
RDW: 16.4 % — ABNORMAL HIGH (ref 11.5–15.5)
WBC: 6 10*3/uL (ref 4.0–10.5)
nRBC: 0 % (ref 0.0–0.2)

## 2020-03-22 LAB — COMPREHENSIVE METABOLIC PANEL
ALT: 32 U/L (ref 0–44)
AST: 29 U/L (ref 15–41)
Albumin: 2.2 g/dL — ABNORMAL LOW (ref 3.5–5.0)
Alkaline Phosphatase: 108 U/L (ref 38–126)
Anion gap: 9 (ref 5–15)
BUN: 18 mg/dL (ref 8–23)
CO2: 39 mmol/L — ABNORMAL HIGH (ref 22–32)
Calcium: 8.7 mg/dL — ABNORMAL LOW (ref 8.9–10.3)
Chloride: 88 mmol/L — ABNORMAL LOW (ref 98–111)
Creatinine, Ser: 0.9 mg/dL (ref 0.44–1.00)
GFR, Estimated: >60 mL/min (ref >60)
Glucose, Bld: 111 mg/dL — ABNORMAL HIGH (ref 70–99)
Potassium: 3.6 mmol/L (ref 3.5–5.1)
Sodium: 136 mmol/L (ref 135–145)
Total Bilirubin: 0.6 mg/dL (ref 0.3–1.2)
Total Protein: 6.2 g/dL — ABNORMAL LOW (ref 6.5–8.1)

## 2020-03-22 LAB — PHOSPHORUS: Phosphorus: 2.9 mg/dL (ref 2.5–4.6)

## 2020-03-22 LAB — BRAIN NATRIURETIC PEPTIDE: B Natriuretic Peptide: 750.6 pg/mL — ABNORMAL HIGH (ref 0.0–100.0)

## 2020-03-22 MED ORDER — FUROSEMIDE 10 MG/ML IJ SOLN
40.0000 mg | Freq: Once | INTRAMUSCULAR | Status: DC
Start: 2020-03-22 — End: 2020-03-22

## 2020-03-22 MED ORDER — FUROSEMIDE 10 MG/ML IJ SOLN
40.0000 mg | Freq: Every day | INTRAMUSCULAR | Status: DC
  Administered 2020-03-23 – 2020-03-24 (×2): 40 mg via INTRAVENOUS
  Filled 2020-03-22 (×2): qty 4

## 2020-03-22 MED ORDER — POTASSIUM CHLORIDE CRYS ER 20 MEQ PO TBCR
40.0000 meq | EXTENDED_RELEASE_TABLET | Freq: Once | ORAL | Status: AC
  Administered 2020-03-22: 40 meq via ORAL
  Filled 2020-03-22: qty 2

## 2020-03-22 MED ORDER — RESOURCE THICKENUP CLEAR PO POWD
ORAL | Status: DC | PRN
  Filled 2020-03-22 (×2): qty 125

## 2020-03-22 MED ORDER — FUROSEMIDE 10 MG/ML IJ SOLN
40.0000 mg | Freq: Once | INTRAMUSCULAR | Status: AC
  Administered 2020-03-22: 40 mg via INTRAVENOUS
  Filled 2020-03-22: qty 4

## 2020-03-22 NOTE — Progress Notes (Addendum)
PROGRESS NOTE    Patricia Terrell  XBD:[id number] DOB: [birth year] DOA: 02/24/2020 PCP: Pcp, No   Chief Complaint  Patient presents with  . Psychiatric Evaluation  . Altered Mental Status    Brief Narrative:  80 year old African-American female with past medical history significant for but not limited to dementia, COPD/emphysema, hypertension as well as other comorbidities was admitted from 02/13/20-02/16/20 for COPD exacerbation and was found to be Covid positive at that time, she was treated with 3 days of remdesivir and discharged home, she came back to the hospital on 02/26/2020 with shortness of breath.  This time she was diagnosed with worsening COVID-19 pneumonia along with acute on chronic diastolic CHF and admitted to the hospital.  She developed worsening encephalopathy and became aggressive, psych was consulted and currently she has been IVC to be placed to inpatient psych facility.  Of note patient has moved from [place] with her daughter 1 month ago, according to the daughter patient was having some memory issues for Patricia Terrell while at [place] she is unaware of having any behavioral issues there.  Assessment & Plan:   Principal Problem:   Dementia with behavioral disturbance (HCC) Active Problems:   COPD (chronic obstructive pulmonary disease) (HCC)   HTN (hypertension)   Chronic diastolic CHF (congestive heart failure) (HCC)   Acute respiratory failure with hypoxia (HCC)   Acute hypoxemic respiratory failure (HCC)   Protein-calorie malnutrition, severe  1. Acute Hypoxic Resp. Failure due to Acute Covid 19 Viral Pneumonitis lung with acute on chronic diastolic CHF EF 75% on recent echocardiogram  Pneumonia 1. Completed covid specific treatment 2. Looks like she's getting more overloaded, CXR 2/28 with acute pulm edema on chronic lung disease 1. 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 Last CXR 2/14 with calcified granulomas, mild R base atelectasis 4. Her initial  Covid positive date is 02/13/2020, she's completed her quarantine. 5. Persistent increased O2 needs today -> CT PE protocol 2/25 with large right lower lobe airspace opacity concerning for pneumonia (see report) 6. Ceftriaxone/azithro - mrsa pcr (negative), sputum, urine strep (negative), urine legionella (pending) - afebrile, normal WBC count broaden as needed 7. SLP eval - see below  # Dysphagia speech therapy -> s/p MBS Regular/nectar thick, follow  2. 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  - cardiology c/s - recommended low dose beta blocker as tolerated.  Avoid overdiuresis.  Avoid Imdur or Norvasc.   3.  Hyperkalemia  Hypokalemia.  replace and follow   4.  CKD 3A.  Creatinine at or better than baseline.  5.  Smoking.  Counseled to quit, currently on NicoDerm patch.  6.  Hypertension.  Blood pressure stable on beta-blocker, as needed Lasix, monitor and adjust.  7.  Dementia with metabolic encephalopathy.  Seen by psych this admission, currently on Seroquel along with as needed Haldol.  Patricia Terrell&Ox1 (thinks she's in [place], doesn't know date). Psych reevaluated - they now recommend follow up with neuropsych or demential specialist - continue seroquel 25 mg BID  8.  Anemia of chronic disease.  Monitor.  9.  Moderate protein calorie malnutrition.  On protein supplements.  10. GERD.  On Pepcid.   # LE edema: negative for DVT - follow BNP (improving)  DVT prophylaxis: lovenox Code Status: DNR Family Communication: daughter over phone 2/26 Disposition:   Status is: Inpatient  Remains inpatient  appropriate because:Inpatient level of care appropriate due to severity of illness   Dispo: The patient is from: Home              Anticipated d/c is to: pending              Anticipated d/c date is: > 3 days              Patient currently is not medically stable to d/c.   Difficult to place patient No       Consultants:   Cardiology   psych  Procedures:  Echo IMPRESSIONS    1. Severe concentric LVH with cavity obliteration of mid cavity and apex  with systole consistent with hypertrophic cardiomyopathy Turbulent flow  through LV/LVOT but no significant obstruction at rest. . Left ventricular  ejection fraction, by estimation,  is 70 to 75%. The left ventricle has hyperdynamic function. The left  ventricle has no regional wall motion abnormalities. Left ventricular  diastolic parameters are consistent with Grade I diastolic dysfunction  (impaired relaxation).  2. Right ventricular systolic function is normal. The right ventricular  size is normal. There is normal pulmonary artery systolic pressure.  3. Chantel Teti small pericardial effusion is present.  4. The mitral valve is abnormal.  5. The inferior vena cava is normal in size with greater than 50%  respiratory variability, suggesting right atrial pressure of 3 mmHg.   Antimicrobials:  Anti-infectives (From admission, onward)   Start     Dose/Rate Route Frequency Ordered Stop   03/21/20 2200  azithromycin (ZITHROMAX) tablet 500 mg        500 mg Oral Daily at bedtime 03/21/20 1333 03/24/20 2159   03/19/20 2100  azithromycin (ZITHROMAX) 500 mg in sodium chloride 0.9 % 250 mL IVPB  Status:  Discontinued        500 mg 250 mL/hr over 60 Minutes Intravenous Every 24 hours 03/19/20 1932 03/21/20 1333   03/19/20 2030  cefTRIAXone (ROCEPHIN) 2 g in sodium chloride 0.9 % 100 mL IVPB        2 g 200 mL/hr over 30 Minutes Intravenous Every 24 hours 03/19/20 1932 03/24/20 2029        Subjective: No new  complaints  Objective: Vitals:   03/21/20 2023 03/21/20 2042 03/22/20 0818 03/22/20 1100  BP:  (!) 181/99    Pulse:  83  73  Resp:  18    Temp:  98.9 F (37.2 C)    TempSrc:  Oral    SpO2: 90% 90% 92% 93%  Weight:      Height:        Intake/Output Summary (Last 24 hours) at 03/22/2020 1627 Last data filed at 03/22/2020 1100 Gross per 24 hour  Intake 120 ml  Output --  Net 120 ml   Filed Weights   02/26/20 1100  Weight: 48 kg    Examination:  General: No acute distress. Cardiovascular: Heart sounds show Melvinia Ashby regular rate, and rhythm.  Lungs: diminished, bilateral crackles Abdomen: Soft, nontender, nondistended Neurological: Alert, more oriented today, knew location more readily. Moves all extremities 4. Cranial nerves II through XII grossly intact. Skin: Warm and dry. No rashes or lesions. Extremities: bilateral LE edema   Data Reviewed: I have personally reviewed following labs and imaging studies  CBC: Recent Labs  Lab 03/18/20 0238 03/19/20 0253 03/20/20 0154 03/21/20 0152 03/22/20 0312  WBC 9.9 9.2 6.7 8.0 6.0  NEUTROABS 8.7* 8.3* 6.1 7.4 5.3  HGB 11.0* 10.8* 10.4* 10.7* 10.3*  HCT 34.3* 34.2* 33.3* 34.5* 33.4*  MCV 89.8 92.2 91.0 92.2 92.8  PLT 216 225 221 246 246    Basic Metabolic Panel: Recent Labs  Lab 03/18/20 0238 03/19/20 0253 03/20/20 0154 03/21/20 0152 03/22/20 0312  NA 135 133* 135 135 136  K 3.5 3.5 3.2* 3.4* 3.6  CL 89* 88* 87* 86* 88*  CO2 34* 35* 38* 38* 39*  GLUCOSE 110* 160* 132* 109* 111*  BUN 33* CREATININE 0.89 0.93 0.95 0.83 0.90  CALCIUM 8.6* 8.6* 8.8* 8.8* 8.7*  MG 1.7 1.9 1.9 2.0 1.9  PHOS 2.6  --  3.0 2.7 2.9    GFR: Estimated Creatinine Clearance: 38.4 mL/min (by C-G formula based on SCr of 0.9 mg/dL).  Liver Function Tests: Recent Labs  Lab 03/18/20 0238 03/19/20 0253 03/20/20 0154 03/21/20 0152 03/22/20 0312  AST 33 29  ALT 34 32 32 35 32  ALKPHOS 60 86 96 106 108  BILITOT 0.5  0.7 0.7 0.4 0.6  PROT 5.9* 6.2* 6.2* 6.3* 6.2*  ALBUMIN 2.5* 2.4* 2.3* 2.4* 2.2*    CBG: Recent Labs  Lab 03/18/20 2159  GLUCAP 119*     Recent Results (from the past 240 hour(s))  MRSA PCR Screening     Status: None   Collection Time: 03/20/20  6:49 PM   Specimen: Nasopharyngeal  Result Value Ref Range Status   MRSA by PCR NEGATIVE NEGATIVE Final    Comment:        The GeneXpert MRSA Assay (FDA approved for NASAL specimens only), is one component of [facility] [place] comprehensive MRSA colonization surveillance program. It is not intended to diagnose MRSA infection nor to guide or monitor treatment for MRSA infections. Performed at [facility], [address], [place], [place] [address]          Radiology Studies: DG CHEST PORT 1 VIEW  Result Date: 03/22/2020 CLINICAL DATA:  Shortness of breath and hypoxia. EXAM: PORTABLE CHEST 1 VIEW COMPARISON:  03/21/2020 and CTA of the chest on 03/19/2020 FINDINGS: Stable cardiac enlargement and aortic tortuosity. Superimposed on chronic lung disease is some increase in interstitial prominence and pulmonary vascularity likely representing pulmonary interstitial edema. Multiple areas of bilateral pleural calcification again noted. No pleural effusions or pneumothorax identified. IMPRESSION: 1. Increase in interstitial prominence and pulmonary vascularity likely representing acute pulmonary interstitial edema superimposed on chronic lung disease. 2. Stable cardiomegaly and aortic tortuosity. Electronically Signed   By: Irish Lack M.D.   On: 03/22/2020 14:23   DG CHEST PORT 1 VIEW  Result Date: 03/21/2020 CLINICAL DATA:  COPD exacerbation. EXAM: PORTABLE CHEST 1 VIEW COMPARISON:  03/19/2020 and CT 03/19/2020 FINDINGS: Lungs are adequately inflated demonstrate stable patchy airspace opacification over the right mid and lower lung as well as left base as seen on recent CT suggesting infection. No definite effusion. Multiple known calcified  nodular pleural plaques. Stable cardiomegaly. Remainder of the exam is unchanged. IMPRESSION: 1. Stable patchy bilateral airspace process right worse than left likely infection. 2. Stable cardiomegaly. 3. 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 Electronically Signed   By: Elberta Fortis M.D.   On: 03/21/2020 09:27   DG Swallowing Func-Speech Pathology  Result Date: 03/22/2020 Objective Swallowing Evaluation: Type  of Study: MBS-Modified Barium Swallow Study  Patient Details Name: Lacara Dunsworth MRN: [id number] Date of Birth: [birth year] Today's Date: 03/22/2020 Time: SLP Start Time (ACUTE ONLY): 1333 -SLP Stop Time (ACUTE ONLY): 1405 SLP Time Calculation (min) (ACUTE ONLY): 32 min Past Medical History: Past Medical History: Diagnosis Date . Emphysema lung (HCC)  . H/O: rheumatic fever  . HTN (hypertension)  . Liver cyst  . PUD (peptic ulcer disease)  Past Surgical History: Past Surgical History: Procedure Laterality Date . CESAREAN SECTION   HPI: Patient is Ayrabella Labombard 80 y.o. female with PMH: dementia, COPD with emphysema, HTN, admitted 1/21-1/24/22 for COPD exacerbation then found to be COVID-19 positive and treated at home. She was then readmitted 2/3 for SOB and was diagnosed with worsening COVID-19 PNA and chronic diastolic CHF and was admitted to hospital during which time she developed worsening encephalopathy and became aggressive; psych was consulted and she has been IVC to be placed in inpatient psych facility. Patient moved from [place] with her daughter one month ago.  Assessment / Plan / Recommendation CHL IP CLINICAL IMPRESSIONS 03/22/2020 Clinical Impression Pt requested to go to the restroom upon arrival to radiology suite. Pt ambulated to the restroom with nasal cannula in place and on 3L, but it was out of her nose when she was assisted back to the chair and pt reported that she was not feeling well. Vitals were checked and pt's SpO2 was in the low to mid 40s. The unit was  contacted by radiology tech and Laymond Purser advised that her oxygen can be increased. It was increased to 5L by SLP and Florentina Addison, RN increased it further to 6L upon arrival to the radiology suite. Pt's SpO2 ultimately improved to 89% and was 90-91% during the study. Pt presented with oropharyngeal dysphagia characterized by impaired bolus cohesion, impaired posterior propulsion of the barium tablet which was ultimately spat out, and Shaquella Stamant pharyngeal delay. She demonstrated penetration (PAS 3, 5) before and during deglutition and silent aspiration (PAS 8) of thin liquids. Leonidus Rowand single instance of sensed aspiration (PAS 7) was noted with nectar thick liquids via straw due to impaired timing. Prompted coughing was occasionally effective, but it was mostly too weak to expel penetrated/aspirated material. Postural modifications were attempted, but pt exhibited difficulty following the necessary commands. Considering reports of pt's suboptimal p.o. intake, Kymberli Wiegand regular texture diet will be continued to maximize the pt's options. Nectar thick liquids are recommended via cup. SLP will follow for dysphagia treatment. SLP Visit Diagnosis Dysphagia, oropharyngeal phase (R13.12) Attention and concentration deficit following -- Frontal lobe and executive function deficit following -- Impact on safety and function Mild aspiration risk   CHL IP TREATMENT RECOMMENDATION 03/22/2020 Treatment Recommendations Therapy as outlined in treatment plan below   Prognosis 03/22/2020 Prognosis for Safe Diet Advancement Good Barriers to Reach Goals -- Barriers/Prognosis Comment -- CHL IP DIET RECOMMENDATION 03/22/2020 SLP Diet Recommendations Regular solids;Nectar thick liquid Liquid Administration via Cup;No straw Medication Administration Whole meds with puree Compensations Minimize environmental distractions;Slow rate;Small sips/bites Postural Changes Remain semi-upright after after feeds/meals (Comment)   CHL IP OTHER RECOMMENDATIONS 03/22/2020 Recommended  Consults -- Oral Care Recommendations Oral care BID Other Recommendations Order thickener from pharmacy   CHL IP FOLLOW UP RECOMMENDATIONS 03/22/2020 Follow up Recommendations (No Data)   CHL IP FREQUENCY AND DURATION 03/22/2020 Speech Therapy Frequency (ACUTE ONLY) min 2x/week Treatment Duration 2 weeks      CHL IP ORAL PHASE 03/22/2020 Oral Phase Impaired Oral - Pudding Teaspoon -- Oral - Pudding  Cup -- Oral - Honey Teaspoon -- Oral - Honey Cup -- Oral - Nectar Teaspoon -- Oral - Nectar Cup -- Oral - Nectar Straw -- Oral - Thin Teaspoon -- Oral - Thin Cup Premature spillage;Decreased bolus cohesion Oral - Thin Straw Premature spillage;Decreased bolus cohesion Oral - Puree Premature spillage;Decreased bolus cohesion Oral - Mech Soft -- Oral - Regular Premature spillage Oral - Multi-Consistency -- Oral - Pill Reduced posterior propulsion Oral Phase - Comment --  CHL IP PHARYNGEAL PHASE 03/22/2020 Pharyngeal Phase Impaired Pharyngeal- Pudding Teaspoon -- Pharyngeal -- Pharyngeal- Pudding Cup -- Pharyngeal -- Pharyngeal- Honey Teaspoon -- Pharyngeal -- Pharyngeal- Honey Cup -- Pharyngeal -- Pharyngeal- Nectar Teaspoon -- Pharyngeal -- Pharyngeal- Nectar Cup Delayed swallow initiation-vallecula Pharyngeal -- Pharyngeal- Nectar Straw Delayed swallow initiation-pyriform sinuses;Penetration/Aspiration during swallow Pharyngeal Material enters airway, passes BELOW cords and not ejected out despite cough attempt by patient Pharyngeal- Thin Teaspoon -- Pharyngeal -- Pharyngeal- Thin Cup Delayed swallow initiation-pyriform sinuses;Penetration/Aspiration before swallow;Penetration/Aspiration during swallow Pharyngeal Material enters airway, remains ABOVE vocal cords and not ejected out;Material enters airway, CONTACTS cords and not ejected out;Material enters airway, passes BELOW cords without attempt by patient to eject out (silent aspiration) Pharyngeal- Thin Straw Delayed swallow initiation-pyriform  sinuses;Penetration/Aspiration during swallow Pharyngeal Material enters airway, passes BELOW cords without attempt by patient to eject out (silent aspiration);Material enters airway, remains ABOVE vocal cords and not ejected out;Material enters airway, CONTACTS cords and not ejected out Pharyngeal- Puree Delayed swallow initiation-vallecula Pharyngeal -- Pharyngeal- Mechanical Soft -- Pharyngeal -- Pharyngeal- Regular Delayed swallow initiation-vallecula Pharyngeal -- Pharyngeal- Multi-consistency -- Pharyngeal -- Pharyngeal- Pill -- Pharyngeal -- Pharyngeal Comment --  CHL IP CERVICAL ESOPHAGEAL PHASE 03/22/2020 Cervical Esophageal Phase WFL Pudding Teaspoon -- Pudding Cup -- Honey Teaspoon -- Honey Cup -- Nectar Teaspoon -- Nectar Cup -- Nectar Straw -- Thin Teaspoon -- Thin Cup -- Thin Straw -- Puree -- Mechanical Soft -- Regular -- Multi-consistency -- Pill -- Cervical Esophageal Comment -- Shanika I. Vear Clock, MS, CCC-SLP Acute Rehabilitation Services Office number [phone] Pager [phone] Scheryl Marten 03/22/2020, 3:15 PM                   Scheduled Meds: . (feeding supplement) PROSource Plus  30 mL Oral BID BM  . azithromycin  500 mg Oral QHS  . enoxaparin (LOVENOX) injection  30 mg Subcutaneous Q24H  . famotidine  20 mg Oral Daily  . feeding supplement  237 mL Oral TID BM  . furosemide  40 mg Intravenous Once  . [START ON 03/23/2020] furosemide  40 mg Intravenous Daily  . metoprolol tartrate  75 mg Oral BID  . mometasone-formoterol  2 puff Inhalation BID  . multivitamin with minerals  1 tablet Oral Daily  . nicotine  21 mg Transdermal Daily  . polyethylene glycol  17 g Oral BID  . potassium chloride  40 mEq Oral Once  . QUEtiapine  25 mg Oral BID  . senna-docusate  1 tablet Oral BID   Continuous Infusions: . cefTRIAXone (ROCEPHIN)  IV 2 g (03/21/20 2109)     LOS: 25 days    Time spent: over 30 min    Lacretia Nicks, MD [facility]   To contact the  attending provider between 7A-7P or the covering provider during after hours 7P-7A, please log into the web site [email or web address] and access using universal [place] password for that web site. If you do not have the password, please call the hospital operator.  03/22/2020, 4:27 PM

## 2020-03-22 NOTE — Progress Notes (Signed)
Modified Barium Swallow Progress Note  Patient Details  Name: Patricia Terrell MRN: [id number] Date of Birth: Jun 04, 1940  Today's Date: 03/22/2020  Modified Barium Swallow completed.  Full report located under Chart Review in the Imaging Section.  Brief recommendations include the following:  Clinical Impression  Pt requested to go to the restroom upon arrival to radiology suite. Pt ambulated to the restroom with nasal cannula in place and on 3L, but it was out of her nose when she was assisted back to the chair and pt reported that she was not feeling well. Vitals were checked and pt's SpO2 was in the low to mid 40s. The unit was contacted by radiology tech and Laymond Purser advised that her oxygen can be increased. It was increased to 5L by SLP and Florentina Addison, RN increased it further to 6L upon arrival to the radiology suite. Pt's SpO2 ultimately improved to 89% and was 90-91% during the study. Pt presented with oropharyngeal dysphagia characterized by impaired bolus cohesion, impaired posterior propulsion of the barium tablet which was ultimately spat out, and a pharyngeal delay. She demonstrated penetration (PAS 3, 5) before and during deglutition and silent aspiration (PAS 8) of thin liquids. A single instance of sensed aspiration (PAS 7) was noted with nectar thick liquids via straw due to impaired timing. Prompted coughing was occasionally effective, but it was mostly too weak to expel penetrated/aspirated material. Postural modifications were attempted, but pt exhibited difficulty following the necessary commands. Considering reports of pt's suboptimal p.o. intake, a regular texture diet will be continued to maximize the pt's options. Nectar thick liquids are recommended via cup. SLP will follow for dysphagia treatment.   Swallow Evaluation Recommendations       SLP Diet Recommendations: Regular solids;Nectar thick liquid   Liquid Administration via: Cup;No straw   Medication Administration: Whole  meds with puree   Supervision: Staff to assist with self feeding   Compensations: Minimize environmental distractions;Slow rate;Small sips/bites   Postural Changes: Remain semi-upright after after feeds/meals (Comment)   Oral Care Recommendations: Oral care BID   Other Recommendations: Order thickener from pharmacy  Morrill I. Vear Clock, MS, CCC-SLP Acute Rehabilitation Services Office number [phone] Pager [phone]  Scheryl Marten 03/22/2020,3:08 PM

## 2020-03-22 NOTE — Progress Notes (Signed)
  Speech Language Pathology Treatment: Dysphagia  Patient Details Name: Patricia Terrell MRN: [id number] DOB: [birth year] Today's Date: 03/22/2020 Time: 4098-1191 SLP Time Calculation (min) (ACUTE ONLY): 13 min  Assessment / Plan / Recommendation Clinical Impression  Pt was seen for dysphagia treatment with her daughter present for most of the session. Nursing reported that the held pills briefly, but tolerated them without overt s/sx of aspiration. Pt initially reported odynophagia, but subsequently denied this and stated that things feel "strange" when she swallows. Pt's swallow appeared [facility] for limited trials of thin liquids via straw. Pt refused all solids including a sandwich brought by her daughter, despite encouragement from this SLP and pt's family. A modified barium swallow study is recommended to further assess swallow function and help guide intervention. Pt and her daughter were both in agreement with this.    HPI HPI: Patient is a 80 y.o. female with PMH: dementia, COPD with emphysema, HTN, admitted 1/21-1/24/22 for COPD exacerbation then found to be COVID-19 positive and treated at home. She was then readmitted 2/3 for SOB and was diagnosed with worsening COVID-19 PNA and chronic diastolic CHF and was admitted to hospital during which time she developed worsening encephalopathy and became aggressive; psych was consulted and she has been IVC to be placed in inpatient psych facility. Patient moved from [place] with her daughter one month ago. CXR 2/25: Stable patchy bilateral airspace process right worse than left  likely infection.      SLP Plan  MBS       Recommendations  Diet recommendations: Regular;Thin liquid Liquids provided via: Cup;Straw Medication Administration: Whole meds with liquid Supervision: Patient able to self feed Compensations: Minimize environmental distractions;Slow rate;Small sips/bites                Oral Care Recommendations: Oral care BID Follow  up Recommendations: None SLP Visit Diagnosis: Dysphagia, unspecified (R13.10) Plan: MBS       Laker Thompson I. Vear Clock, MS, CCC-SLP Acute Rehabilitation Services Office number [phone] Pager [phone]                Scheryl Marten 03/22/2020, 10:07 AM

## 2020-03-22 NOTE — Plan of Care (Signed)
  Problem: Safety: Goal: Ability to remain free from injury will improve Outcome: Progressing   Problem: Skin Integrity: Goal: Risk for impaired skin integrity will decrease Outcome: Progressing   Problem: Respiratory: Goal: Will maintain a patent airway Outcome: Progressing   

## 2020-03-23 ENCOUNTER — Inpatient Hospital Stay (HOSPITAL_COMMUNITY): Payer: Medicare HMO

## 2020-03-23 DIAGNOSIS — I5033 Acute on chronic diastolic (congestive) heart failure: Secondary | ICD-10-CM | POA: Diagnosis not present

## 2020-03-23 LAB — CBC WITH DIFFERENTIAL/PLATELET
Abs Immature Granulocytes: 0.02 10*3/uL (ref 0.00–0.07)
Basophils Absolute: 0 10*3/uL (ref 0.0–0.1)
Basophils Relative: 0 %
Eosinophils Absolute: 0.2 10*3/uL (ref 0.0–0.5)
Eosinophils Relative: 3 %
HCT: 36.5 % (ref 36.0–46.0)
Hemoglobin: 11.1 g/dL — ABNORMAL LOW (ref 12.0–15.0)
Immature Granulocytes: 0 %
Lymphocytes Relative: 3 %
Lymphs Abs: 0.2 10*3/uL — ABNORMAL LOW (ref 0.7–4.0)
MCH: 28.7 pg (ref 26.0–34.0)
MCHC: 30.4 g/dL (ref 30.0–36.0)
MCV: 94.3 fL (ref 80.0–100.0)
Monocytes Absolute: 0.4 10*3/uL (ref 0.1–1.0)
Monocytes Relative: 6 %
Neutro Abs: 5.8 10*3/uL (ref 1.7–7.7)
Neutrophils Relative %: 88 %
Platelets: 258 10*3/uL (ref 150–400)
RBC: 3.87 MIL/uL (ref 3.87–5.11)
RDW: 16.2 % — ABNORMAL HIGH (ref 11.5–15.5)
WBC: 6.6 10*3/uL (ref 4.0–10.5)
nRBC: 0 % (ref 0.0–0.2)

## 2020-03-23 LAB — COMPREHENSIVE METABOLIC PANEL
ALT: 35 U/L (ref 0–44)
AST: 28 U/L (ref 15–41)
Albumin: 2.3 g/dL — ABNORMAL LOW (ref 3.5–5.0)
Alkaline Phosphatase: 103 U/L (ref 38–126)
Anion gap: 12 (ref 5–15)
BUN: 18 mg/dL (ref 8–23)
CO2: 43 mmol/L — ABNORMAL HIGH (ref 22–32)
Calcium: 9.2 mg/dL (ref 8.9–10.3)
Chloride: 84 mmol/L — ABNORMAL LOW (ref 98–111)
Creatinine, Ser: 0.96 mg/dL (ref 0.44–1.00)
GFR, Estimated: >60 mL/min (ref >60)
Glucose, Bld: 104 mg/dL — ABNORMAL HIGH (ref 70–99)
Potassium: 3.7 mmol/L (ref 3.5–5.1)
Sodium: 139 mmol/L (ref 135–145)
Total Bilirubin: 0.3 mg/dL (ref 0.3–1.2)
Total Protein: 6.7 g/dL (ref 6.5–8.1)

## 2020-03-23 LAB — MAGNESIUM: Magnesium: 1.9 mg/dL (ref 1.7–2.4)

## 2020-03-23 LAB — PHOSPHORUS: Phosphorus: 3.3 mg/dL (ref 2.5–4.6)

## 2020-03-23 LAB — BRAIN NATRIURETIC PEPTIDE: B Natriuretic Peptide: 740.9 pg/mL — ABNORMAL HIGH (ref 0.0–100.0)

## 2020-03-23 MED ORDER — IPRATROPIUM-ALBUTEROL 0.5-2.5 (3) MG/3ML IN SOLN
3.0000 mL | Freq: Three times a day (TID) | RESPIRATORY_TRACT | Status: DC
  Administered 2020-03-23 – 2020-03-31 (×22): 3 mL via RESPIRATORY_TRACT
  Filled 2020-03-23 (×23): qty 3

## 2020-03-23 MED ORDER — SODIUM CHLORIDE 0.9 % IV SOLN
2.0000 g | Freq: Two times a day (BID) | INTRAVENOUS | Status: AC
  Administered 2020-03-23 – 2020-03-29 (×14): 2 g via INTRAVENOUS
  Filled 2020-03-23 (×14): qty 2

## 2020-03-23 MED ORDER — METOPROLOL TARTRATE 100 MG PO TABS
100.0000 mg | ORAL_TABLET | Freq: Two times a day (BID) | ORAL | Status: DC
  Administered 2020-03-23 – 2020-04-01 (×17): 100 mg via ORAL
  Filled 2020-03-23 (×20): qty 1

## 2020-03-23 NOTE — Progress Notes (Signed)
PROGRESS NOTE    Patricia Terrell  WUJ:[id number] DOB: [birth year] DOA: 02/24/2020 PCP: Pcp, No   Chief Complaint  Patient presents with  . Psychiatric Evaluation  . Altered Mental Status    Brief Narrative:  80 year old African-American female with past medical history significant for but not limited to dementia, COPD/emphysema, hypertension as well as other comorbidities was admitted from 02/13/20-02/16/20 for COPD exacerbation and was found to be Covid positive at that time, she was treated with 3 days of remdesivir and discharged home, she came back to the hospital on 02/26/2020 with shortness of breath.  This time she was diagnosed with worsening COVID-19 pneumonia along with acute on chronic diastolic CHF and admitted to the hospital.  She developed worsening encephalopathy and became aggressive, psych was consulted and currently she has been IVC to be placed to inpatient psych facility.  Of note patient has moved from [place] with her daughter 1 month ago, according to the daughter patient was having some memory issues for [place] while at [place] she is unaware of having any behavioral issues there.  Assessment & Plan:   Principal Problem:   Dementia with behavioral disturbance (HCC) Active Problems:   COPD (chronic obstructive pulmonary disease) (HCC)   HTN (hypertension)   Chronic diastolic CHF (congestive heart failure) (HCC)   Acute respiratory failure with hypoxia (HCC)   Acute hypoxemic respiratory failure (HCC)   Protein-calorie malnutrition, severe  1. Acute Hypoxic Resp. Failure due to Acute Covid 19 Viral Pneumonitis lung with acute on chronic diastolic CHF EF 75% on recent echocardiogram  Hospital Acquired Pneumonia 1. Completed covid specific treatment 2. CXR 3/1 with interstitial changes and RLL consolidation 3. Looks like she's getting more overloaded, CXR 2/28 with acute pulm edema on chronic lung disease 1. 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 Last CXR 2/14 with calcified granulomas, mild R base atelectasis 5. Her initial Covid positive date is 02/13/2020, she's completed her quarantine.  Completed covid specific treatment (steroids completed on 2/16). 6. Persistent increased O2 needs today -> CT PE protocol 2/25 with large right lower lobe airspace opacity concerning for pneumonia (see report) 7. cefepime/azithro - mrsa pcr (negative), sputum, urine strep (negative), urine legionella (negative) - afebrile, normal WBC count - given lack of improvement, broaden to cefepime 8. SLP eval - see below  # Dysphagia speech therapy -> s/p MBS on 2/28 Regular/nectar thick, follow  2. 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  - cardiology c/s - recommended low dose beta blocker as tolerated.  Avoid overdiuresis.  Avoid Imdur or Norvasc.   3.  Hyperkalemia  Hypokalemia.  replace and follow   4.  CKD 3A.  Creatinine at or better than baseline.  5.  Smoking.  Counseled to quit, currently on NicoDerm patch.  6.  Hypertension.  Blood pressure stable on beta-blocker, as needed Lasix, monitor and adjust.  7.  Dementia with metabolic encephalopathy.  Seen by psych this admission, currently on Seroquel along with as needed Haldol.  Patricia Terrell&Ox1 (thinks she's in [place], doesn't know date). Psych reevaluated - they now recommend follow up with neuropsych or demential specialist - continue seroquel 25 mg BID  8.  Anemia of chronic disease.  Monitor.  9.  Moderate protein calorie malnutrition.  On protein supplements.  10. GERD.  On Pepcid.   # LE edema: negative for DVT - treating for HF exacerbation as above  DVT prophylaxis:  lovenox Code Status: DNR Family Communication: daughter over phone 2/26 Disposition:   Status is: Inpatient  Remains inpatient appropriate because:Inpatient level of care appropriate due to severity of illness   Dispo: The patient is from: Home              Anticipated d/c is to: pending              Anticipated d/c date is: > 3 days              Patient currently is not medically stable to d/c.   Difficult to place patient No       Consultants:   Cardiology   psych  Procedures:  Echo IMPRESSIONS    1. Severe concentric LVH with cavity obliteration of mid cavity and apex  with systole consistent with hypertrophic cardiomyopathy Turbulent flow  through LV/LVOT but no significant obstruction at rest. . Left ventricular  ejection fraction, by estimation,  is 70 to 75%. The left ventricle has hyperdynamic function. The left  ventricle has no regional wall motion abnormalities. Left ventricular  diastolic parameters are consistent with Grade I diastolic dysfunction  (impaired relaxation).  2. Right ventricular systolic function is normal. The right ventricular  size is normal. There is normal pulmonary artery systolic pressure.  3. Patricia Terrell small pericardial effusion is present.  4. The mitral valve is abnormal.  5. The inferior vena cava is normal in size with greater than 50%  respiratory variability, suggesting right atrial pressure of 3 mmHg.   Antimicrobials:  Anti-infectives (From admission, onward)   Start     Dose/Rate Route Frequency Ordered Stop   03/21/20 2200  azithromycin (ZITHROMAX) tablet 500 mg        500 mg Oral Daily at bedtime 03/21/20 1333 03/24/20 2159   03/19/20 2100  azithromycin (ZITHROMAX) 500 mg in sodium chloride 0.9 % 250 mL IVPB  Status:  Discontinued        500 mg 250 mL/hr over 60 Minutes Intravenous Every 24 hours 03/19/20 1932 03/21/20 1333   03/19/20 2030  cefTRIAXone (ROCEPHIN) 2 g in sodium chloride 0.9 % 100 mL IVPB        2 g 200  mL/hr over 30 Minutes Intravenous Every 24 hours 03/19/20 1932 03/24/20 2029        Subjective: No new complaints  Objective: Vitals:   03/22/20 1100 03/22/20 2115 03/23/20 0754 03/23/20 1028  BP:  (!) 191/99  (!) 167/97  Pulse: 73 80  70  Resp:  16  20  Temp:  98.2 F (36.8 C)    TempSrc:  Oral    SpO2: 93% 92% 92% 91%  Weight:      Height:        Intake/Output Summary (Last 24 hours) at 03/23/2020 1102 Last data filed at 03/23/2020 0900 Gross per 24 hour  Intake 50 ml  Output --  Net 50 ml   Filed Weights   02/26/20 1100  Weight: 48 kg    Examination:  General: No acute distress. Cardiovascular: Heart sounds show Khori Rosevear regular rate, and rhythm Lungs: bibasilar crackles Abdomen: Soft, nontender, nondistended Neurological: Alert and oriented 3. Moves all extremities 4. Cranial nerves II through XII grossly intact. Skin: Warm and dry. No rashes or lesions. Extremities: bilateral LE edema, appears  improved   Data Reviewed: I have personally reviewed following labs and imaging studies  CBC: Recent Labs  Lab 03/19/20 0253 03/20/20 0154 03/21/20 0152 03/22/20 0312 03/23/20 0313  WBC 9.2 6.7 8.0 6.0 6.6  NEUTROABS 8.3* 6.1 7.4 5.3 5.8  HGB 10.8* 10.4* 10.7* 10.3* 11.1*  HCT 34.2* 33.3* 34.5* 33.4* 36.5  MCV 92.2 91.0 92.2 92.8 94.3  PLT 225 221 246 246 258    Basic Metabolic Panel: Recent Labs  Lab 03/18/20 0238 03/19/20 0253 03/20/20 0154 03/21/20 0152 03/22/20 0312 03/23/20 0313  NA 135 133* 135 135 136 139  K 3.5 3.5 3.2* 3.4* 3.6 3.7  CL 89* 88* 87* 86* 88* 84*  CO2 34* 35* 38* 38* 39* 43*  GLUCOSE 110* 160* 132* 109* 111* 104*  BUN 33* CREATININE 0.89 0.93 0.95 0.83 0.90 0.96  CALCIUM 8.6* 8.6* 8.8* 8.8* 8.7* 9.2  MG 1.7 1.9 1.9 2.0 1.9 1.9  PHOS 2.6  --  3.0 2.7 2.9 3.3    GFR: Estimated Creatinine Clearance: 36 mL/min (by C-G formula based on SCr of 0.96 mg/dL).  Liver Function Tests: Recent Labs  Lab 03/19/20 0253  03/20/20 0154 03/21/20 0152 03/22/20 0312 03/23/20 0313  AST 30 26 33 29 28  ALT 32 32 35 32 35  ALKPHOS 86 96 106 108 103  BILITOT 0.7 0.7 0.4 0.6 0.3  PROT 6.2* 6.2* 6.3* 6.2* 6.7  ALBUMIN 2.4* 2.3* 2.4* 2.2* 2.3*    CBG: Recent Labs  Lab 03/18/20 2159  GLUCAP 119*     Recent Results (from the past 240 hour(s))  MRSA PCR Screening     Status: None   Collection Time: 03/20/20  6:49 PM   Specimen: Nasopharyngeal  Result Value Ref Range Status   MRSA by PCR NEGATIVE NEGATIVE Final    Comment:        The GeneXpert MRSA Assay (FDA approved for NASAL specimens only), is one component of Angelita Harnack comprehensive MRSA colonization surveillance program. It is not intended to diagnose MRSA infection nor to guide or monitor treatment for MRSA infections. Performed at [facility], [address], [place], [place] [address]          Radiology Studies: DG CHEST PORT 1 VIEW  Result Date: 03/23/2020 CLINICAL DATA:  Shortness of breath EXAM: PORTABLE CHEST 1 VIEW COMPARISON:  03/22/2020 FINDINGS: Cardiac shadow remains enlarged. Aortic calcifications are again seen. Scattered calcified pleural plaques are again identified bilaterally. Diffuse emphysematous changes are noted as well as persistent consolidation in the right lower lobe stable from prior exams. Given some technical variations in the imaging the overall appearance is relatively stable. No bony abnormality is seen. 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 Underlying interstitial changes and right lower lobe consolidation are noted. Electronically Signed   By: Alcide Clever M.D.   On: 03/23/2020 08:07   DG CHEST PORT 1 VIEW  Result Date: 03/22/2020 CLINICAL DATA:  Shortness of breath and hypoxia. EXAM: PORTABLE CHEST 1 VIEW COMPARISON:  03/21/2020 and CTA of the chest on 03/19/2020 FINDINGS: Stable cardiac enlargement and aortic tortuosity. Superimposed on chronic lung disease is some increase in  interstitial prominence and pulmonary vascularity likely representing pulmonary interstitial edema. Multiple areas of bilateral pleural calcification again noted. No pleural effusions or pneumothorax identified. IMPRESSION: 1. Increase in interstitial prominence and pulmonary vascularity likely representing acute pulmonary interstitial edema superimposed on chronic lung disease. 2. Stable cardiomegaly and aortic tortuosity. Electronically Signed  By: Irish Lack M.D.   On: 03/22/2020 14:23   DG Swallowing Func-Speech Pathology  Result Date: 03/22/2020 Objective Swallowing Evaluation: Type of Study: MBS-Modified Barium Swallow Study  Patient Details Name: Patricia Terrell MRN: [id number] Date of Birth: [birth year] Today's Date: 03/22/2020 Time: SLP Start Time (ACUTE ONLY): 1333 -SLP Stop Time (ACUTE ONLY): 1405 SLP Time Calculation (min) (ACUTE ONLY): 32 min Past Medical History: Past Medical History: Diagnosis Date . Emphysema lung (HCC)  . H/O: rheumatic fever  . HTN (hypertension)  . Liver cyst  . PUD (peptic ulcer disease)  Past Surgical History: Past Surgical History: Procedure Laterality Date . CESAREAN SECTION   HPI: Patient is Patricia Terrell 80 y.o. female with PMH: dementia, COPD with emphysema, HTN, admitted 1/21-1/24/22 for COPD exacerbation then found to be COVID-19 positive and treated at home. She was then readmitted 2/3 for SOB and was diagnosed with worsening COVID-19 PNA and chronic diastolic CHF and was admitted to hospital during which time she developed worsening encephalopathy and became aggressive; psych was consulted and she has been IVC to be placed in inpatient psych facility. Patient moved from [place] with her daughter one month ago.  Assessment / Plan / Recommendation CHL IP CLINICAL IMPRESSIONS 03/22/2020 Clinical Impression Pt requested to go to the restroom upon arrival to radiology suite. Pt ambulated to the restroom with nasal cannula in place and on 3L, but it was out of her nose when she was  assisted back to the chair and pt reported that she was not feeling well. Vitals were checked and pt's SpO2 was in the low to mid 40s. The unit was contacted by radiology tech and Patricia Terrell advised that her oxygen can be increased. It was increased to 5L by SLP and Patricia Addison, Patricia Terrell increased it further to 6L upon arrival to the radiology suite. Pt's SpO2 ultimately improved to 89% and was 90-91% during the study. Pt presented with oropharyngeal dysphagia characterized by impaired bolus cohesion, impaired posterior propulsion of the barium tablet which was ultimately spat out, and Patricia Terrell pharyngeal delay. She demonstrated penetration (PAS 3, 5) before and during deglutition and silent aspiration (PAS 8) of thin liquids. Patricia Terrell single instance of sensed aspiration (PAS 7) was noted with nectar thick liquids via straw due to impaired timing. Prompted coughing was occasionally effective, but it was mostly too weak to expel penetrated/aspirated material. Postural modifications were attempted, but pt exhibited difficulty following the necessary commands. Considering reports of pt's suboptimal p.o. intake, Patricia Terrell regular texture diet will be continued to maximize the pt's options. Nectar thick liquids are recommended via cup. SLP will follow for dysphagia treatment. SLP Visit Diagnosis Dysphagia, oropharyngeal phase (R13.12) Attention and concentration deficit following -- Frontal lobe and executive function deficit following -- Impact on safety and function Mild aspiration risk   CHL IP TREATMENT RECOMMENDATION 03/22/2020 Treatment Recommendations Therapy as outlined in treatment plan below   Prognosis 03/22/2020 Prognosis for Safe Diet Advancement Good Barriers to Reach Goals -- Barriers/Prognosis Comment -- CHL IP DIET RECOMMENDATION 03/22/2020 SLP Diet Recommendations Regular solids;Nectar thick liquid Liquid Administration via Cup;No straw Medication Administration Whole meds with puree Compensations Minimize environmental distractions;Slow  rate;Small sips/bites Postural Changes Remain semi-upright after after feeds/meals (Comment)   CHL IP OTHER RECOMMENDATIONS 03/22/2020 Recommended Consults -- Oral Care Recommendations Oral care BID Other Recommendations Order thickener from pharmacy   CHL IP FOLLOW UP RECOMMENDATIONS 03/22/2020 Follow up Recommendations (No Data)   CHL IP FREQUENCY AND DURATION 03/22/2020 Speech Therapy Frequency (ACUTE ONLY) min 2x/week Treatment  Duration 2 weeks      CHL IP ORAL PHASE 03/22/2020 Oral Phase Impaired Oral - Pudding Teaspoon -- Oral - Pudding Cup -- Oral - Honey Teaspoon -- Oral - Honey Cup -- Oral - Nectar Teaspoon -- Oral - Nectar Cup -- Oral - Nectar Straw -- Oral - Thin Teaspoon -- Oral - Thin Cup Premature spillage;Decreased bolus cohesion Oral - Thin Straw Premature spillage;Decreased bolus cohesion Oral - Puree Premature spillage;Decreased bolus cohesion Oral - Mech Soft -- Oral - Regular Premature spillage Oral - Multi-Consistency -- Oral - Pill Reduced posterior propulsion Oral Phase - Comment --  CHL IP PHARYNGEAL PHASE 03/22/2020 Pharyngeal Phase Impaired Pharyngeal- Pudding Teaspoon -- Pharyngeal -- Pharyngeal- Pudding Cup -- Pharyngeal -- Pharyngeal- Honey Teaspoon -- Pharyngeal -- Pharyngeal- Honey Cup -- Pharyngeal -- Pharyngeal- Nectar Teaspoon -- Pharyngeal -- Pharyngeal- Nectar Cup Delayed swallow initiation-vallecula Pharyngeal -- Pharyngeal- Nectar Straw Delayed swallow initiation-pyriform sinuses;Penetration/Aspiration during swallow Pharyngeal Material enters airway, passes BELOW cords and not ejected out despite cough attempt by patient Pharyngeal- Thin Teaspoon -- Pharyngeal -- Pharyngeal- Thin Cup Delayed swallow initiation-pyriform sinuses;Penetration/Aspiration before swallow;Penetration/Aspiration during swallow Pharyngeal Material enters airway, remains ABOVE vocal cords and not ejected out;Material enters airway, CONTACTS cords and not ejected out;Material enters airway, passes BELOW cords  without attempt by patient to eject out (silent aspiration) Pharyngeal- Thin Straw Delayed swallow initiation-pyriform sinuses;Penetration/Aspiration during swallow Pharyngeal Material enters airway, passes BELOW cords without attempt by patient to eject out (silent aspiration);Material enters airway, remains ABOVE vocal cords and not ejected out;Material enters airway, CONTACTS cords and not ejected out Pharyngeal- Puree Delayed swallow initiation-vallecula Pharyngeal -- Pharyngeal- Mechanical Soft -- Pharyngeal -- Pharyngeal- Regular Delayed swallow initiation-vallecula Pharyngeal -- Pharyngeal- Multi-consistency -- Pharyngeal -- Pharyngeal- Pill -- Pharyngeal -- Pharyngeal Comment --  CHL IP CERVICAL ESOPHAGEAL PHASE 03/22/2020 Cervical Esophageal Phase WFL Pudding Teaspoon -- Pudding Cup -- Honey Teaspoon -- Honey Cup -- Nectar Teaspoon -- Nectar Cup -- Nectar Straw -- Thin Teaspoon -- Thin Cup -- Thin Straw -- Puree -- Mechanical Soft -- Regular -- Multi-consistency -- Pill -- Cervical Esophageal Comment -- Patricia I. Vear ClockPhillips, Patricia Terrell, Patricia Terrell [facility] Office number [phone] Pager [phone] Scheryl MartenShanika I Phillips 03/22/2020, 3:15 PM                   Scheduled Meds: . (feeding supplement) PROSource Plus  30 mL Oral BID BM  . azithromycin  500 mg Oral QHS  . enoxaparin (LOVENOX) injection  30 mg Subcutaneous Q24H  . famotidine  20 mg Oral Daily  . feeding supplement  237 mL Oral TID BM  . furosemide  40 mg Intravenous Daily  . metoprolol tartrate  100 mg Oral BID  . mometasone-formoterol  2 puff Inhalation BID  . multivitamin with minerals  1 tablet Oral Daily  . nicotine  21 mg Transdermal Daily  . polyethylene glycol  17 g Oral BID  . QUEtiapine  25 mg Oral BID  . senna-docusate  1 tablet Oral BID   Continuous Infusions: . cefTRIAXone (ROCEPHIN)  IV 2 g (03/22/20 2138)     LOS: 26 days    Time spent: over 30 min    Lacretia Nicksaldwell Powell, MD [facility]   To contact the attending provider between 7A-7P or the covering provider during after hours 7P-7A, please log into the web site [email or web address] and access using universal [place] password for that web site. If you do not have the password, please call the hospital operator.  03/23/2020,  11:02 AM

## 2020-03-23 NOTE — Progress Notes (Addendum)
Pharmacy Antibiotic Note  Patricia Terrell is a 80 y.o. female admitted on 02/24/2020 with shortness of breath.  Readmit after recent hospitalization  from 02/13/20-02/16/20 for COPD exacerbation and was found to be Covid positive at that time. She was treated with 3 days of remdesivir and discharged home,   Pharmacy has been consulted for Cefepime  dosing for HCAP.  Plan: Cefepime 2 g IV every 12 hours  (CrCl ~36 ml/min) Monitor clinical status, renal function and culture results daily.    Height: 5\' 5"  (165.1 cm) Weight: 48 kg (105 lb 13.1 oz) IBW/kg (Calculated) : 57  Temp (24hrs), Avg:98.2 F (36.8 C), Min:98.1 F (36.7 C), Max:98.2 F (36.8 C)  Recent Labs  Lab 03/19/20 0253 03/20/20 0154 03/21/20 0152 03/22/20 0312 03/23/20 0313  WBC 9.2 6.7 8.0 6.0 6.6  CREATININE 0.93 0.95 0.83 0.90 0.96    Estimated Creatinine Clearance: 36 mL/min (by C-G formula based on SCr of 0.96 mg/dL).    No Known Allergies  Antimicrobials this admission Ceftriaxone 2/25 << 3/1 Azithromycin 2/25 << 3/1 Cefepime 3/1>>  Dose adjustments this admission:   Microbiology results: 2/3 Blood Cx x2: negative 2/26 MRSA PCR: negative   Thank you for allowing pharmacy to be a part of this patient's care. 3/26, RPh Clinical Pharmacist Please check AMION for all [facility] phone numbers After 10:00 PM, call Main Pharmacy [phone]   03/23/2020 1:49 PM

## 2020-03-23 NOTE — Progress Notes (Signed)
RT NOTES: Called to room by RN d/t patient desaturating. Patient on 8L HFNC and NRB mask. Increased HFNC to 15L. Sats increasing to 92%. Will continue to monitor.

## 2020-03-23 NOTE — Plan of Care (Signed)
  Problem: Clinical Measurements: Goal: Cardiovascular complication will be avoided Outcome: Progressing   Problem: Nutrition: Goal: Adequate nutrition will be maintained Outcome: Progressing   Problem: Respiratory: Goal: Will maintain a patent airway Outcome: Progressing   Problem: Respiratory: Goal: Complications related to the disease process, condition or treatment will be avoided or minimized Outcome: Progressing   Problem: Clinical Measurements: Goal: Respiratory complications will improve Outcome: Not Progressing  Increase in O2 demand

## 2020-03-23 NOTE — Progress Notes (Signed)
RT NOTES: Patient was on 8L nasal cannula. Changed patient to 8L [place] HFNC. Sats 98%. Will continue to monitor.

## 2020-03-23 NOTE — Progress Notes (Addendum)
Progress Note  Patient Name: Patricia Terrell Date of Encounter: 03/23/2020  [facility] Cardiologist: Dr Antoine PocheHochrein  Subjective   Asked to reassess/reconsult for CHF.  Previously consulted by Dr. Antoine PocheHochrein on February 12.   A and O x 2; complains of mild dyspnea; no CP.   Inpatient Medications    Scheduled Meds: . (feeding supplement) PROSource Plus  30 mL Oral BID BM  . azithromycin  500 mg Oral QHS  . enoxaparin (LOVENOX) injection  30 mg Subcutaneous Q24H  . famotidine  20 mg Oral Daily  . feeding supplement  237 mL Oral TID BM  . furosemide  40 mg Intravenous Daily  . ipratropium-albuterol  3 mL Nebulization TID  . metoprolol tartrate  100 mg Oral BID  . mometasone-formoterol  2 puff Inhalation BID  . multivitamin with minerals  1 tablet Oral Daily  . nicotine  21 mg Transdermal Daily  . polyethylene glycol  17 g Oral BID  . QUEtiapine  25 mg Oral BID  . senna-docusate  1 tablet Oral BID   Continuous Infusions: . ceFEPime (MAXIPIME) IV 2 g (03/23/20 1300)   PRN Meds: acetaminophen, albuterol, alum & mag hydroxide-simeth, haloperidol lactate, labetalol, [DISCONTINUED] ondansetron **OR** ondansetron (ZOFRAN) IV, Resource ThickenUp Clear   Vital Signs    Vitals:   03/23/20 0754 03/23/20 1028 03/23/20 1140 03/23/20 1530  BP:  (!) 167/97 (!) 141/58   Pulse:  70 98   Resp:  20 16   Temp:   98.1 F (36.7 C)   TempSrc:   Oral   SpO2: 92% 91% 100% 98%  Weight:      Height:        Intake/Output Summary (Last 24 hours) at 03/23/2020 1603 Last data filed at 03/23/2020 0900 Gross per 24 hour  Intake 50 ml  Output -  Net 50 ml   Last 3 Weights 02/26/2020 02/16/2020 02/14/2020  Weight (lbs) 105 lb 13.1 oz 105 lb 13.1 oz 106 lb 11.2 oz  Weight (kg) 48 kg 48 kg 48.4 kg     Not on monitor  Physical Exam   GEN: No acute distress.  WD Neck: supple Cardiac: RRR, no murmurs  Respiratory: Diminished BS throughout GI: Soft, nontender, non-distended  MS: Trace to 1+ ankle  edema Neuro:  Nonfocal; dementia; A and O x 2 (person, place). Psych: Normal affect   Labs    High Sensitivity Troponin:   Recent Labs  Lab 02/26/20 0229  TROPONINIHS 16      Chemistry Recent Labs  Lab 03/21/20 0152 03/22/20 0312 03/23/20 0313  NA 135 136 139  K 3.4* 3.6 3.7  CL 86* 88* 84*  CO2 38* 39* 43*  GLUCOSE 109* 111* 104*  BUN 22 18 18   CREATININE 0.83 0.90 0.96  CALCIUM 8.8* 8.7* 9.2  PROT 6.3* 6.2* 6.7  ALBUMIN 2.4* 2.2* 2.3*  AST 33 29 28  ALT 35 32 35  ALKPHOS 106 108 103  BILITOT 0.4 0.6 0.3  GFRNONAA >60 >60 >60  ANIONGAP 11 9 12      Hematology Recent Labs  Lab 03/21/20 0152 03/22/20 0312 03/23/20 0313  WBC 8.0 6.0 6.6  RBC 3.74* 3.60* 3.87  HGB 10.7* 10.3* 11.1*  HCT 34.5* 33.4* 36.5  MCV 92.2 92.8 94.3  MCH 28.6 28.6 28.7  MCHC 31.0 30.8 30.4  RDW 16.6* 16.4* 16.2*  PLT 246 246 258    BNP Recent Labs  Lab 03/19/20 0253 03/22/20 0312 03/23/20 0313  BNP 333.9* 750.6* 740.9*  Radiology    DG CHEST PORT 1 VIEW  Result Date: 03/23/2020 CLINICAL DATA:  Shortness of breath EXAM: PORTABLE CHEST 1 VIEW COMPARISON:  03/22/2020 FINDINGS: Cardiac shadow remains enlarged. Aortic calcifications are again seen. Scattered calcified pleural plaques are again identified bilaterally. Diffuse emphysematous changes are noted as well as persistent consolidation in the right lower lobe stable from prior exams. Given some technical variations in the imaging the overall appearance is relatively stable. No bony abnormality is seen. 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 Underlying interstitial changes and right lower lobe consolidation are noted. Electronically Signed   By: Alcide Clever M.D.   On: 03/23/2020 08:07   DG CHEST PORT 1 VIEW  Result Date: 03/22/2020 CLINICAL DATA:  Shortness of breath and hypoxia. EXAM: PORTABLE CHEST 1 VIEW COMPARISON:  03/21/2020 and CTA of the chest on 03/19/2020 FINDINGS: Stable cardiac enlargement  and aortic tortuosity. Superimposed on chronic lung disease is some increase in interstitial prominence and pulmonary vascularity likely representing pulmonary interstitial edema. Multiple areas of bilateral pleural calcification again noted. No pleural effusions or pneumothorax identified. IMPRESSION: 1. Increase in interstitial prominence and pulmonary vascularity likely representing acute pulmonary interstitial edema superimposed on chronic lung disease. 2. Stable cardiomegaly and aortic tortuosity. Electronically Signed   By: Irish Lack M.D.   On: 03/22/2020 14:23   DG Swallowing Func-Speech Pathology  Result Date: 03/22/2020 Objective Swallowing Evaluation: Type of Study: MBS-Modified Barium Swallow Study  Patient Details Name: Patricia Terrell MRN: [id number] Date of Birth: Dec 10, 1940 Today's Date: 03/22/2020 Time: SLP Start Time (ACUTE ONLY): 1333 -SLP Stop Time (ACUTE ONLY): 1405 SLP Time Calculation (min) (ACUTE ONLY): 32 min Past Medical History: Past Medical History: Diagnosis Date . Emphysema lung (HCC)  . H/O: rheumatic fever  . HTN (hypertension)  . Liver cyst  . PUD (peptic ulcer disease)  Past Surgical History: Past Surgical History: Procedure Laterality Date . CESAREAN SECTION   HPI: Patient is a 80 y.o. female with PMH: dementia, COPD with emphysema, HTN, admitted 1/21-1/24/22 for COPD exacerbation then found to be COVID-19 positive and treated at home. She was then readmitted 2/3 for SOB and was diagnosed with worsening COVID-19 PNA and chronic diastolic CHF and was admitted to hospital during which time she developed worsening encephalopathy and became aggressive; psych was consulted and she has been IVC to be placed in inpatient psych facility. Patient moved from [place] with her daughter one month ago.  Assessment / Plan / Recommendation CHL IP CLINICAL IMPRESSIONS 03/22/2020 Clinical Impression Pt requested to go to the restroom upon arrival to radiology suite. Pt ambulated to the  restroom with nasal cannula in place and on 3L, but it was out of her nose when she was assisted back to the chair and pt reported that she was not feeling well. Vitals were checked and pt's SpO2 was in the low to mid 40s. The unit was contacted by radiology tech and Laymond Purser advised that her oxygen can be increased. It was increased to 5L by SLP and Florentina Addison, RN increased it further to 6L upon arrival to the radiology suite. Pt's SpO2 ultimately improved to 89% and was 90-91% during the study. Pt presented with oropharyngeal dysphagia characterized by impaired bolus cohesion, impaired posterior propulsion of the barium tablet which was ultimately spat out, and a pharyngeal delay. She demonstrated penetration (PAS 3, 5) before and during deglutition and silent aspiration (PAS 8) of thin liquids. A single instance of sensed aspiration (PAS 7) was noted with nectar thick  liquids via straw due to impaired timing. Prompted coughing was occasionally effective, but it was mostly too weak to expel penetrated/aspirated material. Postural modifications were attempted, but pt exhibited difficulty following the necessary commands. Considering reports of pt's suboptimal p.o. intake, a regular texture diet will be continued to maximize the pt's options. Nectar thick liquids are recommended via cup. SLP will follow for dysphagia treatment. SLP Visit Diagnosis Dysphagia, oropharyngeal phase (R13.12) Attention and concentration deficit following -- Frontal lobe and executive function deficit following -- Impact on safety and function Mild aspiration risk   CHL IP TREATMENT RECOMMENDATION 03/22/2020 Treatment Recommendations Therapy as outlined in treatment plan below   Prognosis 03/22/2020 Prognosis for Safe Diet Advancement Good Barriers to Reach Goals -- Barriers/Prognosis Comment -- CHL IP DIET RECOMMENDATION 03/22/2020 SLP Diet Recommendations Regular solids;Nectar thick liquid Liquid Administration via Cup;No straw Medication  Administration Whole meds with puree Compensations Minimize environmental distractions;Slow rate;Small sips/bites Postural Changes Remain semi-upright after after feeds/meals (Comment)   CHL IP OTHER RECOMMENDATIONS 03/22/2020 Recommended Consults -- Oral Care Recommendations Oral care BID Other Recommendations Order thickener from pharmacy   CHL IP FOLLOW UP RECOMMENDATIONS 03/22/2020 Follow up Recommendations (No Data)   CHL IP FREQUENCY AND DURATION 03/22/2020 Speech Therapy Frequency (ACUTE ONLY) min 2x/week Treatment Duration 2 weeks      CHL IP ORAL PHASE 03/22/2020 Oral Phase Impaired Oral - Pudding Teaspoon -- Oral - Pudding Cup -- Oral - Honey Teaspoon -- Oral - Honey Cup -- Oral - Nectar Teaspoon -- Oral - Nectar Cup -- Oral - Nectar Straw -- Oral - Thin Teaspoon -- Oral - Thin Cup Premature spillage;Decreased bolus cohesion Oral - Thin Straw Premature spillage;Decreased bolus cohesion Oral - Puree Premature spillage;Decreased bolus cohesion Oral - Mech Soft -- Oral - Regular Premature spillage Oral - Multi-Consistency -- Oral - Pill Reduced posterior propulsion Oral Phase - Comment --  CHL IP PHARYNGEAL PHASE 03/22/2020 Pharyngeal Phase Impaired Pharyngeal- Pudding Teaspoon -- Pharyngeal -- Pharyngeal- Pudding Cup -- Pharyngeal -- Pharyngeal- Honey Teaspoon -- Pharyngeal -- Pharyngeal- Honey Cup -- Pharyngeal -- Pharyngeal- Nectar Teaspoon -- Pharyngeal -- Pharyngeal- Nectar Cup Delayed swallow initiation-vallecula Pharyngeal -- Pharyngeal- Nectar Straw Delayed swallow initiation-pyriform sinuses;Penetration/Aspiration during swallow Pharyngeal Material enters airway, passes BELOW cords and not ejected out despite cough attempt by patient Pharyngeal- Thin Teaspoon -- Pharyngeal -- Pharyngeal- Thin Cup Delayed swallow initiation-pyriform sinuses;Penetration/Aspiration before swallow;Penetration/Aspiration during swallow Pharyngeal Material enters airway, remains ABOVE vocal cords and not ejected out;Material  enters airway, CONTACTS cords and not ejected out;Material enters airway, passes BELOW cords without attempt by patient to eject out (silent aspiration) Pharyngeal- Thin Straw Delayed swallow initiation-pyriform sinuses;Penetration/Aspiration during swallow Pharyngeal Material enters airway, passes BELOW cords without attempt by patient to eject out (silent aspiration);Material enters airway, remains ABOVE vocal cords and not ejected out;Material enters airway, CONTACTS cords and not ejected out Pharyngeal- Puree Delayed swallow initiation-vallecula Pharyngeal -- Pharyngeal- Mechanical Soft -- Pharyngeal -- Pharyngeal- Regular Delayed swallow initiation-vallecula Pharyngeal -- Pharyngeal- Multi-consistency -- Pharyngeal -- Pharyngeal- Pill -- Pharyngeal -- Pharyngeal Comment --  CHL IP CERVICAL ESOPHAGEAL PHASE 03/22/2020 Cervical Esophageal Phase WFL Pudding Teaspoon -- Pudding Cup -- Honey Teaspoon -- Honey Cup -- Nectar Teaspoon -- Nectar Cup -- Nectar Straw -- Thin Teaspoon -- Thin Cup -- Thin Straw -- Puree -- Mechanical Soft -- Regular -- Multi-consistency -- Pill -- Cervical Esophageal Comment -- Shanika I. Vear Clock, MS, CCC-SLP Acute Rehabilitation Services Office number [phone] Pager [phone] Scheryl Marten 03/22/2020, 3:15 PM  Cardiac Studies   1. Severe concentric LVH with cavity obliteration of mid cavity and apex  with systole consistent with hypertrophic cardiomyopathy Turbulent flow  through LV/LVOT but no significant obstruction at rest. . Left ventricular  ejection fraction, by estimation,  is 70 to 75%. The left ventricle has hyperdynamic function. The left  ventricle has no regional wall motion abnormalities. Left ventricular  diastolic parameters are consistent with Grade I diastolic dysfunction  (impaired relaxation).  2. Right ventricular systolic function is normal. The right ventricular  size is normal. There is normal pulmonary artery systolic  pressure.  3. A small pericardial effusion is present.  4. The mitral valve is abnormal.  5. The inferior vena cava is normal in size with greater than 50%  respiratory variability, suggesting right atrial pressure of 3 mmHg.   Patient Profile     80 y.o. female with past medical history of dementia, COPD, hypertension for evaluation of acute on chronic diastolic congestive heart failure.  Patient admitted January 21-24 with COPD exacerbation and Covid positive.  She was discharged but returned with pneumonia.  Also developed encephalopathy with more aggressive behavior and plan is ultimately inpatient psychiatric facility.  Assessment & Plan    1 acute on chronic diastolic congestive heart failure-minimally volume overloaded on examination.  I agree with continuing Lasix 40 mg daily.  Follow renal function.  Can likely transition to oral Lasix in the next 24 to 48 hours.  2 hypertrophic cardiomyopathy-continue beta-blocker at present dose.  3 Covid pneumonia-continue antibiotics per primary care.  4 history of atrial tachycardia-continue beta-blocker.  Patient now off of telemetry.  5 hypertension-blood pressure mildly elevated.  We will follow and adjust regimen as needed.  6 dementia-we will need follow-up with neuropsych her dementia specialist following discharge. Approximately 45 minutes spent reviewing previous records over the last 3 weeks of hospitalization and evaluating patient. For questions or updates, please contact [facility] Please consult [email or web address] for contact info under        Signed, Olga Millers, MD  03/23/2020, 4:03 PM

## 2020-03-23 NOTE — Progress Notes (Signed)
Pt placed on bipap through [facility]. Pt seems to be tolerating well at this time.

## 2020-03-23 NOTE — Progress Notes (Signed)
  Speech Language Pathology Treatment: Dysphagia  Patient Details Name: Patricia Terrell MRN: [id number] DOB: May 21, 1940 Today's Date: 03/23/2020 Time: 0915-0930 SLP Time Calculation (min) (ACUTE ONLY): 15 min  Assessment / Plan / Recommendation Clinical Impression  SLP followed up to assess diet tolerance, reinforce findings and education from [facility] including aspiration of thin liquids and nectar thick when using a straw; rationale for current diet recommendations. No family at bedside this date. Pt repositioned upright with regular and nectar thick liquid breakfast meal. Pt self fed regular textures and cup sip of nectar thick liquids without overt s/sx of aspiration. Pt with reduced overall PO intake despite encouragement. Swallow precautions posted at bedside, reinforced with staff. Pts current cognitive deficits are barrier for candidacy for respiratory muscle strength training. SLP to follow up.    HPI HPI: Patient is a 80 y.o. female with PMH: dementia, COPD with emphysema, HTN, admitted 1/21-1/24/22 for COPD exacerbation then found to be COVID-19 positive and treated at home. She was then readmitted 2/3 for SOB and was diagnosed with worsening COVID-19 PNA and chronic diastolic CHF and was admitted to hospital during which time she developed worsening encephalopathy and became aggressive; psych was consulted and she has been IVC to be placed in inpatient psych facility. Patient moved from [place] with her daughter one month ago.      SLP Plan  Continue with current plan of care       Recommendations  Diet recommendations: Regular;Nectar-thick liquid Liquids provided via: Cup;No straw Medication Administration: Whole meds with puree Supervision: Patient able to self feed;Full supervision/cueing for compensatory strategies Compensations: Minimize environmental distractions;Slow rate;Small sips/bites Postural Changes and/or Swallow Maneuvers: Seated upright 90 degrees;Upright 30-60 min  after meal                Oral Care Recommendations: Oral care BID Follow up Recommendations: None SLP Visit Diagnosis: Dysphagia, oropharyngeal phase (R13.12) Plan: Continue with current plan of care       GO                Patricia Gal MA, CCC-SLP Acute Rehabilitation Services   03/23/2020, 9:42 AM

## 2020-03-23 NOTE — Plan of Care (Signed)

## 2020-03-23 NOTE — Progress Notes (Addendum)
Sitter came to advise nurse that pt. Removed nasal cannula and refuses to keep [place] on and pulse oximetry. Got pt. Into bed, applied pulse oximetry and O2 sats 58.  was placed back on pt. After much sumbcoming along with non-rebreather. Respiratory theray called. Currently O2 sats 87 with HFNC with non-rebreather. MD notified.

## 2020-03-24 DIAGNOSIS — R600 Localized edema: Secondary | ICD-10-CM | POA: Diagnosis present

## 2020-03-24 DIAGNOSIS — E43 Unspecified severe protein-calorie malnutrition: Secondary | ICD-10-CM

## 2020-03-24 DIAGNOSIS — J9601 Acute respiratory failure with hypoxia: Secondary | ICD-10-CM | POA: Diagnosis not present

## 2020-03-24 DIAGNOSIS — R1310 Dysphagia, unspecified: Secondary | ICD-10-CM

## 2020-03-24 DIAGNOSIS — N183 Chronic kidney disease, stage 3 unspecified: Secondary | ICD-10-CM | POA: Diagnosis present

## 2020-03-24 DIAGNOSIS — G9341 Metabolic encephalopathy: Secondary | ICD-10-CM | POA: Diagnosis not present

## 2020-03-24 DIAGNOSIS — K219 Gastro-esophageal reflux disease without esophagitis: Secondary | ICD-10-CM | POA: Diagnosis present

## 2020-03-24 DIAGNOSIS — I5033 Acute on chronic diastolic (congestive) heart failure: Secondary | ICD-10-CM

## 2020-03-24 DIAGNOSIS — N1831 Chronic kidney disease, stage 3a: Secondary | ICD-10-CM | POA: Diagnosis not present

## 2020-03-24 DIAGNOSIS — F17200 Nicotine dependence, unspecified, uncomplicated: Secondary | ICD-10-CM | POA: Diagnosis present

## 2020-03-24 DIAGNOSIS — E876 Hypokalemia: Secondary | ICD-10-CM

## 2020-03-24 DIAGNOSIS — I422 Other hypertrophic cardiomyopathy: Secondary | ICD-10-CM

## 2020-03-24 DIAGNOSIS — D638 Anemia in other chronic diseases classified elsewhere: Secondary | ICD-10-CM | POA: Diagnosis present

## 2020-03-24 LAB — COMPREHENSIVE METABOLIC PANEL
ALT: 29 U/L (ref 0–44)
AST: 22 U/L (ref 15–41)
Albumin: 2 g/dL — ABNORMAL LOW (ref 3.5–5.0)
Alkaline Phosphatase: 87 U/L (ref 38–126)
Anion gap: 12 (ref 5–15)
BUN: 23 mg/dL (ref 8–23)
CO2: 41 mmol/L — ABNORMAL HIGH (ref 22–32)
Calcium: 9.1 mg/dL (ref 8.9–10.3)
Chloride: 86 mmol/L — ABNORMAL LOW (ref 98–111)
Creatinine, Ser: 1.06 mg/dL — ABNORMAL HIGH (ref 0.44–1.00)
GFR, Estimated: 53 mL/min — ABNORMAL LOW (ref >60)
Glucose, Bld: 142 mg/dL — ABNORMAL HIGH (ref 70–99)
Potassium: 3.1 mmol/L — ABNORMAL LOW (ref 3.5–5.1)
Sodium: 139 mmol/L (ref 135–145)
Total Bilirubin: 0.4 mg/dL (ref 0.3–1.2)
Total Protein: 5.9 g/dL — ABNORMAL LOW (ref 6.5–8.1)

## 2020-03-24 LAB — PHOSPHORUS: Phosphorus: 3.2 mg/dL (ref 2.5–4.6)

## 2020-03-24 LAB — CBC WITH DIFFERENTIAL/PLATELET
Abs Immature Granulocytes: 0.02 10*3/uL (ref 0.00–0.07)
Basophils Absolute: 0 10*3/uL (ref 0.0–0.1)
Basophils Relative: 0 %
Eosinophils Absolute: 0.1 10*3/uL (ref 0.0–0.5)
Eosinophils Relative: 1 %
HCT: 33.5 % — ABNORMAL LOW (ref 36.0–46.0)
Hemoglobin: 10.1 g/dL — ABNORMAL LOW (ref 12.0–15.0)
Immature Granulocytes: 0 %
Lymphocytes Relative: 3 %
Lymphs Abs: 0.2 10*3/uL — ABNORMAL LOW (ref 0.7–4.0)
MCH: 28.5 pg (ref 26.0–34.0)
MCHC: 30.1 g/dL (ref 30.0–36.0)
MCV: 94.4 fL (ref 80.0–100.0)
Monocytes Absolute: 0.3 10*3/uL (ref 0.1–1.0)
Monocytes Relative: 5 %
Neutro Abs: 5.3 10*3/uL (ref 1.7–7.7)
Neutrophils Relative %: 91 %
Platelets: 278 10*3/uL (ref 150–400)
RBC: 3.55 MIL/uL — ABNORMAL LOW (ref 3.87–5.11)
RDW: 16.3 % — ABNORMAL HIGH (ref 11.5–15.5)
WBC: 5.9 10*3/uL (ref 4.0–10.5)
nRBC: 0 % (ref 0.0–0.2)

## 2020-03-24 LAB — MAGNESIUM: Magnesium: 1.9 mg/dL (ref 1.7–2.4)

## 2020-03-24 MED ORDER — POTASSIUM CHLORIDE CRYS ER 20 MEQ PO TBCR
20.0000 meq | EXTENDED_RELEASE_TABLET | Freq: Two times a day (BID) | ORAL | Status: DC
  Administered 2020-03-24 – 2020-04-01 (×17): 20 meq via ORAL
  Filled 2020-03-24 (×17): qty 1

## 2020-03-24 MED ORDER — FUROSEMIDE 20 MG PO TABS
20.0000 mg | ORAL_TABLET | Freq: Every day | ORAL | Status: DC
  Administered 2020-03-25 – 2020-04-02 (×9): 20 mg via ORAL
  Filled 2020-03-24 (×9): qty 1

## 2020-03-24 MED ORDER — AMLODIPINE BESYLATE 5 MG PO TABS
5.0000 mg | ORAL_TABLET | Freq: Every day | ORAL | Status: DC
  Administered 2020-03-24 – 2020-04-02 (×10): 5 mg via ORAL
  Filled 2020-03-24 (×10): qty 1

## 2020-03-24 NOTE — Progress Notes (Signed)
Progress Note    Patricia Terrell  MWU:[id number] DOB: 22-Aug-1940  DOA: 02/24/2020 PCP: Pcp, No    Brief Narrative:   Chief complaint: Follow-up encephalopathy due to covid pneumonia  Medical records reviewed and are as summarized below:  Patricia Terrell is an 80 y.o. female past medical history significant for but not limited to dementia, COPD/emphysema, hypertension as well as other comorbidities was admitted from 02/13/20-02/16/20 for COPD exacerbation and was found to be Covid positive at that time, she was treated with 3 days of remdesivir and discharged home, she came back to the hospital on 02/26/2020 with shortness of breath. This time she was diagnosed with worsening COVID-19 pneumonia along with acute on chronic diastolic CHF and admitted to the hospital. She developed worsening encephalopathy and became aggressive, psych was consulted and currently she has been IVC to be placed to inpatient psych facility.  Of note patient has moved from [place] with her daughter 1 month ago, according to the daughter patient was having some memory issues for a while at [place] she is unaware of having any behavioral issues there.  Assessment/Plan:   Principal Problem: Acute Hypoxic Resp. Failure due to Acute Covid 19 Viral Pneumonitis and acute on chronic diastolic CHF EF 75% on recent echocardiogram in the setting of underlying COPD CT angiogram done 03/19/2020 showed a large right lower lobe airspace opacity consistent with pneumonia.  Patient has completed Covid specific treatment and on 03/23/2020, chest x-ray showed interstitial changes/right lower lobe consolidation.  She has been treated with a course of steroids as well.  She is no longer in quarantine.  Empirically placed on cefepime/azithromycin.  Sputum studies have been negative, as have urinary Legionella antigen and strep pneumonia antigen testing and she continues to have a high oxygen requirement with drop of her SPO2 with activity.   She appears to be euvolemic on physical exam and cardiology has deescalated her Lasix therapy.  Active problems:  Dysphagia Status post speech therapy evaluation and modified barium swallow which was performed 03/22/2020.  Diet per speech therapy recommendations: Regular with nectar thick liquids.  Chronic Hypercarbic Respiratory Failure  Has respiratory acidosis, metabolic alkalosis -> normal pH, compensated.  No current need for BiPAP but could consider if respiratory status deteriorates further.  Mental status appears to be at baseline.  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  Cardiology has been consulted and has recommended low-dose beta-blocker and avoidance of overdiuresis.  Also, avoid Imdur or Norvasc.  Lasix D escalated to 20 mg daily.  Appears euvolemic.   Hyperkalemia/hypokalemia We will continue to monitor her electrolytes and replace potassium as needed.  Added low-dose potassium supplementation today.  Stage IIIa chronic kidney disease Creatinine is consistent with usual baseline values.  Tobacco use disorder Counseled to quit, currently on NicoDerm patch.  Hypertension Blood pressure suboptimally controlled.  We will continue to monitor and adjust medications as needed.  Dementia with acute metabolic encephalopathy Evaluated by psychiatry.  Continue Seroquel with as needed Haldol.  Remains intermittently confused and disoriented.  Prior to admission, had severe agitation and threatening behavior that was new and different from her usual baseline.  Psychiatry recommends follow-up with neuropsych or a dementia specialist post discharge.  Anemia of chronic disease Hemoglobin stable.  Continue to monitor.  Gastroesophageal reflux disease Continue Pepcid.  Lower extremity edema Doppler studies negative for DVT.  Likely due  to  underlying heart failure.  Should improve with diuresis.  Severe protein calorie malnutrition in the setting of chronic illness Dietitian evaluation as noted below.  Patient appears to be underweight with significant fat and muscle store loss.  Continue supplements as outlined. Nutritional status Nutrition Problem: Severe Malnutrition Etiology: chronic illness (COPD/CHF) Signs/Symptoms: severe fat depletion,severe muscle depletion Interventions: Ensure Enlive (each supplement provides 350kcal and 20 grams of protein),MVI,Prostat  Body mass index is 17.61 kg/m.   Family Communication/Anticipated D/C date and plan/Code Status   DVT prophylaxis: Place TED hose Start: 03/12/20 0847 Place TED hose Start: 02/29/20 0918 enoxaparin (LOVENOX) injection 30 mg Start: 02/28/20 1000   Current Level of Care: Med-Surg Code Status: DO NOT RESUSCITATE Family Communication: Daughter updated at the bedside. Disposition Plan: Status is: Inpatient  Remains inpatient appropriate because:Continuing to diurese, still has high oxygen requirement.   Dispo: The patient is from: Home              Anticipated d/c is to: Home              Patient currently is not medically stable to d/c.   Difficult to place patient No    Medical Consultants:    Psychiatry  Cardiology   Anti-Infectives:    Cefepime 03/23/2020--->  Azithromycin 03/19/2020---> 03/23/2020  Rocephin 03/19/2020---> 03/23/2020    Subjective:   Frail, elderly female sitting up.  Denies dyspnea but pulse oximetry readings drop into the 70's and 80's and improve when I coach her to take big deep breaths. Daughter at bedside, reports poor appetite.  Weak, but able to get to the [facility]. No nausea or vomiting.  Reports some left sided flank pain.  Objective:    Vitals:   03/23/20 2305 03/23/20 2340 03/24/20 0437 03/24/20 0752  BP:   (!) 156/88 (!) 187/85  Pulse: (!) 53  (!) 58 (!) 55  Resp: 18  16   Temp:   97.6 F (36.4 C) 98.1 F  (36.7 C)  TempSrc:   Oral Oral  SpO2: 92% 90% 92% 91%  Weight:      Height:        Intake/Output Summary (Last 24 hours) at 03/24/2020 2671 Last data filed at 03/23/2020 1843 Gross per 24 hour  Intake 251.32 ml  Output -  Net 251.32 ml   Filed Weights   02/26/20 1100  Weight: 48 kg    Exam: General: Frail, elderly female with loss of muscle/fat stores. Cardiovascular: Heart sounds show a regular rate, and rhythm. No gallops or rubs. No murmurs. No JVD. Lungs: Diminished breath sounds throughout. No rales, rhonchi or wheezes. Abdomen: Soft, nontender, nondistended with normal active bowel sounds. No masses. No hepatosplenomegaly. Neurological: Alert and oriented to self. Moves all extremities 4 with equal strength. Cranial nerves II through XII grossly intact. Skin: Warm and dry. No rashes or lesions. Extremities: No clubbing or cyanosis. Trace edema. Pedal pulses 2+. Psychiatric: Mood and affect are flat. Insight and judgment are impaired.      Data Reviewed:   I have personally reviewed following labs and imaging studies:  Labs: Labs show the following:   Basic Metabolic Panel: Recent Labs  Lab 03/20/20 0154 03/21/20 0152 03/22/20 0312 03/23/20 0313 03/24/20 0022  NA 135 135 136 139 139  K 3.2* 3.4* 3.6 3.7 3.1*  CL 87* 86* 88* 84* 86*  CO2 38* 38* 39* 43* 41*  GLUCOSE 132* 109* 111* 104* 142*  BUN 23 22 18 18 23   CREATININE 0.95  0.83 0.90 0.96 1.06*  CALCIUM 8.8* 8.8* 8.7* 9.2 9.1  MG 1.9 2.0 1.9 1.9 1.9  PHOS 3.0 2.7 2.9 3.3 3.2   GFR Estimated Creatinine Clearance: 32.6 mL/min (A) (by C-G formula based on SCr of 1.06 mg/dL (H)). Liver Function Tests: Recent Labs  Lab 03/20/20 0154 03/21/20 0152 03/22/20 0312 03/23/20 0313 03/24/20 0022  AST 26 33 29 28 22   ALT 32 35 32 35 29  ALKPHOS 96 106 108 103 87  BILITOT 0.7 0.4 0.6 0.3 0.4  PROT 6.2* 6.3* 6.2* 6.7 5.9*  ALBUMIN 2.3* 2.4* 2.2* 2.3* 2.0*   CBC: Recent Labs  Lab 03/20/20 0154  03/21/20 0152 03/22/20 0312 03/23/20 0313 03/24/20 0022  WBC 6.7 8.0 6.0 6.6 5.9  NEUTROABS 6.1 7.4 5.3 5.8 5.3  HGB 10.4* 10.7* 10.3* 11.1* 10.1*  HCT 33.3* 34.5* 33.4* 36.5 33.5*  MCV 91.0 92.2 92.8 94.3 94.4  PLT 221 246 246 258 278    Microbiology Recent Results (from the past 240 hour(s))  MRSA PCR Screening     Status: None   Collection Time: 03/20/20  6:49 PM   Specimen: Nasopharyngeal  Result Value Ref Range Status   MRSA by PCR NEGATIVE NEGATIVE Final    Comment:        The GeneXpert MRSA Assay (FDA approved for NASAL specimens only), is one component of a comprehensive MRSA colonization surveillance program. It is not intended to diagnose MRSA infection nor to guide or monitor treatment for MRSA infections. Performed at [facility], [address] [place], [place]     Procedures and diagnostic studies:  DG CHEST PORT 1 VIEW  Result Date: 03/23/2020 CLINICAL DATA:  Shortness of breath EXAM: PORTABLE CHEST 1 VIEW COMPARISON:  03/22/2020 FINDINGS: Cardiac shadow remains enlarged. Aortic calcifications are again seen. Scattered calcified pleural plaques are again identified bilaterally. Diffuse emphysematous changes are noted as well as persistent consolidation in the right lower lobe stable from prior exams. Given some technical variations in the imaging the overall appearance is relatively stable. No bony abnormality is seen. 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 Underlying interstitial changes and right lower lobe consolidation are noted. Electronically Signed   By: 03/24/2020 M.D.   On: 03/23/2020 08:07   DG CHEST PORT 1 VIEW  Result Date: 03/22/2020 CLINICAL DATA:  Shortness of breath and hypoxia. EXAM: PORTABLE CHEST 1 VIEW COMPARISON:  03/21/2020 and CTA of the chest on 03/19/2020 FINDINGS: Stable cardiac enlargement and aortic tortuosity. Superimposed on chronic lung disease is some increase in interstitial prominence and  pulmonary vascularity likely representing pulmonary interstitial edema. Multiple areas of bilateral pleural calcification again noted. No pleural effusions or pneumothorax identified. IMPRESSION: 1. Increase in interstitial prominence and pulmonary vascularity likely representing acute pulmonary interstitial edema superimposed on chronic lung disease. 2. Stable cardiomegaly and aortic tortuosity. Electronically Signed   By: 03/21/2020 M.D.   On: 03/22/2020 14:23   DG Swallowing Func-Speech Pathology  Result Date: 03/22/2020 Objective Swallowing Evaluation: Type of Study: MBS-Modified Barium Swallow Study  Patient Details Name: Patricia Terrell MRN: [name] Date of Birth: 1940/04/17 Today's Date: 03/22/2020 Time: SLP Start Time (ACUTE ONLY): 1333 -SLP Stop Time (ACUTE ONLY): 1405 SLP Time Calculation (min) (ACUTE ONLY): 32 min Past Medical History: Past Medical History: Diagnosis Date . Emphysema lung (HCC)  . H/O: rheumatic fever  . HTN (hypertension)  . Liver cyst  . PUD (peptic ulcer disease)  Past Surgical History: Past Surgical History: Procedure Laterality  Date . CESAREAN SECTION   HPI: Patient is a 80 y.o. female with PMH: dementia, COPD with emphysema, HTN, admitted 1/21-1/24/22 for COPD exacerbation then found to be COVID-19 positive and treated at home. She was then readmitted 2/3 for SOB and was diagnosed with worsening COVID-19 PNA and chronic diastolic CHF and was admitted to hospital during which time she developed worsening encephalopathy and became aggressive; psych was consulted and she has been IVC to be placed in inpatient psych facility. Patient moved from [place] with her daughter one month ago.  Assessment / Plan / Recommendation CHL IP CLINICAL IMPRESSIONS 03/22/2020 Clinical Impression Pt requested to go to the restroom upon arrival to radiology suite. Pt ambulated to the restroom with nasal cannula in place and on 3L, but it was out of her nose when she was assisted back to the chair  and pt reported that she was not feeling well. Vitals were checked and pt's SpO2 was in the low to mid 40s. The unit was contacted by radiology tech and Laymond Purser advised that her oxygen can be increased. It was increased to 5L by SLP and Florentina Addison, RN increased it further to 6L upon arrival to the radiology suite. Pt's SpO2 ultimately improved to 89% and was 90-91% during the study. Pt presented with oropharyngeal dysphagia characterized by impaired bolus cohesion, impaired posterior propulsion of the barium tablet which was ultimately spat out, and a pharyngeal delay. She demonstrated penetration (PAS 3, 5) before and during deglutition and silent aspiration (PAS 8) of thin liquids. A single instance of sensed aspiration (PAS 7) was noted with nectar thick liquids via straw due to impaired timing. Prompted coughing was occasionally effective, but it was mostly too weak to expel penetrated/aspirated material. Postural modifications were attempted, but pt exhibited difficulty following the necessary commands. Considering reports of pt's suboptimal p.o. intake, a regular texture diet will be continued to maximize the pt's options. Nectar thick liquids are recommended via cup. SLP will follow for dysphagia treatment. SLP Visit Diagnosis Dysphagia, oropharyngeal phase (R13.12) Attention and concentration deficit following -- Frontal lobe and executive function deficit following -- Impact on safety and function Mild aspiration risk   CHL IP TREATMENT RECOMMENDATION 03/22/2020 Treatment Recommendations Therapy as outlined in treatment plan below   Prognosis 03/22/2020 Prognosis for Safe Diet Advancement Good Barriers to Reach Goals -- Barriers/Prognosis Comment -- CHL IP DIET RECOMMENDATION 03/22/2020 SLP Diet Recommendations Regular solids;Nectar thick liquid Liquid Administration via Cup;No straw Medication Administration Whole meds with puree Compensations Minimize environmental distractions;Slow rate;Small sips/bites  Postural Changes Remain semi-upright after after feeds/meals (Comment)   CHL IP OTHER RECOMMENDATIONS 03/22/2020 Recommended Consults -- Oral Care Recommendations Oral care BID Other Recommendations Order thickener from pharmacy   CHL IP FOLLOW UP RECOMMENDATIONS 03/22/2020 Follow up Recommendations (No Data)   CHL IP FREQUENCY AND DURATION 03/22/2020 Speech Therapy Frequency (ACUTE ONLY) min 2x/week Treatment Duration 2 weeks      CHL IP ORAL PHASE 03/22/2020 Oral Phase Impaired Oral - Pudding Teaspoon -- Oral - Pudding Cup -- Oral - Honey Teaspoon -- Oral - Honey Cup -- Oral - Nectar Teaspoon -- Oral - Nectar Cup -- Oral - Nectar Straw -- Oral - Thin Teaspoon -- Oral - Thin Cup Premature spillage;Decreased bolus cohesion Oral - Thin Straw Premature spillage;Decreased bolus cohesion Oral - Puree Premature spillage;Decreased bolus cohesion Oral - Mech Soft -- Oral - Regular Premature spillage Oral - Multi-Consistency -- Oral - Pill Reduced posterior propulsion Oral Phase - Comment --  CHL  IP PHARYNGEAL PHASE 03/22/2020 Pharyngeal Phase Impaired Pharyngeal- Pudding Teaspoon -- Pharyngeal -- Pharyngeal- Pudding Cup -- Pharyngeal -- Pharyngeal- Honey Teaspoon -- Pharyngeal -- Pharyngeal- Honey Cup -- Pharyngeal -- Pharyngeal- Nectar Teaspoon -- Pharyngeal -- Pharyngeal- Nectar Cup Delayed swallow initiation-vallecula Pharyngeal -- Pharyngeal- Nectar Straw Delayed swallow initiation-pyriform sinuses;Penetration/Aspiration during swallow Pharyngeal Material enters airway, passes BELOW cords and not ejected out despite cough attempt by patient Pharyngeal- Thin Teaspoon -- Pharyngeal -- Pharyngeal- Thin Cup Delayed swallow initiation-pyriform sinuses;Penetration/Aspiration before swallow;Penetration/Aspiration during swallow Pharyngeal Material enters airway, remains ABOVE vocal cords and not ejected out;Material enters airway, CONTACTS cords and not ejected out;Material enters airway, passes BELOW cords without attempt by  patient to eject out (silent aspiration) Pharyngeal- Thin Straw Delayed swallow initiation-pyriform sinuses;Penetration/Aspiration during swallow Pharyngeal Material enters airway, passes BELOW cords without attempt by patient to eject out (silent aspiration);Material enters airway, remains ABOVE vocal cords and not ejected out;Material enters airway, CONTACTS cords and not ejected out Pharyngeal- Puree Delayed swallow initiation-vallecula Pharyngeal -- Pharyngeal- Mechanical Soft -- Pharyngeal -- Pharyngeal- Regular Delayed swallow initiation-vallecula Pharyngeal -- Pharyngeal- Multi-consistency -- Pharyngeal -- Pharyngeal- Pill -- Pharyngeal -- Pharyngeal Comment --  CHL IP CERVICAL ESOPHAGEAL PHASE 03/22/2020 Cervical Esophageal Phase WFL Pudding Teaspoon -- Pudding Cup -- Honey Teaspoon -- Honey Cup -- Nectar Teaspoon -- Nectar Cup -- Nectar Straw -- Thin Teaspoon -- Thin Cup -- Thin Straw -- Puree -- Mechanical Soft -- Regular -- Multi-consistency -- Pill -- Cervical Esophageal Comment -- Shanika I. Vear ClockPhillips, MS, CCC-SLP Acute Rehabilitation Services Office number [phone] Pager [phone] Scheryl MartenShanika I Phillips 03/22/2020, 3:15 PM               Medications:   . (feeding supplement) PROSource Plus  30 mL Oral BID BM  . enoxaparin (LOVENOX) injection  30 mg Subcutaneous Q24H  . famotidine  20 mg Oral Daily  . feeding supplement  237 mL Oral TID BM  . furosemide  40 mg Intravenous Daily  . ipratropium-albuterol  3 mL Nebulization TID  . metoprolol tartrate  100 mg Oral BID  . mometasone-formoterol  2 puff Inhalation BID  . multivitamin with minerals  1 tablet Oral Daily  . nicotine  21 mg Transdermal Daily  . polyethylene glycol  17 g Oral BID  . QUEtiapine  25 mg Oral BID  . senna-docusate  1 tablet Oral BID   Continuous Infusions: . ceFEPime (MAXIPIME) IV 2 g (03/23/20 2318)     LOS: 27 days   Hillery Aldohristina Rama, MD  [facility]   [facility] How to contact the [facility]  Attending or Consulting provider 7A - 7P or covering provider during after hours 7P -7A, for this patient?  1. Check the care team in [facility] and look for a) attending/consulting [facility] provider listed and b) the [facility] team listed 2. Log into [email or web address] and use [facility]'s universal password to access. If you do not have the password, please contact the hospital operator. 3. Locate the [facility] provider you are looking for under [facility] and page to a number that you can be directly reached. 4. If you still have difficulty reaching the provider, please page the [facility] LPDOC (Director on Call) for the Hospitalists listed on amion for assistance.  03/24/2020, 8:26 AM

## 2020-03-24 NOTE — Progress Notes (Signed)
Progress Note  Patient Name: Patricia CiscoBarbara Manon Date of Encounter: 03/24/2020  [facility] Cardiologist: Dr Antoine PocheHochrein  Subjective   Denies dyspnea; no CP  Inpatient Medications    Scheduled Meds: . (feeding supplement) PROSource Plus  30 mL Oral BID BM  . enoxaparin (LOVENOX) injection  30 mg Subcutaneous Q24H  . famotidine  20 mg Oral Daily  . feeding supplement  237 mL Oral TID BM  . furosemide  40 mg Intravenous Daily  . ipratropium-albuterol  3 mL Nebulization TID  . metoprolol tartrate  100 mg Oral BID  . mometasone-formoterol  2 puff Inhalation BID  . multivitamin with minerals  1 tablet Oral Daily  . nicotine  21 mg Transdermal Daily  . polyethylene glycol  17 g Oral BID  . potassium chloride  20 mEq Oral BID  . QUEtiapine  25 mg Oral BID  . senna-docusate  1 tablet Oral BID   Continuous Infusions: . ceFEPime (MAXIPIME) IV 2 g (03/24/20 0828)   PRN Meds: acetaminophen, albuterol, alum & mag hydroxide-simeth, haloperidol lactate, labetalol, [DISCONTINUED] ondansetron **OR** ondansetron (ZOFRAN) IV, Resource ThickenUp Clear   Vital Signs    Vitals:   03/23/20 2305 03/23/20 2340 03/24/20 0437 03/24/20 0752  BP:   (!) 156/88 (!) 187/85  Pulse: (!) 53  (!) 58 (!) 55  Resp: 18  16 18   Temp:   97.6 F (36.4 C) 98.1 F (36.7 C)  TempSrc:   Oral Oral  SpO2: 92% 90% 92% 91%  Weight:      Height:        Intake/Output Summary (Last 24 hours) at 03/24/2020 0850 Last data filed at 03/23/2020 1843 Gross per 24 hour  Intake 251.32 ml  Output --  Net 251.32 ml   Last 3 Weights 02/26/2020 02/16/2020 02/14/2020  Weight (lbs) 105 lb 13.1 oz 105 lb 13.1 oz 106 lb 11.2 oz  Weight (kg) 48 kg 48 kg 48.4 kg     Not on monitor  Physical Exam   GEN: NAD.  WD Neck: supple, no JVD Cardiac: RRR Respiratory: Diminished BS throughout; no wheeze GI: Soft, NT/ND MS: No ankle edema Neuro:  Grossly intact Psych: Normal affect   Labs    High Sensitivity Troponin:   Recent Labs   Lab 02/26/20 0229  TROPONINIHS 16      Chemistry Recent Labs  Lab 03/22/20 0312 03/23/20 0313 03/24/20 0022  NA 136 139 139  K 3.6 3.7 3.1*  CL 88* 84* 86*  CO2 39* 43* 41*  GLUCOSE 111* 104* 142*  BUN 18 18 23   CREATININE 0.90 0.96 1.06*  CALCIUM 8.7* 9.2 9.1  PROT 6.2* 6.7 5.9*  ALBUMIN 2.2* 2.3* 2.0*  AST 29 28 22   ALT 32 35 29  ALKPHOS 108 103 87  BILITOT 0.6 0.3 0.4  GFRNONAA >60 >60 53*  ANIONGAP 9 12 12      Hematology Recent Labs  Lab 03/22/20 0312 03/23/20 0313 03/24/20 0022  WBC 6.0 6.6 5.9  RBC 3.60* 3.87 3.55*  HGB 10.3* 11.1* 10.1*  HCT 33.4* 36.5 33.5*  MCV 92.8 94.3 94.4  MCH 28.6 28.7 28.5  MCHC 30.8 30.4 30.1  RDW 16.4* 16.2* 16.3*  PLT 246 258 278    BNP Recent Labs  Lab 03/19/20 0253 03/22/20 0312 03/23/20 0313  BNP 333.9* 750.6* 740.9*     Radiology    DG CHEST PORT 1 VIEW  Result Date: 03/23/2020 CLINICAL DATA:  Shortness of breath EXAM: PORTABLE CHEST 1 VIEW COMPARISON:  03/22/2020 FINDINGS: Cardiac shadow remains enlarged. Aortic calcifications are again seen. Scattered calcified pleural plaques are again identified bilaterally. Diffuse emphysematous changes are noted as well as persistent consolidation in the right lower lobe stable from prior exams. Given some technical variations in the imaging the overall appearance is relatively stable. No bony abnormality is seen. 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 Underlying interstitial changes and right lower lobe consolidation are noted. Electronically Signed   By: Alcide Clever M.D.   On: 03/23/2020 08:07   DG CHEST PORT 1 VIEW  Result Date: 03/22/2020 CLINICAL DATA:  Shortness of breath and hypoxia. EXAM: PORTABLE CHEST 1 VIEW COMPARISON:  03/21/2020 and CTA of the chest on 03/19/2020 FINDINGS: Stable cardiac enlargement and aortic tortuosity. Superimposed on chronic lung disease is some increase in interstitial prominence and pulmonary vascularity likely  representing pulmonary interstitial edema. Multiple areas of bilateral pleural calcification again noted. No pleural effusions or pneumothorax identified. IMPRESSION: 1. Increase in interstitial prominence and pulmonary vascularity likely representing acute pulmonary interstitial edema superimposed on chronic lung disease. 2. Stable cardiomegaly and aortic tortuosity. Electronically Signed   By: Irish Lack M.D.   On: 03/22/2020 14:23   DG Swallowing Func-Speech Pathology  Result Date: 03/22/2020 Objective Swallowing Evaluation: Type of Study: MBS-Modified Barium Swallow Study  Patient Details Name: Patricia Terrell MRN: [id number] Date of Birth: 03/05/1940 Today's Date: 03/22/2020 Time: SLP Start Time (ACUTE ONLY): 1333 -SLP Stop Time (ACUTE ONLY): 1405 SLP Time Calculation (min) (ACUTE ONLY): 32 min Past Medical History: Past Medical History: Diagnosis Date . Emphysema lung (HCC)  . H/O: rheumatic fever  . HTN (hypertension)  . Liver cyst  . PUD (peptic ulcer disease)  Past Surgical History: Past Surgical History: Procedure Laterality Date . CESAREAN SECTION   HPI: Patient is a 80 y.o. female with PMH: dementia, COPD with emphysema, HTN, admitted 1/21-1/24/22 for COPD exacerbation then found to be COVID-19 positive and treated at home. She was then readmitted 2/3 for SOB and was diagnosed with worsening COVID-19 PNA and chronic diastolic CHF and was admitted to hospital during which time she developed worsening encephalopathy and became aggressive; psych was consulted and she has been IVC to be placed in inpatient psych facility. Patient moved from [place] with her daughter one month ago.  Assessment / Plan / Recommendation CHL IP CLINICAL IMPRESSIONS 03/22/2020 Clinical Impression Pt requested to go to the restroom upon arrival to radiology suite. Pt ambulated to the restroom with nasal cannula in place and on 3L, but it was out of her nose when she was assisted back to the chair and pt reported that she was  not feeling well. Vitals were checked and pt's SpO2 was in the low to mid 40s. The unit was contacted by radiology tech and Laymond Purser advised that her oxygen can be increased. It was increased to 5L by SLP and Florentina Addison, RN increased it further to 6L upon arrival to the radiology suite. Pt's SpO2 ultimately improved to 89% and was 90-91% during the study. Pt presented with oropharyngeal dysphagia characterized by impaired bolus cohesion, impaired posterior propulsion of the barium tablet which was ultimately spat out, and a pharyngeal delay. She demonstrated penetration (PAS 3, 5) before and during deglutition and silent aspiration (PAS 8) of thin liquids. A single instance of sensed aspiration (PAS 7) was noted with nectar thick liquids via straw due to impaired timing. Prompted coughing was occasionally effective, but it was mostly too weak to expel penetrated/aspirated material. Postural modifications were attempted,  but pt exhibited difficulty following the necessary commands. Considering reports of pt's suboptimal p.o. intake, a regular texture diet will be continued to maximize the pt's options. Nectar thick liquids are recommended via cup. SLP will follow for dysphagia treatment. SLP Visit Diagnosis Dysphagia, oropharyngeal phase (R13.12) Attention and concentration deficit following -- Frontal lobe and executive function deficit following -- Impact on safety and function Mild aspiration risk   CHL IP TREATMENT RECOMMENDATION 03/22/2020 Treatment Recommendations Therapy as outlined in treatment plan below   Prognosis 03/22/2020 Prognosis for Safe Diet Advancement Good Barriers to Reach Goals -- Barriers/Prognosis Comment -- CHL IP DIET RECOMMENDATION 03/22/2020 SLP Diet Recommendations Regular solids;Nectar thick liquid Liquid Administration via Cup;No straw Medication Administration Whole meds with puree Compensations Minimize environmental distractions;Slow rate;Small sips/bites Postural Changes Remain semi-upright  after after feeds/meals (Comment)   CHL IP OTHER RECOMMENDATIONS 03/22/2020 Recommended Consults -- Oral Care Recommendations Oral care BID Other Recommendations Order thickener from pharmacy   CHL IP FOLLOW UP RECOMMENDATIONS 03/22/2020 Follow up Recommendations (No Data)   CHL IP FREQUENCY AND DURATION 03/22/2020 Speech Therapy Frequency (ACUTE ONLY) min 2x/week Treatment Duration 2 weeks      CHL IP ORAL PHASE 03/22/2020 Oral Phase Impaired Oral - Pudding Teaspoon -- Oral - Pudding Cup -- Oral - Honey Teaspoon -- Oral - Honey Cup -- Oral - Nectar Teaspoon -- Oral - Nectar Cup -- Oral - Nectar Straw -- Oral - Thin Teaspoon -- Oral - Thin Cup Premature spillage;Decreased bolus cohesion Oral - Thin Straw Premature spillage;Decreased bolus cohesion Oral - Puree Premature spillage;Decreased bolus cohesion Oral - Mech Soft -- Oral - Regular Premature spillage Oral - Multi-Consistency -- Oral - Pill Reduced posterior propulsion Oral Phase - Comment --  CHL IP PHARYNGEAL PHASE 03/22/2020 Pharyngeal Phase Impaired Pharyngeal- Pudding Teaspoon -- Pharyngeal -- Pharyngeal- Pudding Cup -- Pharyngeal -- Pharyngeal- Honey Teaspoon -- Pharyngeal -- Pharyngeal- Honey Cup -- Pharyngeal -- Pharyngeal- Nectar Teaspoon -- Pharyngeal -- Pharyngeal- Nectar Cup Delayed swallow initiation-vallecula Pharyngeal -- Pharyngeal- Nectar Straw Delayed swallow initiation-pyriform sinuses;Penetration/Aspiration during swallow Pharyngeal Material enters airway, passes BELOW cords and not ejected out despite cough attempt by patient Pharyngeal- Thin Teaspoon -- Pharyngeal -- Pharyngeal- Thin Cup Delayed swallow initiation-pyriform sinuses;Penetration/Aspiration before swallow;Penetration/Aspiration during swallow Pharyngeal Material enters airway, remains ABOVE vocal cords and not ejected out;Material enters airway, CONTACTS cords and not ejected out;Material enters airway, passes BELOW cords without attempt by patient to eject out (silent aspiration)  Pharyngeal- Thin Straw Delayed swallow initiation-pyriform sinuses;Penetration/Aspiration during swallow Pharyngeal Material enters airway, passes BELOW cords without attempt by patient to eject out (silent aspiration);Material enters airway, remains ABOVE vocal cords and not ejected out;Material enters airway, CONTACTS cords and not ejected out Pharyngeal- Puree Delayed swallow initiation-vallecula Pharyngeal -- Pharyngeal- Mechanical Soft -- Pharyngeal -- Pharyngeal- Regular Delayed swallow initiation-vallecula Pharyngeal -- Pharyngeal- Multi-consistency -- Pharyngeal -- Pharyngeal- Pill -- Pharyngeal -- Pharyngeal Comment --  CHL IP CERVICAL ESOPHAGEAL PHASE 03/22/2020 Cervical Esophageal Phase WFL Pudding Teaspoon -- Pudding Cup -- Honey Teaspoon -- Honey Cup -- Nectar Teaspoon -- Nectar Cup -- Nectar Straw -- Thin Teaspoon -- Thin Cup -- Thin Straw -- Puree -- Mechanical Soft -- Regular -- Multi-consistency -- Pill -- Cervical Esophageal Comment -- Shanika I. Vear Clock, MS, CCC-SLP Acute Rehabilitation Services Office number [phone] Pager [phone] Scheryl Marten 03/22/2020, 3:15 PM               Cardiac Studies   1. Severe concentric LVH with cavity obliteration of mid cavity and  apex  with systole consistent with hypertrophic cardiomyopathy Turbulent flow  through LV/LVOT but no significant obstruction at rest. . Left ventricular  ejection fraction, by estimation,  is 70 to 75%. The left ventricle has hyperdynamic function. The left  ventricle has no regional wall motion abnormalities. Left ventricular  diastolic parameters are consistent with Grade I diastolic dysfunction  (impaired relaxation).  2. Right ventricular systolic function is normal. The right ventricular  size is normal. There is normal pulmonary artery systolic pressure.  3. A small pericardial effusion is present.  4. The mitral valve is abnormal.  5. The inferior vena cava is normal in size with greater than  50%  respiratory variability, suggesting right atrial pressure of 3 mmHg.   Patient Profile     80 y.o. female with past medical history of dementia, COPD, hypertension for evaluation of acute on chronic diastolic congestive heart failure.  Patient admitted January 21-24 with COPD exacerbation and Covid positive.  She was discharged but returned with pneumonia.  Also developed encephalopathy with more aggressive behavior and plan is ultimately inpatient psychiatric facility.  Assessment & Plan    1 acute on chronic diastolic congestive heart failure-patient appears to be euvolemic on examination.  Change Lasix to 20 mg by mouth daily.  Follow renal function.    2 hypertrophic cardiomyopathy-continue beta-blocker at present dose.  3 Covid pneumonia-continue antibiotics per primary care.  4 history of atrial tachycardia-continue beta-blocker.  Patient now off of telemetry.  5 hypertension-blood pressure remains mildly elevated.  Add amlodipine 5 mg daily.  Follow blood pressure and adjust as needed.  6 dementia-we will need follow-up with neuropsych her dementia specialist following discharge.  For questions or updates, please contact [facility] Please consult [email or web address] for contact info under        Signed, Olga Millers, MD  03/24/2020, 8:50 AM

## 2020-03-24 NOTE — Plan of Care (Signed)
  Problem: Nutrition: Goal: Adequate nutrition will be maintained Outcome: Progressing   Problem: Education: Goal: Knowledge of General Education information will improve Description: Including pain rating scale, medication(s)/side effects and non-pharmacologic comfort measures Outcome: Not Progressing   Problem: Health Behavior/Discharge Planning: Goal: Ability to manage health-related needs will improve Outcome: Not Progressing  A/Ox1-2

## 2020-03-25 DIAGNOSIS — J9601 Acute respiratory failure with hypoxia: Secondary | ICD-10-CM | POA: Diagnosis not present

## 2020-03-25 DIAGNOSIS — G9341 Metabolic encephalopathy: Secondary | ICD-10-CM | POA: Diagnosis not present

## 2020-03-25 DIAGNOSIS — N1831 Chronic kidney disease, stage 3a: Secondary | ICD-10-CM | POA: Diagnosis not present

## 2020-03-25 DIAGNOSIS — I5033 Acute on chronic diastolic (congestive) heart failure: Secondary | ICD-10-CM | POA: Diagnosis not present

## 2020-03-25 LAB — BLOOD GAS, ARTERIAL
Acid-Base Excess: 21.6 mmol/L — ABNORMAL HIGH (ref 0.0–2.0)
Bicarbonate: 48 mmol/L — ABNORMAL HIGH (ref 20.0–28.0)
FIO2: 40
O2 Saturation: 79.5 %
Patient temperature: 37
pCO2 arterial: 79.9 mmHg (ref 32.0–48.0)
pH, Arterial: 7.396 (ref 7.350–7.450)
pO2, Arterial: 48.1 mmHg — ABNORMAL LOW (ref 83.0–108.0)

## 2020-03-25 LAB — BASIC METABOLIC PANEL
Anion gap: 12 (ref 5–15)
BUN: 24 mg/dL — ABNORMAL HIGH (ref 8–23)
CO2: 41 mmol/L — ABNORMAL HIGH (ref 22–32)
Calcium: 9.7 mg/dL (ref 8.9–10.3)
Chloride: 89 mmol/L — ABNORMAL LOW (ref 98–111)
Creatinine, Ser: 0.93 mg/dL (ref 0.44–1.00)
GFR, Estimated: >60 mL/min (ref >60)
Glucose, Bld: 109 mg/dL — ABNORMAL HIGH (ref 70–99)
Potassium: 3.7 mmol/L (ref 3.5–5.1)
Sodium: 142 mmol/L (ref 135–145)

## 2020-03-25 NOTE — Progress Notes (Signed)
  Speech Language Pathology Treatment: Dysphagia  Patient Details Name: Patricia Terrell MRN: [id number] DOB: 08/13/1940 Today's Date: 03/25/2020 Time: 3614-4315 SLP Time Calculation (min) (ACUTE ONLY): 16 min  Assessment / Plan / Recommendation Clinical Impression  Pt was seen for dysphagia treatment with her sitter present. Pt's RN, Darin Engels, reported that the pt has not been eating much, but has been tolerating the solids and liquids without overt s/sx of aspiration. Pt was seen with part of breakfast. She consumed a banana, oatmeal and thin liquids. No s/sx of aspiration were noted, but pt does have a history of silent aspiration. Mastication was [facility] and mild oral residue was cleared with a liquid wash. Pt was educated on dysphagia exercises and on use of an effortful swallow. She completed multiple reps with varying visual effort and pt was unable to rate her perception of effort. It is recommended that her current diet of regular texture solids and nectar thick liquids be continued and SLP will continue to follow pt.    HPI HPI: Patient is a 80 y.o. female with PMH: dementia, COPD with emphysema, HTN, admitted 1/21-1/24/22 for COPD exacerbation then found to be COVID-19 positive and treated at home. She was then readmitted 2/3 for SOB and was diagnosed with worsening COVID-19 PNA and chronic diastolic CHF and was admitted to hospital during which time she developed worsening encephalopathy and became aggressive; psych was consulted and she has been IVC to be placed in inpatient psych facility. Patient moved from [place] with her daughter one month ago.      SLP Plan  Continue with current plan of care       Recommendations  Diet recommendations: Regular;Nectar-thick liquid Liquids provided via: Cup Medication Administration: Whole meds with puree Supervision: Patient able to self feed;Full supervision/cueing for compensatory strategies Compensations: Minimize environmental  distractions;Slow rate;Small sips/bites Postural Changes and/or Swallow Maneuvers: Seated upright 90 degrees;Upright 30-60 min after meal                Oral Care Recommendations: Oral care BID Follow up Recommendations: None SLP Visit Diagnosis: Dysphagia, oropharyngeal phase (R13.12) Plan: Continue with current plan of care       Bastien Strawser I. Vear Clock, MS, CCC-SLP Acute Rehabilitation Services Office number [phone] Pager [phone]                Scheryl Marten 03/25/2020, 9:56 AM

## 2020-03-25 NOTE — Progress Notes (Signed)
Date and time results received: 03/25/20 15:06pm (use smartphrase ".now" to insert current time)  Test: ABG Critical Value: CO2 79.9  Name of Provider Notified: Hillery Aldo MD  Orders Received? Or Actions Taken?:  BIPAP

## 2020-03-25 NOTE — Progress Notes (Signed)
Progress Note    Patricia Terrell  HER:[id number] DOB: 11/11/1940  DOA: 02/24/2020 PCP: Pcp, No    Brief Narrative:   Chief complaint: Follow-up encephalopathy due to covid pneumonia  Medical records reviewed and are as summarized below:  Patricia Terrell is an 80 y.o. female past medical history significant for but not limited to dementia, COPD/emphysema, hypertension as well as other comorbidities was admitted from 02/13/20-02/16/20 for COPD exacerbation and was found to be Covid positive at that time, she was treated with 3 days of remdesivir and discharged home, she came back to the hospital on 02/26/2020 with shortness of breath. This time she was diagnosed with worsening COVID-19 pneumonia along with acute on chronic diastolic CHF and admitted to the hospital. She developed worsening encephalopathy and became aggressive, psych was consulted and currently she has been IVC to be placed to inpatient psych facility.  Of note patient has moved from [place] with her daughter 1 month ago, according to the daughter patient was having some memory issues for a while at [place] she is unaware of having any behavioral issues there.  Assessment/Plan:   Principal Problem: Acute Hypoxic and hypercarbic Resp. Failure due to Acute Covid 19 Viral Pneumonitis and acute on chronic diastolic CHF EF 75% on recent echocardiogram in the setting of underlying COPD CT angiogram done 03/19/2020 showed a large right lower lobe airspace opacity consistent with pneumonia.  Patient has completed Covid specific treatment and on 03/23/2020, chest x-ray showed interstitial changes/right lower lobe consolidation.  She has been treated with a course of steroids as well.  She is no longer in quarantine.  Empirically placed on cefepime/azithromycin.  Sputum studies have been negative, as have urinary Legionella antigen and strep pneumonia antigen testing and she continues to have a high oxygen requirement with drop of her  SPO2 with activity.  More lethargic today, STAT ABG done: pO2 48.1, pCO2 79.9.  Will place on Bipap.  Active problems:  Dysphagia Status post speech therapy evaluation and modified barium swallow which was performed 03/22/2020.  Diet per speech therapy recommendations: Regular with nectar thick liquids.  Chronic Hypercarbic Respiratory Failure  Has respiratory acidosis, metabolic alkalosis -> normal pH, compensated.  No current need for BiPAP but could consider if respiratory status deteriorates further.  Mental status appears to be at baseline.  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  Cardiology has been consulted and has recommended low-dose beta-blocker and avoidance of overdiuresis.  Also, avoid Imdur or Norvasc.  Lasix D escalated to 20 mg daily.  Appears euvolemic.   Hyperkalemia/hypokalemia We will continue to monitor her electrolytes and replace potassium as needed.  Added low-dose potassium supplementation today.  Stage IIIa chronic kidney disease Creatinine is consistent with usual baseline values.  Tobacco use disorder Counseled to quit, currently on NicoDerm patch.  Hypertension Blood pressure suboptimally controlled.  We will continue to monitor and adjust medications as needed.  Dementia with acute metabolic encephalopathy Evaluated by psychiatry.  Continue Seroquel with as needed Haldol.  Remains intermittently confused and disoriented.  Prior to admission, had severe agitation and threatening behavior that was new and different from her usual baseline.  Psychiatry recommends follow-up with neuropsych or a dementia specialist post discharge.  Anemia of chronic disease Hemoglobin stable.  Continue to monitor.  Gastroesophageal reflux disease Continue Pepcid.  Lower extremity edema Doppler studies negative for DVT.  Likely due to  underlying heart failure.  Should improve with diuresis.  Severe protein calorie malnutrition in the setting of chronic illness Dietitian evaluation as noted below.  Patient appears to be underweight with significant fat and muscle store loss.  Continue supplements as outlined. Nutritional status Nutrition Problem: Severe Malnutrition Etiology: chronic illness (COPD/CHF) Signs/Symptoms: severe fat depletion,severe muscle depletion Interventions: Ensure Enlive (each supplement provides 350kcal and 20 grams of protein),MVI,Prostat  Body mass index is 17.61 kg/m.   Family Communication/Anticipated D/C date and plan/Code Status   DVT prophylaxis: Place TED hose Start: 03/12/20 0847 Place TED hose Start: 02/29/20 0918 enoxaparin (LOVENOX) injection 30 mg Start: 02/28/20 1000   Current Level of Care: Med-Surg Code Status: DO NOT RESUSCITATE Family Communication: Daughter updated at the bedside 03/24/20, no answer on phone 03/25/20. Disposition Plan: Status is: Inpatient  Remains inpatient appropriate because:Continuing to diurese, still has high oxygen requirement.   Dispo: The patient is from: Home              Anticipated d/c is to: Home              Patient currently is not medically stable to d/c.   Difficult to place patient No    Medical Consultants:    Psychiatry  Cardiology   Anti-Infectives:    Cefepime 03/23/2020--->  Azithromycin 03/19/2020---> 03/23/2020  Rocephin 03/19/2020---> 03/23/2020    Subjective:   More lethargic today, less talkative. Confused.  Objective:    Vitals:   03/24/20 0752 03/24/20 1618 03/24/20 2050 03/25/20 0748  BP: (!) 187/85  (!) 159/81 (!) 164/80  Pulse: (!) 55  81 75  Resp: 18  18 18   Temp: 98.1 F (36.7 C)  98.4 F (36.9 C) 98.1 F (36.7 C)  TempSrc: Oral  Oral Oral  SpO2: 91% 100% 90% (!) 88%  Weight:      Height:        Intake/Output Summary (Last 24 hours) at 03/25/2020 05/25/2020 Last data filed at 03/24/2020 1633 Gross per  24 hour  Intake 478 ml  Output -  Net 478 ml   Filed Weights   02/26/20 1100  Weight: 48 kg    Exam: General: Frail, elderly female with loss of muscle/fat stores. Lethargic today.  Cardiovascular: Heart sounds show a regular rate, and rhythm. No gallops or rubs. 2/6 murmur. No JVD. Lungs: Diminished breath sounds throughout. No rales, rhonchi or wheezes. Abdomen: Soft, nontender, nondistended with normal active bowel sounds. No masses. No hepatosplenomegaly. Neurological: Lethargic. Skin: Warm and dry. No rashes or lesions. Extremities: No clubbing or cyanosis. Trace edema. Pedal pulses 2+.      Data Reviewed:   I have personally reviewed following labs and imaging studies:  Labs: Labs show the following:   Basic Metabolic Panel: Recent Labs  Lab 03/20/20 0154 03/21/20 0152 03/22/20 0312 03/23/20 0313 03/24/20 0022 03/25/20 0226  NA 135 135 136 139 139 142  K 3.2* 3.4* 3.6 3.7 3.1* 3.7  CL 87* 86* 88* 84* 86* 89*  CO2 38* 38* 39* 43* 41* 41*  GLUCOSE 132* 109* 111* 104* 142* 109*  BUN 23 22 18 18 23  24*  CREATININE 0.95 0.83 0.90 0.96 1.06* 0.93  CALCIUM 8.8* 8.8* 8.7* 9.2 9.1 9.7  MG 1.9 2.0 1.9 1.9 1.9  --   PHOS 3.0 2.7 2.9 3.3 3.2  --    GFR Estimated Creatinine Clearance: 37.2 mL/min (by C-G formula based on SCr of 0.93 mg/dL). Liver Function Tests: Recent Labs  Lab 03/20/20 0154 03/21/20 0152 03/22/20 0312 03/23/20 0313 03/24/20 0022  AST 26 33 29 28 22   ALT 32 35 32 35 29  ALKPHOS 96 106 108 103 87  BILITOT 0.7 0.4 0.6 0.3 0.4  PROT 6.2* 6.3* 6.2* 6.7 5.9*  ALBUMIN 2.3* 2.4* 2.2* 2.3* 2.0*   CBC: Recent Labs  Lab 03/20/20 0154 03/21/20 0152 03/22/20 0312 03/23/20 0313 03/24/20 0022  WBC 6.7 8.0 6.0 6.6 5.9  NEUTROABS 6.1 7.4 5.3 5.8 5.3  HGB 10.4* 10.7* 10.3* 11.1* 10.1*  HCT 33.3* 34.5* 33.4* 36.5 33.5*  MCV 91.0 92.2 92.8 94.3 94.4  PLT 221 246 246 258 278    Microbiology Recent Results (from the past 240 hour(s))  MRSA PCR  Screening     Status: None   Collection Time: 03/20/20  6:49 PM   Specimen: Nasopharyngeal  Result Value Ref Range Status   MRSA by PCR NEGATIVE NEGATIVE Final    Comment:        The GeneXpert MRSA Assay (FDA approved for NASAL specimens only), is one component of a comprehensive MRSA colonization surveillance program. It is not intended to diagnose MRSA infection nor to guide or monitor treatment for MRSA infections. Performed at [facility], [address] [place], [place]     Procedures and diagnostic studies:  No results found.  Medications:   . (feeding supplement) PROSource Plus  30 mL Oral BID BM  . amLODipine  5 mg Oral Daily  . enoxaparin (LOVENOX) injection  30 mg Subcutaneous Q24H  . famotidine  20 mg Oral Daily  . feeding supplement  237 mL Oral TID BM  . furosemide  20 mg Oral Daily  . ipratropium-albuterol  3 mL Nebulization TID  . metoprolol tartrate  100 mg Oral BID  . mometasone-formoterol  2 puff Inhalation BID  . multivitamin with minerals  1 tablet Oral Daily  . nicotine  21 mg Transdermal Daily  . polyethylene glycol  17 g Oral BID  . potassium chloride  20 mEq Oral BID  . QUEtiapine  25 mg Oral BID  . senna-docusate  1 tablet Oral BID   Continuous Infusions: . ceFEPime (MAXIPIME) IV 2 g (03/24/20 2158)     LOS: 28 days   2159, MD  [facility]   [facility] How to contact the [facility] Attending or Consulting provider 7A - 7P or covering provider during after hours 7P -7A, for this patient?  1. Check the care team in [facility] and look for a) attending/consulting [facility] provider listed and b) the [facility] team listed 2. Log into [email or web address] and use [facility] universal password to access. If you do not have the password, please contact the hospital operator. 3. Locate the [facility] provider you are looking for under [facility] and page to a number that you can be directly reached. 4. If you still have difficulty  reaching the provider, please page the [facility] (Director on Call) for the Hospitalists listed on amion for assistance.  03/25/2020, 8:23 AM
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
## 2020-03-25 NOTE — Progress Notes (Signed)
   03/25/20 1546  BiPAP/CPAP/SIPAP  BiPAP/CPAP/SIPAP Pt Type Adult (Pt wouldn't wear the bipap even with coaching from her daughter.  She refused, RT had a hard time getting the oxygen back on her as she refused it as well.  Finally, with the mitten restraints, pt was able to tolerate the [place] at 10 L.  BIPAP on standby.)

## 2020-03-25 NOTE — Plan of Care (Signed)
  Problem: Skin Integrity: Goal: Risk for impaired skin integrity will decrease Outcome: Progressing   Problem: Respiratory: Goal: Will maintain a patent airway Outcome: Progressing   Problem: Respiratory: Goal: Complications related to the disease process, condition or treatment will be avoided or minimized Outcome: Progressing   

## 2020-03-25 NOTE — Progress Notes (Signed)
Progress Note  Patient Name: Patricia Terrell Date of Encounter: 03/25/2020  [facility] Cardiologist: Dr Antoine Poche  Subjective   No CP or dyspnea  Inpatient Medications    Scheduled Meds: . (feeding supplement) PROSource Plus  30 mL Oral BID BM  . amLODipine  5 mg Oral Daily  . enoxaparin (LOVENOX) injection  30 mg Subcutaneous Q24H  . famotidine  20 mg Oral Daily  . feeding supplement  237 mL Oral TID BM  . furosemide  20 mg Oral Daily  . ipratropium-albuterol  3 mL Nebulization TID  . metoprolol tartrate  100 mg Oral BID  . mometasone-formoterol  2 puff Inhalation BID  . multivitamin with minerals  1 tablet Oral Daily  . nicotine  21 mg Transdermal Daily  . polyethylene glycol  17 g Oral BID  . potassium chloride  20 mEq Oral BID  . QUEtiapine  25 mg Oral BID  . senna-docusate  1 tablet Oral BID   Continuous Infusions: . ceFEPime (MAXIPIME) IV 2 g (03/25/20 0902)   PRN Meds: acetaminophen, albuterol, alum & mag hydroxide-simeth, haloperidol lactate, labetalol, [DISCONTINUED] ondansetron **OR** ondansetron (ZOFRAN) IV, Resource ThickenUp Clear   Vital Signs    Vitals:   03/24/20 1618 03/24/20 2050 03/25/20 0748 03/25/20 0903  BP:  (!) 159/81 (!) 164/80   Pulse:  81 75   Resp:  18 18   Temp:  98.4 F (36.9 C) 98.1 F (36.7 C)   TempSrc:  Oral Oral   SpO2: 100% 90% (!) 88% (!) 89%  Weight:      Height:        Intake/Output Summary (Last 24 hours) at 03/25/2020 0930 Last data filed at 03/24/2020 1633 Gross per 24 hour  Intake 358 ml  Output --  Net 358 ml   Last 3 Weights 02/26/2020 02/16/2020 02/14/2020  Weight (lbs) 105 lb 13.1 oz 105 lb 13.1 oz 106 lb 11.2 oz  Weight (kg) 48 kg 48 kg 48.4 kg     Not on monitor  Physical Exam   GEN: WD, frail Neck: supple Cardiac: RRR, no gallop Respiratory: CTA GI: Soft, NT/ND, no masses MS: No edema Neuro:  No focal findings   Labs    High Sensitivity Troponin:   Recent Labs  Lab 02/26/20 0229   TROPONINIHS 16      Chemistry Recent Labs  Lab 03/22/20 0312 03/23/20 0313 03/24/20 0022 03/25/20 0226  NA 136 139 139 142  K 3.6 3.7 3.1* 3.7  CL 88* 84* 86* 89*  CO2 39* 43* 41* 41*  GLUCOSE 111* 104* 142* 109*  BUN 18 18 23  24*  CREATININE 0.90 0.96 1.06* 0.93  CALCIUM 8.7* 9.2 9.1 9.7  PROT 6.2* 6.7 5.9*  --   ALBUMIN 2.2* 2.3* 2.0*  --   AST 29 28 22   --   ALT 32 35 29  --   ALKPHOS 108 103 87  --   BILITOT 0.6 0.3 0.4  --   GFRNONAA >60 >60 53* >60  ANIONGAP 9 12 12 12      Hematology Recent Labs  Lab 03/22/20 0312 03/23/20 0313 03/24/20 0022  WBC 6.0 6.6 5.9  RBC 3.60* 3.87 3.55*  HGB 10.3* 11.1* 10.1*  HCT 33.4* 36.5 33.5*  MCV 92.8 94.3 94.4  MCH 28.6 28.7 28.5  MCHC 30.8 30.4 30.1  RDW 16.4* 16.2* 16.3*  PLT 246 258 278    BNP Recent Labs  Lab 03/19/20 0253 03/22/20 0312 03/23/20 0313  BNP 333.9*  750.6* 740.9*     Cardiac Studies   1. Severe concentric LVH with cavity obliteration of mid cavity and apex  with systole consistent with hypertrophic cardiomyopathy Turbulent flow  through LV/LVOT but no significant obstruction at rest. . Left ventricular  ejection fraction, by estimation,  is 70 to 75%. The left ventricle has hyperdynamic function. The left  ventricle has no regional wall motion abnormalities. Left ventricular  diastolic parameters are consistent with Grade I diastolic dysfunction  (impaired relaxation).  2. Right ventricular systolic function is normal. The right ventricular  size is normal. There is normal pulmonary artery systolic pressure.  3. A small pericardial effusion is present.  4. The mitral valve is abnormal.  5. The inferior vena cava is normal in size with greater than 50%  respiratory variability, suggesting right atrial pressure of 3 mmHg.   Patient Profile     80 y.o. female with past medical history of dementia, COPD, hypertension for evaluation of acute on chronic diastolic congestive heart  failure.  Patient admitted January 21-24 with COPD exacerbation and Covid positive.  She was discharged but returned with pneumonia.  Also developed encephalopathy with more aggressive behavior and plan is ultimately inpatient psychiatric facility.  Assessment & Plan    1 acute on chronic diastolic congestive heart failure-patient appears to be euvolemic on examination.  Will continue low dose diuretic and follow renal function.   2 hypertrophic cardiomyopathy-continue beta-blocker at present dose.  3 Covid pneumonia-continue antibiotics per primary care.  4 history of atrial tachycardia-continue beta-blocker.  Patient now off of telemetry.  5 hypertension-blood pressure remains mildly elevated.  Amlodipine added yesterday.  Would follow and increase as needed.  6 dementia-we will need follow-up with neuropsych her dementia specialist following discharge.  Cardiology will sign off.  Would continue present medications at discharge.  Check potassium and renal function 1 week after discharge.  Follow-up Dr. Antoine Poche 6 to 8 weeks.  Please call with questions.  For questions or updates, please contact [facility] Please consult [email or web address] for contact info under        Signed, Olga Millers, MD  03/25/2020, 9:30 AM

## 2020-03-26 ENCOUNTER — Other Ambulatory Visit: Payer: Self-pay | Admitting: Physician Assistant

## 2020-03-26 DIAGNOSIS — I5033 Acute on chronic diastolic (congestive) heart failure: Secondary | ICD-10-CM

## 2020-03-26 DIAGNOSIS — J9602 Acute respiratory failure with hypercapnia: Secondary | ICD-10-CM | POA: Diagnosis not present

## 2020-03-26 DIAGNOSIS — J9601 Acute respiratory failure with hypoxia: Secondary | ICD-10-CM | POA: Diagnosis not present

## 2020-03-26 LAB — GLUCOSE, CAPILLARY: Glucose-Capillary: 98 mg/dL (ref 70–99)

## 2020-03-26 MED ORDER — SACCHAROMYCES BOULARDII 250 MG PO CAPS
250.0000 mg | ORAL_CAPSULE | Freq: Two times a day (BID) | ORAL | Status: DC
  Administered 2020-03-26 – 2020-04-02 (×14): 250 mg via ORAL
  Filled 2020-03-26 (×15): qty 1

## 2020-03-26 MED ORDER — GUAIFENESIN ER 600 MG PO TB12: 600.0000 mg | ORAL_TABLET | Freq: Two times a day (BID) | ORAL | Status: DC

## 2020-03-26 MED ORDER — GUAIFENESIN 100 MG/5ML PO SOLN
30.0000 mL | Freq: Two times a day (BID) | ORAL | Status: DC
  Administered 2020-03-26 – 2020-04-02 (×15): 600 mg via ORAL
  Filled 2020-03-26 (×3): qty 15
  Filled 2020-03-26: qty 30
  Filled 2020-03-26: qty 15
  Filled 2020-03-26 (×3): qty 30
  Filled 2020-03-26: qty 15
  Filled 2020-03-26: qty 30
  Filled 2020-03-26: qty 45
  Filled 2020-03-26 (×5): qty 15

## 2020-03-26 MED ORDER — SENNOSIDES-DOCUSATE SODIUM 8.6-50 MG PO TABS
1.0000 | ORAL_TABLET | Freq: Every day | ORAL | Status: DC
  Administered 2020-03-27 – 2020-04-01 (×6): 1 via ORAL
  Filled 2020-03-26 (×7): qty 1

## 2020-03-26 NOTE — Progress Notes (Signed)
   03/26/20 1056  Chest Physiotherapy Tx  $ Chest Physiotherapy (CPT) Initial (Pt wouldn't follow directions with the flutter valve, unable to perform. Flutter valve left at bedside, caregiver also given instructions along with a sputum specimen cup. No cough given from pt.)  $ Expiratory Vibration Device Administration  Initial  CPT Delivery Source Flutter valve

## 2020-03-26 NOTE — Progress Notes (Signed)
Pt refused bipap tonight. Pt stable on 6L [place].

## 2020-03-26 NOTE — Progress Notes (Addendum)
PROGRESS NOTE    Patricia Terrell  ZOX:[id number] DOB: 08/30/1940 DOA: 02/24/2020 PCP: Pcp, No    Chief Complaint  Patient presents with  . Psychiatric Evaluation  . Altered Mental Status    Brief Narrative:  Patricia Terrell is an 80 y.o. female past medical history significant for but not limited to dementia, COPD/emphysema, hypertension as well as other comorbidities was admitted from 02/13/20-02/16/20 for COPD exacerbation and was found to be Covid positive at that time, she was treated with 3 days of remdesivir and discharged home, she came back to the hospital on 02/26/2020 with shortness of breath. This time she was diagnosed with worsening COVID-19 pneumonia along with acute on chronic diastolic CHF and admitted to the hospital. She developed worsening encephalopathy and became aggressive, psych was consulted and currently she has been IVC to be placed to inpatient psych facility, patient has improved , and cleared by psychiatry after psychiatry reeval on February 24 , continue to have safety sitter at bedside for frequent reorientation and fall prevention.  Of note patient has moved from [place] with her daughter 1 month ago, according to the daughter patient was having some memory issues for a while at [place] she is unaware of having any behavioral issues there.  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)   Dysphagia   Hypertrophic cardiomyopathy  (HCC)   Hypokalemia   CKD (chronic kidney disease), stage III (HCC)   Tobacco use disorder   Acute metabolic encephalopathy   Anemia of chronic disease   GERD (gastroesophageal reflux disease)   Bilateral lower extremity edema  Acute hypoxic respiratory failure due to COVID-19 pneumonitis and acute on chronic diastolic CHF exacerbation  She also  has chronic CO2 retention with advanced emphysema (CTA chest personally revealed) CTA chest, personally reviewed also showed large right lower lobe airspace opacity consistent with lobar pneumonia Urine strep pneumo antigen negative, urine Legionella antigen negative, MRSA screening negative, initial procalcitonin less than 0.1, no repeat procalcitonin, does not appear sputum sample has been collected, I did not hear any productive cough today during encounter She was treated with Rocephin and Zithromax for 5 days from 2/25 to 3/1, then cefepime from 3/1, there is no fever, no leukocytosis, She also received iv lasix , now on oral lasix Today she is on HFNC at 5liters, does not appear in respiratory distress, will continue cefepime and oral Lasix, check inflammatory markers, may consider short course of steroid if inflammatory marker remains elevated Wean oxygen as tolerated, keep O2 above 88%  Dysphagia -modified barium swallow which was performed 03/22/2020 -Currently on regular diet with nectar thick liquid per speech eval -Continue aspiration precaution  Gastroesophageal reflux disease Continue Pepcid.  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  Cardiology has been consulted and has recommended low-dose beta-blocker and avoidance of overdiuresis.  Also, avoid Imdur or Norvasc.  Lasix Deescalated to 20 mg daily.  Appears euvolemic   Lower extremity edema Doppler studies negative for DVT.  Likely due to  underlying heart failure.  3/4 exam with trace ankle and pedal edema, no significant leg edema, elevate leg  Hypokalemia/hypomagnesemia Continue replace, keep K above 4, mag above 2  CKD 2/anemia of chronic disease Creatinine and hemoglobin close to baseline Monitor, renal dosing meds  Hypertension Currently on Norvasc, Lasix, Lopressor, monitor, adjust meds as needed  Tobacco use disorder She was started on NicoDerm 21mg  on February 2, plan for total of 6 weeks then transition to 14 mg for 2 weeks then transition to 7 mg for 2 weeks then stop   Acute metabolic encephalopathy with baseline dementia Patient initially has homicidal ideation was evaluated by psychiatry initial recommendation for inpatient Geri psych placement, patient has improved, psychiatry cleared patient on reeval  on February 24  Continue Seroquel , continue sitter for frequent reorientation and fall risk prevention  Nutritional Assessment: The patient's BMI is: Body mass index is 17.1 kg/m.February 26 Seen by dietician.  I agree with the assessment and plan as outlined below: Nutrition Status: Nutrition Problem: Severe Malnutrition Etiology: chronic illness (COPD/CHF) Signs/Symptoms: severe fat depletion,severe muscle depletion Interventions: Ensure Enlive (each supplement provides 350kcal and 20 grams of protein),MVI,Prostat  .   Unresulted Labs (From admission, onward)          Start     Ordered   03/27/20 0500  C-reactive protein  Daily,   R     Question:  Specimen collection method  Answer:  Lab=Lab collect   03/26/20 0921   03/27/20 0500  D-dimer, quantitative  Daily,   R      03/26/20 0921   03/27/20 0500  Brain natriuretic peptide  Daily,   R     Question:  Specimen collection method  Answer:  Lab=Lab collect   03/26/20 0921   03/27/20 0500  Basic metabolic panel  Daily,   R     Question:  Specimen collection method  Answer:  Lab=Lab collect   03/26/20 0921   03/27/20 0500  Magnesium  Tomorrow morning,    R       Question:  Specimen collection method  Answer:  Lab=Lab collect   03/26/20 0921   03/27/20 0500  Procalcitonin  Daily,   R     Question:  Specimen collection method  Answer:  Lab=Lab collect   03/26/20 05/26/20   03/26/20 0924  Expectorated Sputum Assessment w Gram Stain, Rflx to Resp Cult  Once,   R        03/26/20 05/26/20   Pending  CBC  Tomorrow morning,   R       Question:  Specimen collection method  Answer:  Lab=Lab collect   Pending   Pending  Basic metabolic panel  Tomorrow morning,   R       Question:  Specimen collection method  Answer:  Lab=Lab collect   Pending            DVT prophylaxis: Place TED hose Start: 03/12/20 0847 Place TED hose Start: 02/29/20 0918 enoxaparin (LOVENOX) injection 30 mg Start: 02/28/20 1000   Code Status:DNR Family Communication: Daughter updated over the phone Disposition:   Status is: Inpatient  Dispo: The patient is from: home              Anticipated d/c is to: Daughter request patient to be discharged home with home health, daughter state patient has 24.7 care  at home, home health order placed              Anticipated d/c date is: TBD, need to wean oxygen, currently still on high flow                Consultants:   Psychiatry  Cardiology  Procedures:   None  Antimicrobials:    Cefepime 03/23/2020--->  Azithromycin 03/19/2020---> 03/23/2020  Rocephin 03/19/2020---> 03/23/2020      Objective: Vitals:   03/25/20 2300 03/26/20 0546 03/26/20 0826 03/26/20 1054  BP: (!) 113/56 (!) 152/86 (!) 150/84   Pulse: 65 77 71   Resp: 19 20 20    Temp: 97.6 F (36.4 C) 97.6 F (36.4 C) 97.8 F (36.6 C)   TempSrc: Axillary Oral Oral   SpO2:  93%  91%  Weight:  46.6 kg    Height:        Intake/Output Summary (Last 24 hours) at 03/26/2020 1242 Last data filed at 03/26/2020 0900 Gross per 24 hour  Intake 540 ml  Output --  Net 540 ml   Filed Weights   02/26/20 1100 03/26/20 0546  Weight: 48 kg 46.6 kg     Examination:  General exam: thin, frail, demented elderly, appear calm, NAD Respiratory system: diminished, no wheezing, no rales, no rhonchi. Respiratory effort normal. Cardiovascular system: S1 & S2 heard, RRR. Trace pedal/ankle pitting edema Gastrointestinal system: Abdomen is nondistended, soft and nontender.  Normal bowel sounds heard. Central nervous system: Alert , demented, only oriented to self ,does not talk much, moving all extremities. Extremities: generalized weakness,  Trace pedal/ankle pitting edema Skin: No rashes, lesions or ulcers Psychiatry: demented, currently calm and cooperative    Data Reviewed: I have personally reviewed following labs and imaging studies  CBC: Recent Labs  Lab 03/20/20 0154 03/21/20 0152 03/22/20 0312 03/23/20 0313 03/24/20 0022  WBC 6.7 8.0 6.0 6.6 5.9  NEUTROABS 6.1 7.4 5.3 5.8 5.3  HGB 10.4* 10.7* 10.3* 11.1* 10.1*  HCT 33.3* 34.5* 33.4* 36.5 33.5*  MCV 91.0 92.2 92.8 94.3 94.4  PLT 221 246 246 258 278    Basic Metabolic Panel: Recent Labs  Lab 03/20/20 0154 03/21/20 0152 03/22/20 0312 03/23/20 0313 03/24/20 0022 03/25/20 0226  NA 135 135 136 139 139 142  K 3.2* 3.4* 3.6 3.7 3.1* 3.7  CL 87* 86* 88* 84* 86* 89*  CO2 38* 38* 39* 43* 41* 41*  GLUCOSE 132* 109* 111* 104* 142* 109*  BUN 23 22 18 18 23  24*  CREATININE 0.95 0.83 0.90 0.96 1.06* 0.93  CALCIUM 8.8* 8.8* 8.7* 9.2 9.1 9.7  MG 1.9 2.0 1.9 1.9 1.9  --   PHOS 3.0 2.7 2.9 3.3 3.2  --     GFR: Estimated Creatinine Clearance: 36.1 mL/min (by C-G formula based on SCr of 0.93 mg/dL).  Liver Function Tests: Recent Labs  Lab 03/20/20 0154 03/21/20 0152 03/22/20 0312 03/23/20 0313 03/24/20 0022  AST 26 33 29 28 22   ALT 32 35 32 35 29  ALKPHOS 96 106 108 103 87  BILITOT 0.7 0.4 0.6 0.3 0.4  PROT 6.2* 6.3* 6.2* 6.7 5.9*  ALBUMIN 2.3* 2.4* 2.2* 2.3* 2.0*    CBG: Recent Labs  Lab 03/26/20 0735  GLUCAP 98     Recent Results (from the past 240  hour(s))  MRSA PCR Screening     Status: None   Collection Time: 03/20/20  6:49 PM   Specimen: Nasopharyngeal  Result Value Ref Range Status   MRSA  by PCR NEGATIVE NEGATIVE Final    Comment:        The GeneXpert MRSA Assay (FDA approved for NASAL specimens only), is one component of a comprehensive MRSA colonization surveillance program. It is not intended to diagnose MRSA infection nor to guide or monitor treatment for MRSA infections. Performed at [facility], [address], [place], [place] [address]          Radiology Studies: No results found.      Scheduled Meds: . (feeding supplement) PROSource Plus  30 mL Oral BID BM  . amLODipine  5 mg Oral Daily  . enoxaparin (LOVENOX) injection  30 mg Subcutaneous Q24H  . famotidine  20 mg Oral Daily  . furosemide  20 mg Oral Daily  . guaiFENesin  30 mL Oral BID  . ipratropium-albuterol  3 mL Nebulization TID  . metoprolol tartrate  100 mg Oral BID  . mometasone-formoterol  2 puff Inhalation BID  . multivitamin with minerals  1 tablet Oral Daily  . nicotine  21 mg Transdermal Daily  . polyethylene glycol  17 g Oral BID  . potassium chloride  20 mEq Oral BID  . QUEtiapine  25 mg Oral BID  . senna-docusate  1 tablet Oral BID   Continuous Infusions: . ceFEPime (MAXIPIME) IV 2 g (03/26/20 0923)     LOS: 29 days   Time spent: Greater than 50% of this time was spent in counseling, explanation of diagnosis, planning of further management, and coordination of care.   Voice Recognition Reubin Milan dictation system was used to create this note, attempts have been made to correct errors. Please contact the author with questions and/or clarifications.   Albertine Grates, MD PhD FACP [facility]  Available via Epic secure chat 7am-7pm for nonurgent issues Please page for urgent issues To page the attending provider between 7A-7P or the covering provider during after hours 7P-7A, please log into the web site  [email or web address] and access using universal [place] password for that web site. If you do not have the password, please call the hospital operator.    03/26/2020, 12:42 PM

## 2020-03-27 DIAGNOSIS — J9602 Acute respiratory failure with hypercapnia: Secondary | ICD-10-CM | POA: Diagnosis not present

## 2020-03-27 DIAGNOSIS — J9601 Acute respiratory failure with hypoxia: Secondary | ICD-10-CM | POA: Diagnosis not present

## 2020-03-27 LAB — MAGNESIUM: Magnesium: 1.9 mg/dL (ref 1.7–2.4)

## 2020-03-27 LAB — BASIC METABOLIC PANEL
Anion gap: 9 (ref 5–15)
BUN: 32 mg/dL — ABNORMAL HIGH (ref 8–23)
CO2: 38 mmol/L — ABNORMAL HIGH (ref 22–32)
Calcium: 9.4 mg/dL (ref 8.9–10.3)
Chloride: 92 mmol/L — ABNORMAL LOW (ref 98–111)
Creatinine, Ser: 0.87 mg/dL (ref 0.44–1.00)
GFR, Estimated: >60 mL/min (ref >60)
Glucose, Bld: 108 mg/dL — ABNORMAL HIGH (ref 70–99)
Potassium: 4.4 mmol/L (ref 3.5–5.1)
Sodium: 139 mmol/L (ref 135–145)

## 2020-03-27 LAB — PROCALCITONIN: Procalcitonin: 0.41 ng/mL

## 2020-03-27 LAB — D-DIMER, QUANTITATIVE: D-Dimer, Quant: 3.65 ug/mL-FEU — ABNORMAL HIGH (ref 0.00–0.50)

## 2020-03-27 LAB — C-REACTIVE PROTEIN: CRP: 10.1 mg/dL — ABNORMAL HIGH (ref <1.0)

## 2020-03-27 LAB — BRAIN NATRIURETIC PEPTIDE: B Natriuretic Peptide: 544.6 pg/mL — ABNORMAL HIGH (ref 0.0–100.0)

## 2020-03-27 MED ORDER — PREDNISONE 20 MG PO TABS
50.0000 mg | ORAL_TABLET | Freq: Every day | ORAL | Status: DC
  Administered 2020-03-28: 50 mg via ORAL
  Filled 2020-03-27: qty 2

## 2020-03-27 NOTE — Progress Notes (Signed)
PROGRESS NOTE    Patricia Terrell  DJM:[id number] DOB: [birth year] DOA: 02/24/2020 PCP: Pcp, No    Chief Complaint  Patient presents with  . Psychiatric Evaluation  . Altered Mental Status    Brief Narrative:  Patricia Terrell is an 80 y.o. female past medical history significant for but not limited to dementia, COPD/emphysema, hypertension as well as other comorbidities was admitted from 02/13/20-02/16/20 for COPD exacerbation and was found to be Covid positive at that time, she was treated with 3 days of remdesivir and discharged home, she came back to the hospital on 02/26/2020 with shortness of breath. This time she was diagnosed with worsening COVID-19 pneumonia along with acute on chronic diastolic CHF and admitted to the hospital. She developed worsening encephalopathy and became aggressive, psych was consulted and currently she has been IVC to be placed to inpatient psych facility, patient has improved , and cleared by psychiatry after psychiatry reeval on February 24 , continue to have safety sitter at bedside for frequent reorientation and fall prevention.  Of note patient has moved from [place] with her daughter 1 month ago, according to the daughter patient was having some memory issues for a while at [place] she is unaware of having any behavioral issues there.  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)   Dysphagia   Hypertrophic cardiomyopathy  (HCC)   Hypokalemia   CKD (chronic kidney disease), stage III (HCC)   Tobacco use disorder   Acute metabolic encephalopathy   Anemia of chronic disease   GERD (gastroesophageal reflux disease)   Bilateral lower extremity edema  Acute hypoxic respiratory failure due to COVID-19 pneumonitis and acute on chronic diastolic CHF exacerbation  She also  has chronic CO2 retention with advanced emphysema (CTA chest personally revealed) CTA chest, personally reviewed also showed large right lower lobe airspace opacity consistent with lobar pneumonia Urine strep pneumo antigen negative, urine Legionella antigen negative, MRSA screening negative,  initial procalcitonin less than 0.1,  sputum sample does not appear has been collected,  I did not hear any productive cough today during encounter, lung sounds diminished but no wheezing, no rales She was treated with Rocephin and Zithromax for 5 days from 2/25 to 3/1, then cefepime from 3/1, there is no fever, no leukocytosis, She also received iv lasix , now on oral lasix  she is still on HFNC at 5-6 liters, does not appear in respiratory distress, will continue cefepime and oral Lasix, crp /ddimerelevated  start short course of steroid for post covid inflammatory lung changes Wean oxygen as tolerated, keep O2 above 88%   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  Hypertrophic Cardiomyopathy Status post echo 02/26/2020 which showed severe concentric LVH with cavity obliteration of mid cavity and  apex with systole c/w HCOM (see report).  Cardiology has been consulted and has recommended low-dose beta-blocker and avoidance of overdiuresis.  Also, avoid Imdur or  Norvasc.  Lasix Deescalated to 20 mg daily.  Appears euvolemic   Lower extremity edema Doppler studies negative for DVT.  Likely due to underlying heart failure.  3/4 exam with trace ankle and pedal edema, no significant leg edema, elevate leg  Hypokalemia/hypomagnesemia Continue replace, keep K above 4, mag above 2  CKD 2/anemia of chronic disease Creatinine and hemoglobin close to baseline Monitor, renal dosing meds  Hypertension Currently on Norvasc, Lasix, Lopressor, monitor, adjust meds as needed  Tobacco use disorder She was started on NicoDerm 21mg  on February 2, plan for total of 6 weeks then transition to 14 mg for 2 weeks then transition to 7 mg for 2 weeks then stop   Acute metabolic encephalopathy with baseline dementia Patient initially has homicidal ideation was evaluated by psychiatry initial recommendation for inpatient Geri psych placement, patient has improved, psychiatry cleared patient on reeval  on February 24  Continue Seroquel , continue sitter for frequent reorientation and fall risk prevention  Nutritional Assessment: The patient's BMI is: Body mass index is 17.1 kg/m.February 26 Seen by dietician.  I agree with the assessment and plan as outlined below: Nutrition Status: Nutrition Problem: Severe Malnutrition Etiology: chronic illness (COPD/CHF) Signs/Symptoms: severe fat depletion,severe muscle depletion Interventions: Ensure Enlive (each supplement provides 350kcal and 20 grams of protein),MVI,Prostat  .   Unresulted Labs (From admission, onward)          Start     Ordered   03/27/20 0500  C-reactive protein  Daily,   R     Question:  Specimen collection method  Answer:  Lab=Lab collect   03/26/20 0921   03/27/20 0500  D-dimer, quantitative  Daily,   R      03/26/20 0921   03/27/20 0500  Brain natriuretic peptide  Daily,   R     Question:  Specimen collection method  Answer:  Lab=Lab collect   03/26/20 0921   03/27/20 0500  Basic metabolic panel   Daily,   R     Question:  Specimen collection method  Answer:  Lab=Lab collect   03/26/20 0921   03/27/20 0500  Procalcitonin  Daily,   R     Question:  Specimen collection method  Answer:  Lab=Lab collect   03/26/20 05/26/20   03/26/20 0924  Expectorated Sputum Assessment w Gram Stain, Rflx to Resp Cult  Once,   R        03/26/20 0923            DVT prophylaxis: Place TED hose Start: 03/12/20 0847 Place TED hose Start: 02/29/20 0918 enoxaparin (LOVENOX) injection 30 mg Start: 02/28/20 1000   Code Status:DNR Family Communication: Daughter updated over the phone on 3/4 Disposition:   Status is: Inpatient  Dispo: The patient is from: home              Anticipated d/c is to: Daughter request patient to be discharged home with home health, daughter state patient has 24.7 care at home, home health order placed              Anticipated d/c date is: TBD, need to wean oxygen, currently still on high flow                Consultants:   Psychiatry  Cardiology  Procedures:   None  Antimicrobials:  Cefepime 03/23/2020--->  Azithromycin 03/19/2020---> 03/23/2020  Rocephin 03/19/2020---> 03/23/2020      Objective: Vitals:   03/27/20 0618 03/27/20 0817 03/27/20 1230 03/27/20 1309  BP: (!) 150/71  117/66   Pulse: 68  (!) 55   Resp: 18  16   Temp: 97.6 F (36.4 C)  (!) 97.4 F (36.3 C)   TempSrc: Oral  Axillary   SpO2: 98% 98% 95% 95%  Weight:      Height:        Intake/Output Summary (Last 24 hours) at 03/27/2020 1936 Last data filed at 03/27/2020 1300 Gross per 24 hour  Intake 60 ml  Output -  Net 60 ml   Filed Weights   02/26/20 1100 03/26/20 0546  Weight: 48 kg 46.6 kg    Examination:  General exam: thin, frail, demented elderly, appear calm, NAD Respiratory system: diminished, no wheezing, no rales, no rhonchi. Respiratory effort normal. Cardiovascular system: S1 & S2 heard, RRR. Trace pedal/ankle pitting edema Gastrointestinal system: Abdomen is  nondistended, soft and nontender.  Normal bowel sounds heard. Central nervous system: Alert , demented, only oriented to self ,does not talk much, moving all extremities. Extremities: generalized weakness,  Trace pedal/ankle pitting edema Skin: No rashes, lesions or ulcers Psychiatry: demented, currently calm and cooperative    Data Reviewed: I have personally reviewed following labs and imaging studies  CBC: Recent Labs  Lab 03/21/20 0152 03/22/20 0312 03/23/20 0313 03/24/20 0022  WBC 8.0 6.0 6.6 5.9  NEUTROABS 7.4 5.3 5.8 5.3  HGB 10.7* 10.3* 11.1* 10.1*  HCT 34.5* 33.4* 36.5 33.5*  MCV 92.2 92.8 94.3 94.4  PLT 246 246 258 278    Basic Metabolic Panel: Recent Labs  Lab 03/21/20 0152 03/22/20 0312 03/23/20 0313 03/24/20 0022 03/25/20 0226 03/27/20 0210  NA 135 136 139 139 142 139  K 3.4* 3.6 3.7 3.1* 3.7 4.4  CL 86* 88* 84* 86* 89* 92*  CO2 38* 39* 43* 41* 41* 38*  GLUCOSE 109* 111* 104* 142* 109* 108*  BUN 22 18 18 23  24* 32*  CREATININE 0.83 0.90 0.96 1.06* 0.93 0.87  CALCIUM 8.8* 8.7* 9.2 9.1 9.7 9.4  MG 2.0 1.9 1.9 1.9  --  1.9  PHOS 2.7 2.9 3.3 3.2  --   --     GFR: Estimated Creatinine Clearance: 38.6 mL/min (by C-G formula based on SCr of 0.87 mg/dL).  Liver Function Tests: Recent Labs  Lab 03/21/20 0152 03/22/20 0312 03/23/20 0313 03/24/20 0022  AST 33 29 28 22   ALT 35 32 35 29  ALKPHOS 106 108 103 87  BILITOT 0.4 0.6 0.3 0.4  PROT 6.3* 6.2* 6.7 5.9*  ALBUMIN 2.4* 2.2* 2.3* 2.0*    CBG: Recent Labs  Lab 03/26/20 0735  GLUCAP 98     Recent Results (from the past 240 hour(s))  MRSA PCR Screening     Status: None   Collection Time: 03/20/20  6:49 PM   Specimen: Nasopharyngeal  Result Value Ref Range Status   MRSA by PCR NEGATIVE NEGATIVE Final    Comment:        The GeneXpert MRSA Assay (FDA approved for NASAL specimens only), is one component of a comprehensive MRSA colonization surveillance program. It is not intended to  diagnose MRSA infection nor to guide or monitor treatment for MRSA infections. Performed at [facility], [address], [place], [address] [place]          Radiology Studies: No results found.  Scheduled Meds: . (feeding supplement) PROSource Plus  30 mL Oral BID BM  . amLODipine  5 mg Oral Daily  . enoxaparin (LOVENOX) injection  30 mg Subcutaneous Q24H  . famotidine  20 mg Oral Daily  . furosemide  20 mg Oral Daily  . guaiFENesin  30 mL Oral BID  . ipratropium-albuterol  3 mL Nebulization TID  . metoprolol tartrate  100 mg Oral BID  . mometasone-formoterol  2 puff Inhalation BID  . multivitamin with minerals  1 tablet Oral Daily  . nicotine  21 mg Transdermal Daily  . potassium chloride  20 mEq Oral BID  . [START ON 03/28/2020] predniSONE  50 mg Oral Q breakfast  . QUEtiapine  25 mg Oral BID  . saccharomyces boulardii  250 mg Oral BID  . senna-docusate  1 tablet Oral QHS   Continuous Infusions: . ceFEPime (MAXIPIME) IV 2 g (03/27/20 1010)     LOS: 30 days   Time spent: 25mins Greater than 50% of this time was spent in counseling, explanation of diagnosis, planning of further management, and coordination of care.   Voice Recognition Reubin Milan/Dragon dictation system was used to create this note, attempts have been made to correct errors. Please contact the author with questions and/or clarifications.   Albertine GratesFang Xu, MD PhD FACP [facility]  Available via Epic secure chat 7am-7pm for nonurgent issues Please page for urgent issues To page the attending provider between 7A-7P or the covering provider during after hours 7P-7A, please log into the web site [email or web address] and access using universal [place] password for that web site. If you do not have the password, please call the hospital operator.    03/27/2020, 7:36 PM

## 2020-03-27 NOTE — Progress Notes (Signed)
Patient refused Bipap tonught. Placed pt on and only wore for less than 

## 2020-03-28 DIAGNOSIS — J9602 Acute respiratory failure with hypercapnia: Secondary | ICD-10-CM | POA: Diagnosis not present

## 2020-03-28 DIAGNOSIS — J9601 Acute respiratory failure with hypoxia: Secondary | ICD-10-CM | POA: Diagnosis not present

## 2020-03-28 LAB — BASIC METABOLIC PANEL
Anion gap: 8 (ref 5–15)
BUN: 23 mg/dL (ref 8–23)
CO2: 42 mmol/L — ABNORMAL HIGH (ref 22–32)
Calcium: 9.1 mg/dL (ref 8.9–10.3)
Chloride: 90 mmol/L — ABNORMAL LOW (ref 98–111)
Creatinine, Ser: 0.83 mg/dL (ref 0.44–1.00)
GFR, Estimated: >60 mL/min (ref >60)
Glucose, Bld: 96 mg/dL (ref 70–99)
Potassium: 3.5 mmol/L (ref 3.5–5.1)
Sodium: 140 mmol/L (ref 135–145)

## 2020-03-28 LAB — D-DIMER, QUANTITATIVE: D-Dimer, Quant: 3.24 ug/mL-FEU — ABNORMAL HIGH (ref 0.00–0.50)

## 2020-03-28 LAB — BRAIN NATRIURETIC PEPTIDE: B Natriuretic Peptide: 237.6 pg/mL — ABNORMAL HIGH (ref 0.0–100.0)

## 2020-03-28 LAB — C-REACTIVE PROTEIN: CRP: 5.4 mg/dL — ABNORMAL HIGH (ref <1.0)

## 2020-03-28 LAB — PROCALCITONIN: Procalcitonin: 0.28 ng/mL

## 2020-03-28 MED ORDER — POTASSIUM CHLORIDE CRYS ER 20 MEQ PO TBCR
40.0000 meq | EXTENDED_RELEASE_TABLET | Freq: Once | ORAL | Status: AC
  Administered 2020-03-28: 40 meq via ORAL
  Filled 2020-03-28: qty 2

## 2020-03-28 MED ORDER — PREDNISONE 20 MG PO TABS
40.0000 mg | ORAL_TABLET | Freq: Every day | ORAL | Status: AC
  Administered 2020-03-29: 40 mg via ORAL
  Filled 2020-03-28: qty 2

## 2020-03-28 MED ORDER — PREDNISONE 20 MG PO TABS: 40.0000 mg | ORAL_TABLET | Freq: Every day | ORAL | Status: DC

## 2020-03-28 MED ORDER — PREDNISONE 10 MG PO TABS
10.0000 mg | ORAL_TABLET | Freq: Every day | ORAL | Status: AC
  Administered 2020-04-01: 10 mg via ORAL
  Filled 2020-03-28: qty 1

## 2020-03-28 MED ORDER — PREDNISONE 20 MG PO TABS
30.0000 mg | ORAL_TABLET | Freq: Every day | ORAL | Status: AC
  Administered 2020-03-30: 30 mg via ORAL
  Filled 2020-03-28 (×2): qty 1

## 2020-03-28 MED ORDER — PREDNISONE 20 MG PO TABS
20.0000 mg | ORAL_TABLET | Freq: Every day | ORAL | Status: AC
  Administered 2020-03-31: 20 mg via ORAL
  Filled 2020-03-28: qty 1

## 2020-03-28 NOTE — Progress Notes (Signed)
PROGRESS NOTE    Patricia Terrell  MHD:[id number] DOB: 12/01/1940 DOA: 02/24/2020 PCP: Pcp, No    Chief Complaint  Patient presents with  . Psychiatric Evaluation  . Altered Mental Status    Brief Narrative:  Patricia Terrell is an 80 y.o. female past medical history significant for but not limited to dementia, COPD/emphysema, hypertension as well as other comorbidities was admitted from 02/13/20-02/16/20 for COPD exacerbation and was found to be Covid positive at that time, she was treated with 3 days of remdesivir and discharged home, she came back to the hospital on 02/26/2020 with shortness of breath. This time she was diagnosed with worsening COVID-19 pneumonia along with acute on chronic diastolic CHF and admitted to the hospital. She developed worsening encephalopathy and became aggressive, psych was consulted and currently she has been IVC to be placed to inpatient psych facility, patient has improved , and cleared by psychiatry after psychiatry reeval on February 24 , continue to have safety sitter at bedside for frequent reorientation and fall prevention.  Of note patient has moved from [place] with her daughter 1 month ago, according to the daughter patient was having some memory issues for a while at [place] she is unaware of having any behavioral issues there.  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)    Dysphagia   Hypertrophic cardiomyopathy (HCC)   Hypokalemia   CKD (chronic kidney disease), stage III (HCC)   Tobacco use disorder   Acute metabolic encephalopathy   Anemia of chronic disease   GERD (gastroesophageal reflux disease)   Bilateral lower extremity edema  Acute hypoxic respiratory failure due to COVID-19 pneumonitis , acute on chronic diastolic CHF exacerbation and RLL pneumonia -She also  has chronic CO2 retention with advanced emphysema (CTA chest personally revealed) -CTA chest, personally reviewed also showed large right lower lobe airspace opacity consistent with lobar pneumonia --Urine strep pneumo antigen negative, urine Legionella antigen negative, MRSA screening negative,  initial procalcitonin less than 0.1,  -sputum sample does not appear has been collected,  -she does has intermittent congested cough, lung sounds diminished but no wheezing, no rales -She was treated with Rocephin and Zithromax for 5 days from 2/25 to 3/1, then cefepime from 3/1, there is no fever, no leukocytosis, plan to finish total of 7 days of cefepime -She also received iv lasix , now on oral lasix  -she is still on HFNC , does not appear in respiratory distress, will continue cefepime and oral Lasix, -crp /ddimerelevated  start short course of steroid for post covid inflammatory lung changes -Wean oxygen as tolerated, keep O2 above 88% -chest PT as tolerated -can discharge home once wean off HFNC and change to regular o2 supplement   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  Hypertrophic Cardiomyopathy Status post echo 02/26/2020 which showed severe concentric LVH with cavity obliteration of mid  cavity and apex with systole c/w HCOM (see report).  Cardiology has been consulted and has recommended low-dose beta-blocker and avoidance of overdiuresis.  Also, avoid Imdur or Norvasc.  Lasix Deescalated to 20 mg daily.  Appears euvolemic   Lower extremity edema Doppler studies negative for DVT.  Likely due to underlying heart failure.  3/4 exam with trace ankle and pedal edema, no significant leg edema, elevate leg  Hypokalemia/hypomagnesemia Continue replace, keep K above 4, mag above 2  CKD 2/anemia of chronic disease Creatinine and hemoglobin close to baseline Monitor, renal dosing meds  Hypertension Currently on Norvasc, Lasix, Lopressor, monitor, adjust meds as needed  Tobacco use disorder She was started on NicoDerm 21mg  on February 2, plan for total of 6 weeks then transition to 14 mg for 2 weeks then transition to 7 mg for 2 weeks then stop   Acute metabolic encephalopathy with baseline dementia Patient initially has homicidal ideation was evaluated by psychiatry initial recommendation for inpatient Geri psych placement, patient has improved, psychiatry cleared patient on reeval  on February 24  Continue Seroquel , continue sitter for frequent reorientation and fall risk prevention  Nutritional Assessment: The patient's BMI is: Body mass index is 17.1 kg/m.February 26 Seen by dietician.  I agree with the assessment and plan as outlined below: Nutrition Status: Nutrition Problem: Severe Malnutrition Etiology: chronic illness (COPD/CHF) Signs/Symptoms: severe fat depletion,severe muscle depletion Interventions: Ensure Enlive (each supplement provides 350kcal and 20 grams of protein),MVI,Prostat  .   Unresulted Labs (From admission, onward)          Start     Ordered   03/27/20 0500  C-reactive protein  Daily,   R     Question:  Specimen collection method  Answer:  Lab=Lab collect   03/26/20 0921   03/27/20 0500  D-dimer, quantitative  Daily,   R      03/26/20 0921    03/27/20 0500  Brain natriuretic peptide  Daily,   R     Question:  Specimen collection method  Answer:  Lab=Lab collect   03/26/20 0921   03/27/20 0500  Basic metabolic panel  Daily,   R     Question:  Specimen collection method  Answer:  Lab=Lab collect   03/26/20 0921   03/26/20 0924  Expectorated Sputum Assessment w Gram Stain, Rflx to Resp Cult  Once,   R        03/26/20 0923            DVT prophylaxis: Place TED hose Start: 03/12/20 0847 Place TED hose Start: 02/29/20 0918 enoxaparin (LOVENOX) injection 30 mg Start: 02/28/20 1000   Code Status:DNR Family Communication: Daughter updated over the phone on 3/4 Disposition:   Status is: Inpatient  Dispo: The patient is from: home              Anticipated d/c is to: Daughter request patient to be discharged home with home health, daughter states patient has 24/7 care at home, home health order placed              Anticipated d/c date is: TBD, need to wean oxygen, currently still on high flow                Consultants:   Psychiatry  Cardiology  Procedures:   None  Antimicrobials:  Cefepime 03/23/2020--->  Azithromycin 03/19/2020---> 03/23/2020  Rocephin 03/19/2020---> 03/23/2020      Objective: Vitals:   03/28/20 0600 03/28/20 0801 03/28/20 1201 03/28/20 1222  BP: (!) 162/81  (!) 142/82   Pulse: 64 94 62   Resp: 20 20    Temp: 97.7 F (36.5 C)  97.9 F (36.6 C)   TempSrc: Axillary     SpO2: 96% 91% 100% 90%  Weight:      Height:        Intake/Output Summary (Last 24 hours) at 03/28/2020 1724 Last data filed at 03/27/2020 1900 Gross per 24 hour  Intake 240 ml  Output --  Net 240 ml   Filed Weights   02/26/20 1100 03/26/20 0546  Weight: 48 kg 46.6 kg    Examination:  General exam: thin, frail, demented elderly, appear calm, NAD Respiratory system: diminished, no wheezing, no rales, no rhonchi. Respiratory effort normal. Cardiovascular system: S1 & S2 heard, RRR. Trace pedal/ankle pitting  edema Gastrointestinal system: Abdomen is nondistended, soft and nontender.  Normal bowel sounds heard. Central nervous system: Alert , demented, only oriented to self ,does not talk much, moving all extremities. Extremities: generalized weakness,  Trace pedal/ankle pitting edema Skin: No rashes, lesions or ulcers Psychiatry: demented, currently calm and cooperative    Data Reviewed: I have personally reviewed following labs and imaging studies  CBC: Recent Labs  Lab 03/22/20 0312 03/23/20 0313 03/24/20 0022  WBC 6.0 6.6 5.9  NEUTROABS 5.3 5.8 5.3  HGB 10.3* 11.1* 10.1*  HCT 33.4* 36.5 33.5*  MCV 92.8 94.3 94.4  PLT 246 258 278    Basic Metabolic Panel: Recent Labs  Lab 03/22/20 0312 03/23/20 0313 03/24/20 0022 03/25/20 0226 03/27/20 0210 03/28/20 0056  NA 136 139 139 142 139 140  K 3.6 3.7 3.1* 3.7 4.4 3.5  CL 88* 84* 86* 89* 92* 90*  CO2 39* 43* 41* 41* 38* 42*  GLUCOSE 111* 104* 142* 109* 108* 96  BUN 18 18 23  24* 32* 23  CREATININE 0.90 0.96 1.06* 0.93 0.87 0.83  CALCIUM 8.7* 9.2 9.1 9.7 9.4 9.1  MG 1.9 1.9 1.9  --  1.9  --   PHOS 2.9 3.3 3.2  --   --   --     GFR: Estimated Creatinine Clearance: 40.4 mL/min (by C-G formula based on SCr of 0.83 mg/dL).  Liver Function Tests: Recent Labs  Lab 03/22/20 0312 03/23/20 0313 03/24/20 0022  AST 29 28 22   ALT 32 35 29  ALKPHOS 108 103 87  BILITOT 0.6 0.3 0.4  PROT 6.2* 6.7 5.9*  ALBUMIN 2.2* 2.3* 2.0*    CBG: Recent Labs  Lab 03/26/20 0735  GLUCAP 98     Recent Results (from the past 240 hour(s))  MRSA PCR Screening     Status: None   Collection Time: 03/20/20  6:49 PM   Specimen: Nasopharyngeal  Result Value Ref Range Status   MRSA by PCR NEGATIVE NEGATIVE Final    Comment:        The GeneXpert MRSA Assay (FDA approved for NASAL specimens only), is one component of a comprehensive MRSA colonization surveillance program. It is not intended to diagnose MRSA infection nor to guide  or monitor treatment for MRSA infections. Performed at [facility], [address] [place], [place] [address]          Radiology Studies: No results found.      Scheduled Meds: . (feeding supplement) PROSource Plus  30 mL  Oral BID BM  . amLODipine  5 mg Oral Daily  . enoxaparin (LOVENOX) injection  30 mg Subcutaneous Q24H  . famotidine  20 mg Oral Daily  . furosemide  20 mg Oral Daily  . guaiFENesin  30 mL Oral BID  . ipratropium-albuterol  3 mL Nebulization TID  . metoprolol tartrate  100 mg Oral BID  . mometasone-formoterol  2 puff Inhalation BID  . multivitamin with minerals  1 tablet Oral Daily  . nicotine  21 mg Transdermal Daily  . potassium chloride  20 mEq Oral BID  . potassium chloride  40 mEq Oral Once  . [START ON 04/01/2020] predniSONE  10 mg Oral Q breakfast  . [START ON 03/31/2020] predniSONE  20 mg Oral Q breakfast  . [START ON 03/30/2020] predniSONE  30 mg Oral Q breakfast  . [START ON 03/29/2020] predniSONE  40 mg Oral Q breakfast  . QUEtiapine  25 mg Oral BID  . saccharomyces boulardii  250 mg Oral BID  . senna-docusate  1 tablet Oral QHS   Continuous Infusions: . ceFEPime (MAXIPIME) IV 2 g (03/28/20 1004)     LOS: 31 days   Time spent: Greater than 50% of this time was spent in counseling, explanation of diagnosis, planning of further management, and coordination of care.   Voice Recognition [employer]ubin [place] dictation system was used to create this note, attempts have been made to correct errors. Please contact the author with questions and/or clarifications.   Albertine Grates, MD PhD FACP [facility]  Available via Epic secure chat 7am-7pm for nonurgent issues Please page for urgent issues To page the attending provider between 7A-7P or the covering provider during after hours 7P-7A, please log into the web site [email or web address] and access using universal [place] password for that web site. If you do not have the password, please call the  hospital operator.    03/28/2020, 5:24 PM

## 2020-03-28 NOTE — Progress Notes (Signed)
Patient refused bipap tonight

## 2020-03-28 NOTE — TOC Progression Note (Signed)
Transition of Care [facility]) - Progression Note    Patient Details  Name: Patricia Terrell MRN: [id number] Date of Birth: 1940/08/23  Transition of Care [facility]) CM/SW Contact  Lawerance Sabal, RN Phone Number: 03/28/2020, 9:03 AM  Clinical Narrative:   Per psychiatry notes patient cleared on 2/24.   Spoke to patient's daughter over the phone. She has made a follow up for PCP at [facility] April 8th, added to AVS.  She confirms her request to have patient DC to home with her, demographics in chart verified. She would like HH services, we discussed ratings, agreed to [facility], referral placed. She confirms that patient has portable oxygen concentrator and tanks at home through [employer].  CM will need to notify [employer] of new DME order and date of DC so they can switch out POC for standard concentrator after patient gets home. Daughter states family will provide transportation home when needed. She was reminded her to bring oxygen for transport.     Expected Discharge Plan: Home w Home Health Services Barriers to Discharge: Continued Medical Work up  Expected Discharge Plan and Services Expected Discharge Plan: Home w Home Health Services In-house Referral: Clinical Social Work Discharge Planning Services: CM Consult Post Acute Care Choice: Home Health,Durable Medical Equipment Living arrangements for the past 2 months: Apartment                 DME Arranged: Oxygen DME Agency: [employer]       HH Arranged: RN,PT,OT,Nurse's Aide HH Agency: [facility] Date [facility] Agency Contacted: 03/28/20 Time HH Agency Contacted: [phone] Representative spoke with at [facility] Agency: Kandee Keen   Social Determinants of Health (SDOH) Interventions    Readmission Risk Interventions No flowsheet data found.

## 2020-03-29 DIAGNOSIS — J9602 Acute respiratory failure with hypercapnia: Secondary | ICD-10-CM

## 2020-03-29 DIAGNOSIS — J9601 Acute respiratory failure with hypoxia: Secondary | ICD-10-CM | POA: Diagnosis not present

## 2020-03-29 LAB — BASIC METABOLIC PANEL
Anion gap: 9 (ref 5–15)
BUN: 32 mg/dL — ABNORMAL HIGH (ref 8–23)
CO2: 34 mmol/L — ABNORMAL HIGH (ref 22–32)
Calcium: 9.2 mg/dL (ref 8.9–10.3)
Chloride: 99 mmol/L (ref 98–111)
Creatinine, Ser: 0.94 mg/dL (ref 0.44–1.00)
GFR, Estimated: >60 mL/min (ref >60)
Glucose, Bld: 97 mg/dL (ref 70–99)
Potassium: 5 mmol/L (ref 3.5–5.1)
Sodium: 142 mmol/L (ref 135–145)

## 2020-03-29 LAB — BRAIN NATRIURETIC PEPTIDE: B Natriuretic Peptide: 549.8 pg/mL — ABNORMAL HIGH (ref 0.0–100.0)

## 2020-03-29 LAB — POTASSIUM: Potassium: 4.3 mmol/L (ref 3.5–5.1)

## 2020-03-29 LAB — D-DIMER, QUANTITATIVE: D-Dimer, Quant: 2.18 ug/mL-FEU — ABNORMAL HIGH (ref 0.00–0.50)

## 2020-03-29 LAB — C-REACTIVE PROTEIN: CRP: 2.6 mg/dL — ABNORMAL HIGH (ref <1.0)

## 2020-03-29 NOTE — Progress Notes (Signed)
PROGRESS NOTE    Patricia Terrell  JOI:[id number] DOB: 06-13-1940 DOA: 02/24/2020 PCP: Pcp, No    Brief Narrative: This 80 y.o.femalewith PMH significant for  dementia, COPD/emphysema, hypertension as well as other comorbidities was admitted from 02/13/20-02/16/20 for COPD exacerbation and was found to be Covid positive at that time, she was treated with 3 days of remdesivir and discharged home, she came back to the hospital on 02/26/2020 with shortness of breath. This time she was diagnosed with worsening COVID-19 pneumonia along with acute on chronic diastolic CHF and admitted to the hospital. She developed worsening encephalopathy and became aggressive, psych was consulted and currently she has been IVC to be placed to inpatient psych facility, patient has improved , and cleared by psychiatry after psychiatry reeval on February 24 , continued to have safety sitter at bedside for frequent reorientation and fall prevention. Of note patient has moved from [place] with her daughter 1 month ago, according to the daughter patient was having some memory issues for a while at [place]. she is unaware of having any behavioral issues there.   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)   Dysphagia   Hypertrophic cardiomyopathy (HCC)   Hypokalemia   CKD (chronic kidney disease), stage III (HCC)   Tobacco use disorder   Acute metabolic encephalopathy   Anemia of chronic disease   GERD (gastroesophageal reflux disease)   Bilateral lower extremity edema   Acute hypoxic respiratory failure due to COVID-19 pneumonitis , acute on chronic diastolic CHF exacerbation and RLL pneumonia - Patient has chronic  CO2 retention with advanced emphysema (CTA chest  emphysemia) - CTA chest, personally reviewed also showed large right lower lobe airspace opacity consistent with lobar pneumonia. --Urine strep pneumo antigen negative, urine Legionella antigen negative, MRSA screening negative,  initial procalcitonin less than 0.1,  -sputum sample does not appear has been collected, -She does has intermittent cough with congestion, lung sounds diminished but no wheezing, no rales -She was treated with Rocephin and Zithromax for 5 days from 2/25 to 3/1, then cefepime from 3/1, there is no fever, no leukocytosis,  - plan to finish total of 7 days of cefepime. -She also received iv lasix ,transitioned on oral lasix -she is still on HFNC , does not appear in respiratory distress, will continue cefepime and oral Lasix, -crp /ddimer elevated  start short course of steroid for post covid inflammatory lung changes. -Wean oxygen as tolerated, keep O2 above 88%. -Chest PT as tolerated. -Can discharge home once wean off HFNC and change to regular O2 supplement. 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  Hypertrophic Cardiomyopathy: Status post echo 02/26/2020 which showed severe concentric LVH with cavity obliteration of mid cavity and apex with systole c/w HCOM (see report).  Cardiology has been consulted and has recommended low-dose beta-blocker and avoidance of overdiuresis.  Also, avoid Imdur or Norvasc. Lasix Deescalated to 20 mg daily. Appears euvolemic   Lower extremity edema Doppler studies  negative for DVT. Likely due to underlying heart failure.  3/4 exam with trace  ankle and pedal edema, no significant leg edema, advised to elevate leg  Hypokalemia/hypomagnesemia Continue replace, keep K above 4, mag above 2  CKD 2/anemia of chronic disease Creatinine and hemoglobin close to baseline. Monitor, renal dosing meds  Hypertension Currently on Norvasc, Lasix, Lopressor, monitor, adjust meds as needed.  Tobacco use disorder She was started on NicoDerm 21mg  on February 2, plan for total of 6 weeks then transition to 14 mg for 2 weeks then transition to 7 mg for 2 weeks then stop   Acute metabolic encephalopathy with baseline dementia Patient initially has homicidal ideations was evaluated by psychiatry,  initial recommendation for inpatient Geri psych placement, patient has improved, psychiatry cleared patient on reeval  on February 24  Continue Seroquel , continue sitter for frequent reorientation and fall risk prevention  Nutritional Assessment: The patient's BMI is: Body mass index is 17.1 kg/m.February 26 Seen by dietician.  I agree with the assessment and plan as outlined below: Nutrition Status: Nutrition Problem: Severe Malnutrition Etiology: chronic illness (COPD/CHF) Signs/Symptoms: severe fat depletion,severe muscle depletion Interventions: Ensure Enlive (each supplement provides 350kcal and 20 grams of protein),MVI,Prostat   DVT prophylaxis: Lovenox Code Status:  DNR Family Communication:  No family at bed side. Disposition Plan:  Status is: Inpatient  Remains inpatient appropriate because:Inpatient level of care appropriate due to severity of illness   Dispo:  Patient From: Home  Planned Disposition: Home with Home servics  Medically stable for discharge: No     Consultants:   None  Procedures:  Antimicrobials:   Anti-infectives (From admission, onward)   Start     Dose/Rate Route Frequency Ordered Stop   03/23/20 1230  ceFEPIme (MAXIPIME) 2 g in sodium chloride 0.9 % 100 mL IVPB        2 g 200 mL/hr over 30 Minutes  Intravenous Every 12 hours 03/23/20 1132 03/30/20 0959   03/21/20 2200  azithromycin (ZITHROMAX) tablet 500 mg        500 mg Oral Daily at bedtime 03/21/20 1333 03/23/20 2043   03/19/20 2100  azithromycin (ZITHROMAX) 500 mg in sodium chloride 0.9 % 250 mL IVPB  Status:  Discontinued        500 mg 250 mL/hr over 60 Minutes Intravenous Every 24 hours 03/19/20 1932 03/21/20 1333   03/19/20 2030  cefTRIAXone (ROCEPHIN) 2 g in sodium chloride 0.9 % 100 mL IVPB  Status:  Discontinued        2 g 200 mL/hr over 30 Minutes Intravenous Every 24 hours 03/19/20 1932 03/23/20 1114     Subjective: Patient was seen and examined at bedside.  Overnight events noted.  Patient is not in respiratory distress.   She is sitting comfortably but appears confused.  she is on 7 liters of oxygen, sats 92%.  Objective: Vitals:   03/29/20 0531 03/29/20 0535 03/29/20 0802 03/29/20 0808  BP: (!) 162/76  (!) 155/110   Pulse: (!) 53 62 69   Resp: (!) 22  19   Temp: (!) 97.5 F (36.4 C)     TempSrc: Oral     SpO2: 96%  95% 92%  Weight:      Height:       No intake or output data in the 24 hours ending 03/29/20 1434 Filed Weights   02/26/20 1100 03/26/20 0546  Weight: 48 kg 46.6 kg    Examination:  General exam: Appears calm and comfortable, not in  any acute distress. Respiratory system: Clear to auscultation. Respiratory effort normal. Cardiovascular system: S1 & S2 heard, RRR. No JVD, murmurs, rubs, gallops or clicks. No pedal edema. Gastrointestinal system: Abdomen is nondistended, soft and nontender. No organomegaly or masses felt. Normal bowel sounds heard. Central nervous system: Alert and oriented. No focal neurological deficits. Extremities: Symmetric 5 x 5 power. Skin: No rashes, lesions or ulcers Psychiatry: appears confused, no SI/HI    Data Reviewed: I have personally reviewed following labs and imaging studies  CBC: Recent Labs  Lab 03/23/20 0313 03/24/20 0022  WBC 6.6 5.9  NEUTROABS  5.8 5.3  HGB 11.1* 10.1*  HCT 36.5 33.5*  MCV 94.3 94.4  PLT 258 278   Basic Metabolic Panel: Recent Labs  Lab 03/23/20 0313 03/24/20 0022 03/25/20 0226 03/27/20 0210 03/28/20 0056 03/29/20 0247 03/29/20 1133  NA 139 139 142 139 140 142  --   K 3.7 3.1* 3.7 4.4 3.5 5.0 4.3  CL 84* 86* 89* 92* 90* 99  --   CO2 43* 41* 41* 38* 42* 34*  --   GLUCOSE 104* 142* 109* 108* 96 97  --   BUN 18 23 24* 32* 23 32*  --   CREATININE 0.96 1.06* 0.93 0.87 0.83 0.94  --   CALCIUM 9.2 9.1 9.7 9.4 9.1 9.2  --   MG 1.9 1.9  --  1.9  --   --   --   PHOS 3.3 3.2  --   --   --   --   --    GFR: Estimated Creatinine Clearance: 35.7 mL/min (by C-G formula based on SCr of 0.94 mg/dL). Liver Function Tests: Recent Labs  Lab 03/23/20 0313 03/24/20 0022  AST 28 22  ALT 35 29  ALKPHOS 103 87  BILITOT 0.3 0.4  PROT 6.7 5.9*  ALBUMIN 2.3* 2.0*   No results for input(s): LIPASE, AMYLASE in the last 168 hours. No results for input(s): AMMONIA in the last 168 hours. Coagulation Profile: No results for input(s): INR, PROTIME in the last 168 hours. Cardiac Enzymes: No results for input(s): CKTOTAL, CKMB, CKMBINDEX, TROPONINI in the last 168 hours. BNP (last 3 results) No results for input(s): PROBNP in the last 8760 hours. HbA1C: No results for input(s): HGBA1C in the last 72 hours. CBG: Recent Labs  Lab 03/26/20 0735  GLUCAP 98   Lipid Profile: No results for input(s): CHOL, HDL, LDLCALC, TRIG, CHOLHDL, LDLDIRECT in the last 72 hours. Thyroid Function Tests: No results for input(s): TSH, T4TOTAL, FREET4, T3FREE, THYROIDAB in the last 72 hours. Anemia Panel: No results for input(s): VITAMINB12, FOLATE, FERRITIN, TIBC, IRON, RETICCTPCT in the last 72 hours. Sepsis Labs: Recent Labs  Lab 03/27/20 0210 03/28/20 0056  PROCALCITON 0.41 0.28    Recent Results (from the past 240 hour(s))  MRSA PCR Screening     Status: None   Collection Time: 03/20/20  6:49 PM   Specimen: Nasopharyngeal   Result Value Ref Range Status   MRSA by PCR NEGATIVE NEGATIVE Final    Comment:        The GeneXpert MRSA Assay (FDA approved for NASAL specimens only), is one component of a comprehensive MRSA colonization surveillance program. It is not intended to diagnose MRSA infection nor to guide or monitor treatment for MRSA infections. Performed at [facility], [address] [place], [place] [address]      Radiology Studies: No results found.  Scheduled Meds: . (feeding supplement) PROSource Plus  30 mL  Oral BID BM  . amLODipine  5 mg Oral Daily  . enoxaparin (LOVENOX) injection  30 mg Subcutaneous Q24H  . famotidine  20 mg Oral Daily  . furosemide  20 mg Oral Daily  . guaiFENesin  30 mL Oral BID  . ipratropium-albuterol  3 mL Nebulization TID  . metoprolol tartrate  100 mg Oral BID  . mometasone-formoterol  2 puff Inhalation BID  . multivitamin with minerals  1 tablet Oral Daily  . nicotine  21 mg Transdermal Daily  . potassium chloride  20 mEq Oral BID  . [START ON 04/01/2020] predniSONE  10 mg Oral Q breakfast  . [START ON 03/31/2020] predniSONE  20 mg Oral Q breakfast  . [START ON 03/30/2020] predniSONE  30 mg Oral Q breakfast  . QUEtiapine  25 mg Oral BID  . saccharomyces boulardii  250 mg Oral BID  . senna-docusate  1 tablet Oral QHS   Continuous Infusions: . ceFEPime (MAXIPIME) IV 2 g (03/29/20 1315)     LOS: 32 days    Time spent: 35 mins    Wyatte Dames, MD [facility]   If 7PM-7AM, please contact night-coverage

## 2020-03-30 LAB — CBC
HCT: 32.8 % — ABNORMAL LOW (ref 36.0–46.0)
Hemoglobin: 10.2 g/dL — ABNORMAL LOW (ref 12.0–15.0)
MCH: 28.7 pg (ref 26.0–34.0)
MCHC: 31.1 g/dL (ref 30.0–36.0)
MCV: 92.4 fL (ref 80.0–100.0)
Platelets: 339 10*3/uL (ref 150–400)
RBC: 3.55 MIL/uL — ABNORMAL LOW (ref 3.87–5.11)
RDW: 16.8 % — ABNORMAL HIGH (ref 11.5–15.5)
WBC: 7.4 10*3/uL (ref 4.0–10.5)
nRBC: 0 % (ref 0.0–0.2)

## 2020-03-30 LAB — COMPREHENSIVE METABOLIC PANEL
ALT: 20 U/L (ref 0–44)
AST: 25 U/L (ref 15–41)
Albumin: 2.2 g/dL — ABNORMAL LOW (ref 3.5–5.0)
Alkaline Phosphatase: 53 U/L (ref 38–126)
Anion gap: 5 (ref 5–15)
BUN: 44 mg/dL — ABNORMAL HIGH (ref 8–23)
CO2: 37 mmol/L — ABNORMAL HIGH (ref 22–32)
Calcium: 9.4 mg/dL (ref 8.9–10.3)
Chloride: 98 mmol/L (ref 98–111)
Creatinine, Ser: 0.96 mg/dL (ref 0.44–1.00)
GFR, Estimated: >60 mL/min (ref >60)
Glucose, Bld: 96 mg/dL (ref 70–99)
Potassium: 5.3 mmol/L — ABNORMAL HIGH (ref 3.5–5.1)
Sodium: 140 mmol/L (ref 135–145)
Total Bilirubin: 0.7 mg/dL (ref 0.3–1.2)
Total Protein: 6.1 g/dL — ABNORMAL LOW (ref 6.5–8.1)

## 2020-03-30 LAB — MAGNESIUM: Magnesium: 2.3 mg/dL (ref 1.7–2.4)

## 2020-03-30 LAB — POTASSIUM: Potassium: 4.6 mmol/L (ref 3.5–5.1)

## 2020-03-30 LAB — PHOSPHORUS: Phosphorus: 2.8 mg/dL (ref 2.5–4.6)

## 2020-03-30 NOTE — TOC Progression Note (Signed)
Transition of Care [facility]) - Progression Note    Patient Details  Name: Athleen Feltner MRN: [id number] Date of Birth: 03-Nov-1940  Transition of Care [facility]) CM/SW Contact  Beckie Busing, RN Phone Number: [phone]  03/30/2020, 1:14 PM  Clinical Narrative:    Daughter at bedside requesting to speak with CM. Per daughter she does not want her mother to go to SNF for rehab. She definitely wants to take her home where she states she can provide care and has support to assist with the care of her mom. Daughter requesting HH PT/OT, HH aide,hospital bed BSC & O2. Message has been sent to MD.    Expected Discharge Plan: Home w Home Health Services Barriers to Discharge: Continued Medical Work up  Expected Discharge Plan and Services Expected Discharge Plan: Home w Home Health Services In-house Referral: Clinical Social Work Discharge Planning Services: CM Consult Post Acute Care Choice: Home Health,Durable Medical Equipment Living arrangements for the past 2 months: Apartment                 DME Arranged: Oxygen DME Agency: [employer]       HH Arranged: RN,PT,OT,Nurse's Aide HH Agency: [facility] Date [facility] Agency Contacted: 03/28/20 Time HH Agency Contacted: [phone] Representative spoke with at [facility] Agency: Kandee Keen   Social Determinants of Health (SDOH) Interventions    Readmission Risk Interventions No flowsheet data found.

## 2020-03-30 NOTE — Progress Notes (Signed)
PROGRESS NOTE    Patricia Terrell  ZOX:[id number] Cisco:[id number] DOB: 08/06/1940 DOA: 02/24/2020 PCP: Pcp, No    Brief Narrative: This 80 y.o.femalewith PMH significant for  dementia, COPD/emphysema, hypertension as well as other comorbidities was admitted from 02/13/20-02/16/20 for COPD exacerbation and was found to be Covid positive at that time, she was treated with 3 days of remdesivir and discharged home, she came back to the hospital on 02/26/2020 with shortness of breath. This time she was diagnosed with worsening COVID-19 pneumonia along with acute on chronic diastolic CHF and admitted to the hospital. She developed worsening encephalopathy and became aggressive, psych was consulted and currently she has been IVC to be placed to inpatient psych facility, patient has improved , and cleared by psychiatry after psychiatry reeval on February 24 , continued to have safety sitter at bedside for frequent reorientation and fall prevention. Of note patient has moved from [place] with her daughter 1 month ago, according to the daughter patient was having some memory issues for a while at [place]. she is unaware of having any behavioral issues there.   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)   Dysphagia   Hypertrophic cardiomyopathy (HCC)   Hypokalemia   CKD (chronic kidney disease), stage III (HCC)   Tobacco use disorder   Acute metabolic encephalopathy   Anemia of chronic disease   GERD (gastroesophageal reflux disease)   Bilateral lower extremity edema   Acute hypoxic respiratory failure due to COVID-19 pneumonitis , acute on chronic diastolic CHF exacerbation and RLL pneumonia - Patient has chronic  CO2 retention with advanced emphysema (CTA chest  emphysemia) - CTA chest, personally reviewed also showed large right lower lobe airspace opacity consistent with lobar pneumonia. --Urine strep pneumo antigen negative, urine Legionella antigen negative, MRSA screening negative,  initial procalcitonin less than 0.1,  -sputum sample does not appear has been collected, -She does has intermittent cough with congestion, lung sounds diminished but no wheezing, no rales -She was treated with Rocephin and Zithromax for 5 days from 2/25 to 3/1, then cefepime from 3/1, there is no fever, no leukocytosis,  - plan to finish total of 7 days of cefepime. -She also received iv lasix ,transitioned on oral lasix -she is still on HFNC , does not appear in respiratory distress, will continue cefepime and oral Lasix, -crp /ddimer elevated  started short course of steroid for post covid inflammatory lung changes. -Wean oxygen as tolerated, keep O2 above 88%. -Chest PT as tolerated. -Can discharge home once wean off HFNC and change to regular O2 supplement. 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  Hypertrophic Cardiomyopathy: Status post echo 02/26/2020 which showed severe concentric LVH with cavity obliteration of mid cavity and apex with systole c/w HCOM (see report).  Cardiology has been consulted and has recommended low-dose beta-blocker and avoidance of overdiuresis.  Also, avoid Imdur or Norvasc. Lasix Deescalated to 20 mg daily. Appears euvolemic   Lower extremity edema Doppler studies  negative for DVT. Likely due to underlying heart failure.  3/4 exam with trace  ankle and pedal edema, no significant leg edema, advised to elevate leg  Hypokalemia/hypomagnesemia Improved,  keep K above 4, mag above 2  CKD 2/anemia of chronic disease Creatinine and hemoglobin close to baseline. Monitor, renal dosing meds  Hypertension Currently on Norvasc, Lasix, Lopressor, monitor, adjust meds as needed.  Tobacco use disorder She was started on NicoDerm 21mg  on February 2, plan for total of 6 weeks then transition to 14 mg for 2 weeks then transition to 7 mg for 2 weeks then stop   Acute metabolic encephalopathy with baseline dementia Patient initially had homicidal ideations and was evaluated by psychiatry,  initial recommendation for inpatient Geri psych placement, patient has improved, psychiatry cleared patient on reeval on February 24  Continue Seroquel , continue sitter for frequent reorientation and fall risk prevention  Nutritional Assessment: The patient's BMI is: Body mass index is 17.1 kg/m.February 26 Seen by dietician.  I agree with the assessment and plan as outlined below: Nutrition Status: Nutrition Problem: Severe Malnutrition Etiology: chronic illness (COPD/CHF) Signs/Symptoms: severe fat depletion,severe muscle depletion Interventions: Ensure Enlive (each supplement provides 350kcal and 20 grams of protein),MVI,Prostat   DVT prophylaxis: Lovenox Code Status:  DNR Family Communication:  No family at bed side. Disposition Plan:  Status is: Inpatient  Remains inpatient appropriate because:Inpatient level of care appropriate due to severity of illness   Dispo:  Patient From: Home  Planned Disposition: Home with Home servics.  Medically stable for discharge: No   PT recommended SNF but daughter wants to take patient home with home services.     Consultants:   None  Procedures:  Antimicrobials:   Anti-infectives (From admission, onward)   Start     Dose/Rate Route Frequency Ordered Stop   03/23/20 1230  ceFEPIme (MAXIPIME) 2  g in sodium chloride 0.9 % 100 mL IVPB        2 g 200 mL/hr over 30 Minutes Intravenous Every 12 hours 03/23/20 1132 03/29/20 2156   03/21/20 2200  azithromycin (ZITHROMAX) tablet 500 mg        500 mg Oral Daily at bedtime 03/21/20 1333 03/23/20 2043   03/19/20 2100  azithromycin (ZITHROMAX) 500 mg in sodium chloride 0.9 % 250 mL IVPB  Status:  Discontinued        500 mg 250 mL/hr over 60 Minutes Intravenous Every 24 hours 03/19/20 1932 03/21/20 1333   03/19/20 2030  cefTRIAXone (ROCEPHIN) 2 g in sodium chloride 0.9 % 100 mL IVPB  Status:  Discontinued        2 g 200 mL/hr over 30 Minutes Intravenous Every 24 hours 03/19/20 1932 03/23/20 1114     Subjective: Patient was seen and examined at bedside.  Overnight events noted.  Patient is not in respiratory distress.   She is sitting comfortably but appears confused.  she is on 7 liters of oxygen, sats 92%.  Objective: Vitals:   03/30/20 0646 03/30/20 0936 03/30/20 1020 03/30/20 1253  BP: (!) 182/97  (!) 161/84 (!) 112/58  Pulse: 73  67 67  Resp: 16   18  Temp: 98.2 F (36.8 C)   97.8 F (36.6 C)  TempSrc: Oral   Oral  SpO2: 94% 98%  95%  Weight:      Height:        Intake/Output Summary (Last 24 hours) at 03/30/2020 1322 Last data filed at 03/30/2020 1100 Gross per 24 hour  Intake 360 ml  Output --  Net 360 ml   Filed Weights   02/26/20 1100 03/26/20 0546  Weight: 48 kg 46.6 kg    Examination:  General exam: Appears calm and comfortable, not in any acute distress. Respiratory system: Clear to auscultation. Respiratory effort normal. Cardiovascular system: S1 & S2 heard, RRR. No JVD, murmurs, rubs, gallops or clicks. No pedal edema. Gastrointestinal system: Abdomen is nondistended, soft and nontender. No organomegaly or masses felt. Normal bowel sounds heard. Central nervous system: Alert and oriented. No focal neurological deficits. Extremities: Symmetric 5 x 5 power, no edema, no cyanosis, no clubbing. Skin: No rashes,  lesions or ulcers Psychiatry: appears confused, no SI/HI    Data Reviewed: I have personally reviewed following labs and imaging studies  CBC: Recent Labs  Lab 03/24/20 0022 03/30/20 0132  WBC 5.9 7.4  NEUTROABS 5.3  --   HGB 10.1* 10.2*  HCT 33.5* 32.8*  MCV 94.4 92.4  PLT 278 339   Basic Metabolic Panel: Recent Labs  Lab 03/24/20 0022 03/25/20 0226 03/27/20 0210 03/28/20 0056 03/29/20 0247 03/29/20 1133 03/30/20 0132 03/30/20 0724  NA 139 142 139 140 142  --  140  --   K 3.1* 3.7 4.4 3.5 5.0 4.3 5.3* 4.6  CL 86* 89* 92* 90* 99  --  98  --   CO2 41* 41* 38* 42* 34*  --  37*  --   GLUCOSE 142* 109* 108* 96 97  --  96  --   BUN 23 24* 32* 23 32*  --  44*  --   CREATININE 1.06* 0.93 0.87 0.83 0.94  --  0.96  --   CALCIUM 9.1 9.7 9.4 9.1 9.2  --  9.4  --   MG 1.9  --  1.9  --   --   --  2.3  --   PHOS 3.2  --   --   --   --   --  2.8  --    GFR: Estimated Creatinine Clearance: 35 mL/min (by C-G formula based on SCr of 0.96 mg/dL). Liver Function Tests: Recent Labs  Lab 03/24/20 0022 03/30/20 0132  AST 22 25  ALT 29 20  ALKPHOS 87 53  BILITOT 0.4 0.7  PROT 5.9* 6.1*  ALBUMIN 2.0* 2.2*   No results for input(s): LIPASE, AMYLASE in the last 168 hours. No results for input(s): AMMONIA in the last 168 hours. Coagulation Profile: No results for input(s): INR, PROTIME in the last 168 hours. Cardiac Enzymes: No results for input(s): CKTOTAL, CKMB, CKMBINDEX, TROPONINI in the last 168 hours. BNP (last 3 results) No results for input(s): PROBNP in the last 8760 hours. HbA1C: No results for input(s): HGBA1C in the last 72 hours. CBG: Recent Labs  Lab 03/26/20 0735  GLUCAP 98   Lipid Profile: No results for input(s): CHOL, HDL, LDLCALC, TRIG, CHOLHDL, LDLDIRECT in the last 72 hours. Thyroid Function Tests: No results for input(s): TSH, T4TOTAL, FREET4, T3FREE, THYROIDAB in the last 72 hours. Anemia Panel: No results for input(s): VITAMINB12, FOLATE,  FERRITIN, TIBC, IRON, RETICCTPCT in the last 72 hours. Sepsis Labs: Recent Labs  Lab 03/27/20 0210 03/28/20 0056  PROCALCITON 0.41 0.28    Recent Results (from the past 240 hour(s))  MRSA PCR Screening     Status: None   Collection Time: 03/20/20  6:49 PM   Specimen: Nasopharyngeal  Result Value Ref Range Status   MRSA by PCR NEGATIVE NEGATIVE Final    Comment:  The GeneXpert MRSA Assay (FDA approved for NASAL specimens only), is one component of a comprehensive MRSA colonization surveillance program. It is not intended to diagnose MRSA infection nor to guide or monitor treatment for MRSA infections. Performed at [facility], [address] [place], [place] [address]      Radiology Studies: No results found.  Scheduled Meds: . (feeding supplement) PROSource Plus  30 mL Oral BID BM  . amLODipine  5 mg Oral Daily  . enoxaparin (LOVENOX) injection  30 mg Subcutaneous Q24H  . famotidine  20 mg Oral Daily  . furosemide  20 mg Oral Daily  . guaiFENesin  30 mL Oral BID  . ipratropium-albuterol  3 mL Nebulization TID  . metoprolol tartrate  100 mg Oral BID  . mometasone-formoterol  2 puff Inhalation BID  . multivitamin with minerals  1 tablet Oral Daily  . nicotine  21 mg Transdermal Daily  . potassium chloride  20 mEq Oral BID  . [START ON 04/01/2020] predniSONE  10 mg Oral Q breakfast  . [START ON 03/31/2020] predniSONE  20 mg Oral Q breakfast  . QUEtiapine  25 mg Oral BID  . saccharomyces boulardii  250 mg Oral BID  . senna-docusate  1 tablet Oral QHS   Continuous Infusions:    LOS: 33 days    Time spent: 25 mins    Cipriano Bunker, MD [facility]   If 7PM-7AM, please contact night-coverage

## 2020-03-31 MED ORDER — IPRATROPIUM-ALBUTEROL 0.5-2.5 (3) MG/3ML IN SOLN: 3.0000 mL | RESPIRATORY_TRACT | Status: DC | PRN

## 2020-03-31 NOTE — TOC Progression Note (Signed)
Transition of Care [facility]) - Progression Note    Patient Details  Name: Patricia Terrell MRN: [id number] Date of Birth: September 10, 1940  Transition [facility]) CM/SW Contact  Beckie Busing, RN Phone Number: [phone]  03/31/2020, 3:21 PM  Clinical Narrative:    Length of Need 12 Months  Patient has (list medical condition): weakness  The above medical condition requires: Patient requires the ability to reposition frequently  Head must be elevated greater than: 30 degrees  Bed type Semi-electric  Support Surface: Low Air loss Mattress           Expected Discharge Plan: Home w Home Health Services Barriers to Discharge: Continued Medical Work up  Expected Discharge Plan and Services Expected Discharge Plan: Home w Home Health Services In-house Referral: Clinical Social Work Discharge Planning Services: CM Consult Post Acute Care Choice: Home Health,Durable Medical Equipment Living arrangements for the past 2 months: Apartment                 DME Arranged: Oxygen DME Agency: [employer]       HH Arranged: RN,PT,OT,Nurse's Aide HH Agency: [facility] Date [facility] Agency Contacted: 03/28/20 Time HH Agency Contacted: [phone] Representative spoke with at [facility] Agency: Kandee Keen   Social Determinants of Health (SDOH) Interventions    Readmission Risk Interventions No flowsheet data found.
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
## 2020-03-31 NOTE — Progress Notes (Signed)
RT attempted to give nebulizer treatment, but pt kept pulling treatment off. Pt then started to get out of the bed. Nebulizer treatment was stopped and sitter redirected pt to lay back in the bed.

## 2020-03-31 NOTE — Progress Notes (Signed)
PROGRESS NOTE    Patricia Terrell  NUU:[id number] DOB: [birth year] DOA: 02/24/2020 PCP: Pcp, No    Brief Narrative: This 80 y.o.femalewith PMH significant for  dementia, COPD/emphysema, hypertension as well as other comorbidities was admitted from 02/13/20-02/16/20 for COPD exacerbation and was found to be Covid positive at that time, she was treated with 3 days of remdesivir and discharged home, she came back to the hospital on 02/26/2020 with shortness of breath. This time she was diagnosed with worsening COVID-19 pneumonia along with acute on chronic diastolic CHF and admitted to the hospital. She developed worsening encephalopathy and became aggressive, psych was consulted and currently she has been IVC to be placed to inpatient psych facility, patient has improved , and cleared by psychiatry after psychiatry reeval on February 24 , continued to have safety sitter at bedside for frequent reorientation and fall prevention. Of note patient has moved from [place] with her daughter 1 month ago, according to the daughter patient was having some memory issues for a while at [place]. she is unaware of having any behavioral issues there.   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)   Dysphagia   Hypertrophic cardiomyopathy (HCC)   Hypokalemia   CKD (chronic kidney disease), stage III (HCC)   Tobacco use disorder   Acute metabolic encephalopathy   Anemia of chronic disease   GERD (gastroesophageal reflux disease)   Bilateral lower extremity edema   Acute hypoxic respiratory failure due to COVID-19 pneumonitis , acute on chronic diastolic CHF exacerbation and RLL pneumonia - Patient has chronic  CO2 retention with advanced emphysema (CTA chest  emphysemia) - CTA chest, personally reviewed also showed large right lower lobe airspace opacity consistent with lobar pneumonia. --Urine strep pneumo antigen negative, urine Legionella antigen negative, MRSA screening negative,  initial procalcitonin less than 0.1,  -Sputum sample does not appear has been collected, -She does has intermittent cough with congestion, lung sounds diminished but no wheezing, no rales -She was treated with Rocephin and Zithromax for 5 days from 2/25 to 3/1, then cefepime from 3/1, there is no fever, no leukocytosis,  -Plan to finish total of 7 days of cefepime. -She also received iv lasix ,transitioned on oral lasix -she is still on HFNC , does not appear in respiratory distress, will continue cefepime and oral Lasix, -crp /ddimer elevated  started short course of steroid for post covid inflammatory lung changes. -Wean oxygen as tolerated, keep O2 above 88%. -Chest PT as tolerated. -Can discharge home once wean off HFNC and change to regular O2 supplement. -She is still requiring 6L HFNC. 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  Hypertrophic Cardiomyopathy: Status post echo 02/26/2020 which showed severe concentric LVH with cavity obliteration of mid cavity and apex with systole c/w HCOM (see report).  Cardiology has been consulted and has recommended low-dose beta-blocker and avoidance of overdiuresis.  Also, avoid Imdur or Norvasc. Lasix Deescalated to 20 mg daily. Appears euvolemic  Lower extremity edema Doppler studies negative for DVT.  Likely due to underlying heart failure.   Improving  Hypokalemia/hypomagnesemia Improved,  keep K above 4, mag above 2  CKD 2/anemia of chronic disease Creatinine and hemoglobin close to baseline. Monitor, renal dosing meds  Hypertension Currently on Norvasc, Lasix, Lopressor, monitor, adjust meds as needed.  Tobacco use disorder She was started on NicoDerm 21mg  on February 2, plan for total of 6 weeks then transition to 14 mg for 2 weeks then transition to 7 mg for 2 weeks then stop   Acute metabolic encephalopathy with baseline dementia Patient initially had homicidal ideations and was evaluated by psychiatry,  initial recommendation for inpatient Geri psych placement, patient has improved, psychiatry cleared patient on reeval on February 24  Continue Seroquel , continue sitter for frequent reorientation and fall risk prevention.  Nutritional Assessment: The patient's BMI is: Body mass index is 17.1 kg/m.February 26 Seen by dietician.  I agree with the assessment and plan as outlined below: Nutrition Status: Nutrition Problem: Severe Malnutrition Etiology: chronic illness (COPD/CHF) Signs/Symptoms: severe fat depletion,severe muscle depletion Interventions: Ensure Enlive (each supplement provides 350kcal and 20 grams of protein),MVI,Prostat   DVT prophylaxis: Lovenox Code Status:  DNR Family Communication:  No family at bed side. Disposition Plan:  Status is: Inpatient  Remains inpatient appropriate because:Inpatient level of care appropriate due to severity of illness   Dispo:  Patient From: Home  Planned Disposition: Home with Health Care Svc with Home servics.  Medically stable for discharge: No   PT recommended SNF but daughter wants to take patient home with home services.  Patient is a still requiring 6 L of supplemental oxygen with high flow nasal cannula.  Wean as tolerated.    Consultants:   None  Procedures:  Antimicrobials:   Anti-infectives (From admission, onward)   Start     Dose/Rate  Route Frequency Ordered Stop   03/23/20 1230  ceFEPIme (MAXIPIME) 2 g in sodium chloride 0.9 % 100 mL IVPB        2 g 200 mL/hr over 30 Minutes Intravenous Every 12 hours 03/23/20 1132 03/29/20 2156   03/21/20 2200  azithromycin (ZITHROMAX) tablet 500 mg        500 mg Oral Daily at bedtime 03/21/20 1333 03/23/20 2043   03/19/20 2100  azithromycin (ZITHROMAX) 500 mg in sodium chloride 0.9 % 250 mL IVPB  Status:  Discontinued        500 mg 250 mL/hr over 60 Minutes Intravenous Every 24 hours 03/19/20 1932 03/21/20 1333   03/19/20 2030  cefTRIAXone (ROCEPHIN) 2 g in sodium chloride 0.9 % 100 mL IVPB  Status:  Discontinued        2 g 200 mL/hr over 30 Minutes Intravenous Every 24 hours 03/19/20 1932 03/23/20 1114     Subjective: Patient was seen and examined at bedside.  Overnight events noted.  Patient is not in respiratory distress.   She is sitting comfortably but appears confused.  Still requiring 6 liters of HF oxygen, sats 92%.  Objective: Vitals:   03/30/20 1956 03/30/20 2045 03/31/20 0600 03/31/20 0651  BP:  124/65  127/63  Pulse:  77  (!) 50  Resp:  16  14  Temp:  98.4 F (36.9 C)  98.7 F (37.1 C)  TempSrc:    Oral  SpO2: 96% 96%  100%  Weight:   50.4 kg   Height:        Intake/Output Summary (Last 24 hours) at 03/31/2020 1333 Last data filed at 03/30/2020 1600 Gross per 24 hour  Intake 200 ml  Output 200 ml  Net 0 ml   Filed Weights   02/26/20 1100 03/26/20 0546 03/31/20 0600  Weight: 48 kg 46.6 kg 50.4 kg    Examination:  General exam: Appears calm and comfortable, not in any acute distress. Respiratory system: Clear to auscultation. Respiratory effort normal. Cardiovascular system: S1 & S2 heard, RRR. No JVD, murmurs, rubs, gallops or clicks. No pedal edema. Gastrointestinal system: Abdomen is nondistended, soft and nontender. No organomegaly or masses felt. Normal bowel sounds heard. Central nervous system: Alert and oriented. No focal neurological  deficits. Extremities: Symmetric 5 x 5 power, no edema, no cyanosis, no clubbing. Skin: No rashes, lesions or ulcers Psychiatry: appears confused, no SI/HI    Data Reviewed: I have personally reviewed following labs and imaging studies  CBC: Recent Labs  Lab 03/30/20 0132  WBC 7.4  HGB 10.2*  HCT 32.8*  MCV 92.4  PLT 339   Basic Metabolic Panel: Recent Labs  Lab 03/25/20 0226 03/27/20 0210 03/28/20 0056 03/29/20 0247 03/29/20 1133 03/30/20 0132 03/30/20 0724  NA 142 139 140 142  --  140  --   K 3.7 4.4 3.5 5.0 4.3 5.3* 4.6  CL 89* 92* 90* 99  --  98  --   CO2 41* 38* 42* 34*  --  37*  --   GLUCOSE 109* 108* 96 97  --  96  --   BUN 24* 32* 23 32*  --  44*  --   CREATININE 0.93 0.87 0.83 0.94  --  0.96  --   CALCIUM 9.7 9.4 9.1 9.2  --  9.4  --   MG  --  1.9  --   --   --  2.3  --   PHOS  --   --   --   --   --  2.8  --    GFR: Estimated Creatinine Clearance: 37.8 mL/min (by C-G formula based on SCr of 0.96 mg/dL). Liver Function Tests: Recent Labs  Lab 03/30/20 0132  AST 25  ALT 20  ALKPHOS 53  BILITOT 0.7  PROT 6.1*  ALBUMIN 2.2*   No results for input(s): LIPASE, AMYLASE in the last 168 hours. No results for input(s): AMMONIA in the last 168 hours. Coagulation Profile: No results for input(s): INR, PROTIME in the last 168 hours. Cardiac Enzymes: No results for input(s): CKTOTAL, CKMB, CKMBINDEX, TROPONINI in the last 168 hours. BNP (last 3 results) No results for input(s): PROBNP in the last 8760 hours. HbA1C: No results for input(s): HGBA1C in the last 72 hours. CBG: Recent Labs  Lab 03/26/20 0735  GLUCAP 98   Lipid Profile: No results for input(s): CHOL, HDL, LDLCALC, TRIG, CHOLHDL, LDLDIRECT in the last 72 hours. Thyroid Function Tests: No results for input(s): TSH, T4TOTAL, FREET4, T3FREE, THYROIDAB in the last 72 hours. Anemia Panel: No results for input(s): VITAMINB12, FOLATE, FERRITIN, TIBC, IRON, RETICCTPCT in the last 72  hours. Sepsis Labs: Recent Labs  Lab 03/27/20 0210 03/28/20 0056  PROCALCITON 0.41 0.28    No results found for this or any previous visit (from the past 240 hour(s)).   Radiology Studies: No results found.  Scheduled Meds: . (feeding supplement) PROSource Plus  30 mL Oral BID BM  . amLODipine  5 mg Oral Daily  . enoxaparin (LOVENOX) injection  30 mg Subcutaneous Q24H  . famotidine  20 mg Oral Daily  . furosemide  20 mg Oral Daily  . guaiFENesin  30 mL Oral  BID  . metoprolol tartrate  100 mg Oral BID  . mometasone-formoterol  2 puff Inhalation BID  . multivitamin with minerals  1 tablet Oral Daily  . nicotine  21 mg Transdermal Daily  . potassium chloride  20 mEq Oral BID  . [START ON 04/01/2020] predniSONE  10 mg Oral Q breakfast  . QUEtiapine  25 mg Oral BID  . saccharomyces boulardii  250 mg Oral BID  . senna-docusate  1 tablet Oral QHS   Continuous Infusions:    LOS: 34 days    Time spent: 25 mins    Cipriano Bunker, MD [facility]   If 7PM-7AM, please contact night-coverage

## 2020-04-01 LAB — BASIC METABOLIC PANEL
Anion gap: 5 (ref 5–15)
BUN: 48 mg/dL — ABNORMAL HIGH (ref 8–23)
CO2: 37 mmol/L — ABNORMAL HIGH (ref 22–32)
Calcium: 9.2 mg/dL (ref 8.9–10.3)
Chloride: 97 mmol/L — ABNORMAL LOW (ref 98–111)
Creatinine, Ser: 1.22 mg/dL — ABNORMAL HIGH (ref 0.44–1.00)
GFR, Estimated: 45 mL/min — ABNORMAL LOW (ref >60)
Glucose, Bld: 95 mg/dL (ref 70–99)
Potassium: 5.1 mmol/L (ref 3.5–5.1)
Sodium: 139 mmol/L (ref 135–145)

## 2020-04-01 NOTE — Progress Notes (Signed)
PROGRESS NOTE    Patricia Terrell  VHQ:[id number] DOB: [birth year] DOA: 02/24/2020 PCP: Pcp, No    Brief Narrative: This 80 y.o.femalewith PMH significant for  dementia, COPD/emphysema, hypertension as well as other comorbidities was admitted from 02/13/20-02/16/20 for COPD exacerbation and was found to be Covid positive at that time, she was treated with 3 days of remdesivir and discharged home, she came back to the hospital on 02/26/2020 with shortness of breath. This time she was diagnosed with worsening COVID-19 pneumonia along with acute on chronic diastolic CHF and admitted to the hospital. She developed worsening encephalopathy and became aggressive, psych was consulted and currently she has been IVC to be placed to inpatient psych facility, patient has improved , and cleared by psychiatry after psychiatry reeval on February 24 , continued to have safety sitter at bedside for frequent reorientation and fall prevention. Of note patient has moved from [place] with her daughter 1 month ago, according to the daughter patient was having some memory issues for a while at [place]. she is unaware of having any behavioral issues there.   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)   Dysphagia   Hypertrophic cardiomyopathy (HCC)   Hypokalemia   CKD (chronic kidney disease), stage III (HCC)   Tobacco use disorder   Acute metabolic encephalopathy   Anemia of chronic disease   GERD (gastroesophageal reflux disease)   Bilateral lower extremity edema   Acute hypoxic respiratory failure due to COVID-19 pneumonitis , acute on chronic diastolic CHF exacerbation and RLL pneumonia - Patient has chronic  CO2 retention with advanced emphysema (CTA chest  emphysemia) - CTA chest, personally reviewed also showed large right lower lobe airspace opacity consistent with lobar pneumonia. --Urine strep pneumo antigen negative, urine Legionella antigen negative, MRSA screening negative,  initial procalcitonin less than 0.1,  -Sputum sample does not appear has been collected, -She does has intermittent cough with congestion, lung sounds diminished but no wheezing, no rales -She was treated with Rocephin and Zithromax for 5 days from 2/25 to 3/1, then cefepime from 3/1, there is no fever, no leukocytosis,  -Plan to finish total of 7 days of cefepime. -She also received iv lasix ,transitioned on oral lasix -she is still on HFNC , does not appear in respiratory distress, will continue cefepime and oral Lasix, -crp /ddimer elevated  started short course of steroid for post covid inflammatory lung changes. -Wean oxygen as tolerated, keep O2 above 88%. -Chest PT as tolerated. -Can discharge home once wean off HFNC and change to regular O2 supplement. -She is still requiring 6L HFNC, wean as tolerated. 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  Hypertrophic Cardiomyopathy: Status post echo 02/26/2020 which showed severe concentric LVH with cavity obliteration of mid cavity and apex with systole c/w HCOM (see report).  Cardiology has been consulted and has recommended low-dose beta-blocker and avoidance of overdiuresis.  Also, avoid Imdur or Norvasc. Lasix Deescalated to 20 mg daily. Appears euvolemic  Lower extremity edema Doppler studies  negative for DVT. Likely due to underlying heart failure.   Improving  Hypokalemia/hypomagnesemia Improved,  keep K above 4, mag above 2  CKD 2/anemia of chronic disease Creatinine and hemoglobin close to baseline. Monitor, renal dosing meds  Hypertension Currently on Norvasc, Lasix, Lopressor, monitor, adjust meds as needed.  Tobacco use disorder She was started on NicoDerm 21mg  on February 2, plan for total of 6 weeks then transition to 14 mg for 2 weeks then transition to 7 mg for 2 weeks then stop  Acute metabolic encephalopathy with baseline dementia Patient initially had homicidal ideations and was evaluated by psychiatry,  initial recommendation for inpatient Geri psych placement, patient has improved, psychiatry cleared patient on reeval on February 24  Continue Seroquel , continue sitter for frequent reorientation and fall risk prevention.  Nutritional Assessment: The patient's BMI is: Body mass index is 17.1 kg/m.February 26 Seen by dietician.  I agree with the assessment and plan as outlined below: Nutrition Status: Nutrition Problem: Severe Malnutrition Etiology: chronic illness (COPD/CHF) Signs/Symptoms: severe fat depletion,severe muscle depletion Interventions: Ensure Enlive (each supplement provides 350kcal and 20 grams of protein),MVI,Prostat   DVT prophylaxis: Lovenox Code Status:  DNR Family Communication:  No family at bed side. Disposition Plan:  Status is: Inpatient  Remains inpatient appropriate because:Inpatient level of care appropriate due to severity of illness   Dispo:  Patient From: Home  Planned Disposition: Home with Health Care Svc with Home servics.  Medically stable for discharge: No   PT recommended SNF but daughter wants to take patient home with home services.  Patient is a still requiring 6 L of supplemental oxygen with high flow nasal cannula.  Wean as tolerated.    Consultants:   None  Procedures:  Antimicrobials:   Anti-infectives (From admission, onward)   Start     Dose/Rate  Route Frequency Ordered Stop   03/23/20 1230  ceFEPIme (MAXIPIME) 2 g in sodium chloride 0.9 % 100 mL IVPB        2 g 200 mL/hr over 30 Minutes Intravenous Every 12 hours 03/23/20 1132 03/29/20 2156   03/21/20 2200  azithromycin (ZITHROMAX) tablet 500 mg        500 mg Oral Daily at bedtime 03/21/20 1333 03/23/20 2043   03/19/20 2100  azithromycin (ZITHROMAX) 500 mg in sodium chloride 0.9 % 250 mL IVPB  Status:  Discontinued        500 mg 250 mL/hr over 60 Minutes Intravenous Every 24 hours 03/19/20 1932 03/21/20 1333   03/19/20 2030  cefTRIAXone (ROCEPHIN) 2 g in sodium chloride 0.9 % 100 mL IVPB  Status:  Discontinued        2 g 200 mL/hr over 30 Minutes Intravenous Every 24 hours 03/19/20 1932 03/23/20 1114     Subjective: Patient was seen and examined at bedside.  Overnight events noted.  Patient doesn't appear to be in respiratory distress.   She is sitting comfortably but appears confused.  Still requiring 6 liters of HF oxygen, sats 92%.  Objective: Vitals:   04/01/20 0300 04/01/20 0500 04/01/20 0852 04/01/20 0902  BP: (!) 150/60   133/71  Pulse: 95   (!) 57  Resp: 16   19  Temp: 98 F (36.7 C)   98 F (36.7 C)  TempSrc: Oral   Oral  SpO2: 96%  95% 94%  Weight:  50.6 kg    Height:        Intake/Output Summary (Last 24 hours) at 04/01/2020 1310 Last data filed at 04/01/2020  1100 Gross per 24 hour  Intake 360 ml  Output --  Net 360 ml   Filed Weights   03/26/20 0546 03/31/20 0600 04/01/20 0500  Weight: 46.6 kg 50.4 kg 50.6 kg    Examination:  General exam: Appears calm and comfortable, not in any acute distress. Respiratory system: Clear to auscultation. Respiratory effort normal. Cardiovascular system: S1 & S2 heard, RRR. No JVD, murmurs, rubs, gallops or clicks. No pedal edema. Gastrointestinal system: Abdomen is nondistended, soft and nontender. No organomegaly or masses felt. Normal bowel sounds heard. Central nervous system: Alert and oriented. No focal  neurological deficits. Extremities: Symmetric 5 x 5 power, no edema, no cyanosis, no clubbing. Skin: No rashes, lesions or ulcers Psychiatry: appears confused, no SI/HI    Data Reviewed: I have personally reviewed following labs and imaging studies  CBC: Recent Labs  Lab 03/30/20 0132  WBC 7.4  HGB 10.2*  HCT 32.8*  MCV 92.4  PLT 339   Basic Metabolic Panel: Recent Labs  Lab 03/27/20 0210 03/28/20 0056 03/29/20 0247 03/29/20 1133 03/30/20 0132 03/30/20 0724 04/01/20 0158  NA 139 140 142  --  140  --  139  K 4.4 3.5 5.0 4.3 5.3* 4.6 5.1  CL 92* 90* 99  --  98  --  97*  CO2 38* 42* 34*  --  37*  --  37*  GLUCOSE 108* 96 97  --  96  --  95  BUN 32* 23 32*  --  44*  --  48*  CREATININE 0.87 0.83 0.94  --  0.96  --  1.22*  CALCIUM 9.4 9.1 9.2  --  9.4  --  9.2  MG 1.9  --   --   --  2.3  --   --   PHOS  --   --   --   --  2.8  --   --    GFR: Estimated Creatinine Clearance: 29.9 mL/min (A) (by C-G formula based on SCr of 1.22 mg/dL (H)). Liver Function Tests: Recent Labs  Lab 03/30/20 0132  AST 25  ALT 20  ALKPHOS 53  BILITOT 0.7  PROT 6.1*  ALBUMIN 2.2*   No results for input(s): LIPASE, AMYLASE in the last 168 hours. No results for input(s): AMMONIA in the last 168 hours. Coagulation Profile: No results for input(s): INR, PROTIME in the last 168 hours. Cardiac Enzymes: No results for input(s): CKTOTAL, CKMB, CKMBINDEX, TROPONINI in the last 168 hours. BNP (last 3 results) No results for input(s): PROBNP in the last 8760 hours. HbA1C: No results for input(s): HGBA1C in the last 72 hours. CBG: Recent Labs  Lab 03/26/20 0735  GLUCAP 98   Lipid Profile: No results for input(s): CHOL, HDL, LDLCALC, TRIG, CHOLHDL, LDLDIRECT in the last 72 hours. Thyroid Function Tests: No results for input(s): TSH, T4TOTAL, FREET4, T3FREE, THYROIDAB in the last 72 hours. Anemia Panel: No results for input(s): VITAMINB12, FOLATE, FERRITIN, TIBC, IRON, RETICCTPCT in the  last 72 hours. Sepsis Labs: Recent Labs  Lab 03/27/20 0210 03/28/20 0056  PROCALCITON 0.41 0.28    No results found for this or any previous visit (from the past 240 hour(s)).   Radiology Studies: No results found.  Scheduled Meds: . (feeding supplement) PROSource Plus  30 mL Oral BID BM  . amLODipine  5 mg Oral Daily  . enoxaparin (LOVENOX) injection  30 mg Subcutaneous Q24H  . famotidine  20 mg Oral Daily  . furosemide  20 mg Oral Daily  .  guaiFENesin  30 mL Oral BID  . metoprolol tartrate  100 mg Oral BID  . mometasone-formoterol  2 puff Inhalation BID  . multivitamin with minerals  1 tablet Oral Daily  . nicotine  21 mg Transdermal Daily  . QUEtiapine  25 mg Oral BID  . saccharomyces boulardii  250 mg Oral BID  . senna-docusate  1 tablet Oral QHS   Continuous Infusions:    LOS: 35 days    Time spent: 25 mins    Cipriano Bunker, MD [facility]   If 7PM-7AM, please contact night-coverage

## 2020-04-01 NOTE — Progress Notes (Signed)
  Speech Language Pathology Treatment: Dysphagia  Patient Details Name: Patricia Terrell MRN: [id number] DOB: July 29, 1940 Today's Date: 04/01/2020 Time: 0350-0938 SLP Time Calculation (min) (ACUTE ONLY): 15 min  Assessment / Plan / Recommendation Clinical Impression  Pt was seen for dysphagia treatment. She was alert and cooperative during the session. Pt's RN, Darin Engels, and the NT who was sitting with her both reported that the pt has been tolerating her current diet without overt s/sx of aspiration and that her p.o. intake has improved. Mastication time was [facility] despite reduced dentition and oral clearance was adequate. She exhibited coughing with consecutive swallows of thin liquids via cup. She required moderate cues to reduce intake rate. Pt was able to demonstrate 3-second prep tasks with 80% accuracy with purees and with 50% accuracy with thin liquids via cup. Throat clearing was inconsistently noted when a delay was suspected and a wet vocal quality was subsequently observed. With prompted coughing, pt's vocal quality was returned to baseline. Pt's mentation appears improved compared to when she was last seen by this SLP and this may have a positive impact on her swallowing. 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 It is recommended that her current diet of regular texture solids and nectar thick liquids be continued. SLP will continue to follow for treatment.    HPI HPI: Patient is a 81 y.o. female with PMH: dementia, COPD with emphysema, HTN, admitted 1/21-1/24/22 for COPD exacerbation then found to be COVID-19 positive and treated at home. She was then readmitted 2/3 for SOB and was diagnosed with worsening COVID-19 PNA and chronic diastolic CHF and was admitted to hospital during which time she developed worsening encephalopathy and became aggressive; psych was consulted and she has been IVC to be  placed in inpatient psych facility. Patient moved from [place] with her daughter one month ago.      SLP Plan  Continue with current plan of care       Recommendations  Liquids provided via: Cup Medication Administration: Whole meds with puree Supervision: Patient able to self feed;Full supervision/cueing for compensatory strategies Compensations: Minimize environmental distractions;Slow rate;Small sips/bites Postural Changes and/or Swallow Maneuvers: Seated upright 90 degrees;Upright 30-60 min after meal                Oral Care Recommendations: Oral care BID Follow up Recommendations: None SLP Visit Diagnosis: Dysphagia, oropharyngeal phase (R13.12) Plan: Continue with current plan of care       Shanika I. Vear Clock, MS, CCC-SLP Acute Rehabilitation Services Office number [phone] Pager [phone]                 Scheryl Marten 04/01/2020, 4:16 PM

## 2020-04-01 NOTE — Progress Notes (Signed)
SATURATION QUALIFICATIONS: (This note is used to comply with regulatory documentation for home oxygen)  Patient Saturations on Room Air at Rest = 80%  Patient Saturations on Room Air while Ambulating = 76%  Patient Saturations on 6 Liters of oxygen while Ambulating = 94%  Please briefly explain why patient needs home oxygen: At rest pt needs 4L SPO2 93. Pt needs about 6 Litres with distance over 5 feet.

## 2020-04-02 LAB — BASIC METABOLIC PANEL
Anion gap: 6 (ref 5–15)
BUN: 40 mg/dL — ABNORMAL HIGH (ref 8–23)
CO2: 38 mmol/L — ABNORMAL HIGH (ref 22–32)
Calcium: 9.1 mg/dL (ref 8.9–10.3)
Chloride: 96 mmol/L — ABNORMAL LOW (ref 98–111)
Creatinine, Ser: 1.02 mg/dL — ABNORMAL HIGH (ref 0.44–1.00)
GFR, Estimated: 56 mL/min — ABNORMAL LOW (ref >60)
Glucose, Bld: 85 mg/dL (ref 70–99)
Potassium: 4.3 mmol/L (ref 3.5–5.1)
Sodium: 140 mmol/L (ref 135–145)

## 2020-04-02 LAB — HEMOGLOBIN AND HEMATOCRIT, BLOOD
HCT: 29.9 % — ABNORMAL LOW (ref 36.0–46.0)
Hemoglobin: 9.5 g/dL — ABNORMAL LOW (ref 12.0–15.0)

## 2020-04-02 MED ORDER — FUROSEMIDE 20 MG PO TABS: 20.0000 mg | ORAL_TABLET | Freq: Every day | ORAL | 1 refills | Status: AC

## 2020-04-02 MED ORDER — METOPROLOL TARTRATE 100 MG PO TABS: 100.0000 mg | ORAL_TABLET | Freq: Two times a day (BID) | ORAL | 1 refills | Status: AC

## 2020-04-02 MED ORDER — ALPRAZOLAM 0.25 MG PO TABS: 0.2500 mg | ORAL_TABLET | Freq: Two times a day (BID) | ORAL | 0 refills | Status: AC | PRN

## 2020-04-02 NOTE — Discharge Summary (Signed)
Physician Discharge Summary  Patricia Terrell GQB:[id number] DOB: 10-Apr-1940 DOA: 02/24/2020  PCP: Pcp, No  Admit date: 02/24/2020.  Discharge date: 04/02/2020  Admitted From:  Home.  Disposition:  Home with home services  Recommendations for Outpatient Follow-up:  1. Follow up with PCP in 1-2 weeks. 2. Please obtain BMP/CBC in one week. 3. Advised to follow-up with psychiatrist as scheduled,  appointment has been made. 4. Advised to take metoprolol 100 mg twice daily. 5. Advised to continue supplemental oxygen as required.  Home Health: Home PT/OT Equipment/Devices: Oxygen @ 5-6 L/M  Discharge Condition: Stable CODE STATUS:DNR Diet recommendation: Heart Healthy   Brief Summary / Hospital Course : This 80 y.o.femalewith PMH significant for  dementia, COPD/emphysema, hypertension as well as other comorbidities was admitted from 02/13/20-02/16/20 for COPD exacerbation and was found to be Covid positive at that time, she was treated with 3 days of remdesivir and discharged home, She came back to the hospital on 02/26/2020 with shortness of breath. This time she was diagnosed with worsening COVID-19 pneumonia along with acute on chronic diastolic CHF and was admitted to the hospital. She has developed worsening encephalopathy and became aggressive, psych was consulted and she had been IVC to be placed to inpatient psych facility, Patient has improved, and was cleared by psychiatry after psychiatry reeval on February 24 , continued to have safety sitter at bedside for frequent reorientation and fall prevention.  Patient continued to remain on high flow nasal cannula for hypoxic respiratory failure.  She is weaned down to 4 L of supplemental oxygen via nasal cannula.  She has ambulated well denies any chest pain or shortness of breath.  PT recommended skilled nursing facility but daughter wants to take her home with home services.  Home health services been arranged.  Patient is being discharged home  with home services.  Of note patient has moved from [place] with her daughter 1 month ago, according to the daughter patient was having some memory issues for a while at [place]. she is unaware of having any behavioral issues there.  She was managed for below problems.  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 --Urine strep pneumo antigen negative, urine Legionella antigen negative, MRSA screening negative, initial procalcitonin less than 0.1,  -Sputum sample does not appear has been collected, -She does has intermittent cough with congestion, lung sounds diminished but no wheezing, no rales -She was treated with Rocephin and Zithromax for 5 days from 2/25 to 3/1, then cefepime from 3/1, there is no fever, no leukocytosis, -Plan to finish total of 7 days of cefepime. She completed antibiotics. -She also received iv lasix ,transitioned on oral lasix -she is still on HFNC,does not appear  in respiratory distress, will continue cefepime and oral Lasix, -crp /ddimer elevated started short course  of steroid for post covid inflammatory lung changes. -Wean oxygen as tolerated, keep O2 above 88%. -Chest PT as tolerated. -Can discharge home once wean off HFNC and change to regular O2 supplement. -She is still requiring 6L HFNC, wean as tolerated. 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  Patient is being discharged home today.   Dysphagia > Improved -modified barium swallow which was performed 03/22/2020 -Currently on regular diet with nectar thick liquid per speech eval -Continue aspiration precaution  Gastroesophageal reflux disease Continue Pepcid.  Hypertrophic Cardiomyopathy: Status post echo 02/26/2020 which showed severe concentric LVH with cavity obliteration of mid cavity and apex with systole c/w HCOM (see report).  Cardiology has been consulted and has recommended low-dose beta-blocker and avoidance of overdiuresis.  Also, avoid Imdur or Norvasc. Lasix Deescalated to 20 mg daily. Appears euvolemic  Lower extremity edema Doppler studies negative for DVT. Likely due to underlying heart failure.  Improving  Hypokalemia/hypomagnesemia Improved,  keep K above 4, mag above 2  CKD 2/anemia of chronic disease Creatinine and hemoglobin close to baseline. Monitor, renal dosing meds  Hypertension Currently on Norvasc, Lasix, Lopressor, monitor, adjust meds as needed.  Tobacco use disorder She was started on NicoDerm  on February 2, plan for total of 6 weeks then transition to 14 mg for 2 weeks then transition to 7 mg for 2 weeks then stop  Acute metabolic encephalopathy with baseline dementia Patient initially had homicidal ideations and was evaluated by psychiatry,  initial recommendation for inpatient Geri psych placement, patient has improved, psychiatry cleared patient on reeval on February 24   Continue Seroquel , continue sitter for frequent reorientation and fall risk prevention.  Nutritional Assessment: The patient's BMI is:Body mass index is 17.1 kg/m.Marland Kitchen Seen by dietician. I agree with the assessment and plan as outlined below: Nutrition Status: Nutrition Problem: Severe Malnutrition Etiology: chronic illness (COPD/CHF) Signs/Symptoms: severe fat depletion,severe muscle depletion Interventions: Ensure Enlive (each supplement provides 350kcal and 20 grams of protein),MVI,Prostat   Discharge Instructions  Discharge Instructions    Call MD for:  difficulty breathing, headache or visual disturbances   Complete by: As directed    Call MD for:  persistant dizziness or light-headedness   Complete by: As directed    Call MD for:  persistant nausea and vomiting   Complete by: As directed    Diet - low sodium heart healthy   Complete by: As directed    Nectar thick liquid, aspiration precaution   Diet - low sodium heart healthy   Complete by: As directed    Diet Carb Modified   Complete by: As directed    Discharge instructions   Complete by: As directed    Advised to follow-up with primary care physician in 1 week.   Advised to follow-up with psychiatrist appointment has been made. Advised to take metoprolol 100 mg twice daily. Advised to continue supplemental oxygen is required.   Increase activity slowly   Complete by: As directed    Increase activity slowly   Complete by: As directed      Allergies as of 04/02/2020   No Known Allergies     Medication List    STOP taking these medications   predniSONE 20 MG tablet Commonly known as: DELTASONE  TAKE these medications   ALPRAZolam 0.25 MG tablet Commonly known as: Xanax Take 1 tablet (0.25 mg total) by mouth 2 (two) times daily as needed for anxiety.   famotidine 20 MG tablet Commonly known as: PEPCID Take 1 tablet (20 mg total) by mouth daily for 14 days.   furosemide 20 MG tablet Commonly  known as: LASIX Take 1 tablet (20 mg total) by mouth daily. Start taking on: April 03, 2020   lisinopril 10 MG tablet Commonly known as: ZESTRIL Take 1 tablet (10 mg total) by mouth daily.   metoprolol tartrate 100 MG tablet Commonly known as: LOPRESSOR Take 1 tablet (100 mg total) by mouth 2 (two) times daily.   mometasone-formoterol 200-5 MCG/ACT Aero Commonly known as: DULERA Inhale 2 puffs into the lungs 2 (two) times daily.   QUEtiapine 25 MG tablet Commonly known as: SEROQUEL Take 1 tablet (25 mg total) by mouth at bedtime.            Durable Medical Equipment  (From admission, onward)         Start     Ordered   04/02/20 1018  For home use only DME oxygen  Once       Question Answer Comment  Length of Need 12 Months   Mode or (Route) Nasal cannula   Liters per Minute 4   Frequency Continuous (stationary and portable oxygen unit needed)   Oxygen conserving device No   Oxygen delivery system Gas      04/02/20 1018   03/31/20 0901  For home use only DME Hospital bed  Once       Question Answer Comment  Length of Need 12 Months   Patient has (list medical condition): weakness   The above medical condition requires: Patient requires the ability to reposition frequently   Head must be elevated greater than: 30 degrees   Bed type Semi-electric   Support Surface: Low Air loss Mattress      03/31/20 0901   03/31/20 0855  For home use only DME 3 n 1  Once        03/31/20 0901   03/26/20 1812  DME Oxygen  Once       Question Answer Comment  Length of Need 6 Months   Mode or (Route) Nasal cannula   Liters per Minute 4   Frequency Continuous (stationary and portable oxygen unit needed)   Oxygen conserving device Yes   Oxygen delivery system Gas      03/26/20 1812          Follow-up Information    [facility] Follow up in 1 week(s).   Specialty: Cardiology Why: present to office for lab work, does not need to be fasting Contact  information: [address] [place] [address] [phone]       Marcine Matar, MD Follow up.   Specialty: Internal Medicine Why: appointment April 8 made by daughter  Contact information: [address] [place] [address] [phone]        [facility] Follow up.   Specialty: Home Health Services Why: For home health services. They will call you within 1-2 days to set up a home appointment Contact information: [address] [place] [address] [phone]        Antonieta Pert, MD Follow up in 2 week(s).   Specialty: Psychiatry Contact information: [address] [place] [address] [phone]  No Known Allergies  Consultations:  Psychiatry  Pulmonology   Procedures/Studies: CT ANGIO CHEST PE W OR WO CONTRAST  Result Date: 03/19/2020 CLINICAL DATA:  Positive D-dimer level. EXAM: CT ANGIOGRAPHY CHEST WITH CONTRAST TECHNIQUE: Multidetector CT imaging of the chest was performed using the standard protocol during bolus administration of intravenous contrast. Multiplanar CT image reconstructions and MIPs were obtained to evaluate the vascular anatomy. CONTRAST:  58mL OMNIPAQUE IOHEXOL 350 MG/ML SOLN COMPARISON:  February 13, 2020. FINDINGS: Cardiovascular: Satisfactory opacification of the pulmonary arteries to the segmental level. No evidence of pulmonary embolism. Mild cardiomegaly is noted. No pericardial effusion. Atherosclerosis of thoracic aorta is noted without aneurysm formation. Enlargement of pulmonary arteries is noted suggesting pulmonary artery hypertension. Mediastinum/Nodes: No enlarged mediastinal, hilar, or axillary lymph nodes. Thyroid gland, trachea, and esophagus demonstrate no significant findings. Lungs/Pleura: No pneumothorax is noted. Emphysematous disease is noted in both lungs. Large right lower lobe airspace opacity is noted concerning for  pneumonia. Minimal bilateral pleural effusions are noted with adjacent subsegmental atelectasis. 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 Upper Abdomen: No acute abnormality. Musculoskeletal: No chest wall abnormality. No acute or significant osseous findings. Review of the MIP images confirms the above findings. IMPRESSION: 1. No definite evidence of pulmonary embolus. 2. Enlargement of pulmonary arteries is noted suggesting pulmonary artery hypertension. 3. Large right lower lobe airspace opacity is noted concerning for pneumonia. 4. Minimal bilateral pleural effusions are noted with adjacent subsegmental atelectasis. 5. 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 6. Emphysema and aortic atherosclerosis. Aortic Atherosclerosis (ICD10-I70.0) and Emphysema (ICD10-J43.9). Electronically Signed   By: Lupita Raider M.D.   On: 03/19/2020 18:14   DG CHEST PORT 1 VIEW  Result Date: 03/23/2020 CLINICAL DATA:  Shortness of breath EXAM: PORTABLE CHEST 1 VIEW COMPARISON:  03/22/2020 FINDINGS: Cardiac shadow remains enlarged. Aortic calcifications are again seen. Scattered calcified pleural plaques are again identified bilaterally. Diffuse emphysematous changes are noted as well as persistent consolidation in the right lower lobe stable from prior exams. Given some technical variations in the imaging the overall appearance is relatively stable. No bony abnormality is seen. 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 Underlying interstitial changes and right lower lobe consolidation are noted. Electronically Signed   By: Alcide Clever M.D.   On: 03/23/2020 08:07   DG CHEST PORT 1 VIEW  Result Date: 03/22/2020 CLINICAL DATA:  Shortness of breath and hypoxia. EXAM: PORTABLE CHEST 1 VIEW COMPARISON:  03/21/2020 and CTA of the chest on 03/19/2020 FINDINGS: Stable cardiac enlargement and aortic tortuosity. Superimposed on chronic  lung disease is some increase in interstitial prominence and pulmonary vascularity likely representing pulmonary interstitial edema. Multiple areas of bilateral pleural calcification again noted. No pleural effusions or pneumothorax identified. IMPRESSION: 1. Increase in interstitial prominence and pulmonary vascularity likely representing acute pulmonary interstitial edema superimposed on chronic lung disease. 2. Stable cardiomegaly and aortic tortuosity. Electronically Signed   By: Irish Lack M.D.   On: 03/22/2020 14:23   DG CHEST PORT 1 VIEW  Result Date: 03/21/2020 CLINICAL DATA:  COPD exacerbation. EXAM: PORTABLE CHEST 1 VIEW COMPARISON:  03/19/2020 and CT 03/19/2020 FINDINGS: Lungs are adequately inflated demonstrate stable patchy airspace opacification over the right mid and lower lung as well as left base as seen on recent CT suggesting infection. No definite effusion. Multiple known calcified nodular pleural plaques. Stable cardiomegaly. Remainder of the exam is unchanged. IMPRESSION: 1. Stable patchy bilateral airspace process right worse than left  likely infection. 2. Stable cardiomegaly. 3. 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 Electronically Signed   By: Elberta Fortis M.D.   On: 03/21/2020 09:27   DG CHEST PORT 1 VIEW  Result Date: 03/19/2020 CLINICAL DATA:  Shortness of breath EXAM: PORTABLE CHEST 1 VIEW COMPARISON:  March 18, 2020 FINDINGS: Multiple granulomas bilaterally are again noted. Bibasilar atelectasis appears stable. No edema or consolidation. Areas of pleural calcification noted. There is cardiomegaly with pulmonary vascularity normal. There is aortic atherosclerosis. No bone lesions IMPRESSION: Multiple calcified granulomas as well as pleural calcifications appear stable. Bibasilar atelectasis. No appreciable new opacity. Stable cardiomegaly. Aortic Atherosclerosis (ICD10-I70.0). Electronically Signed   By: Bretta Bang III M.D.   On:  03/19/2020 08:05   DG CHEST PORT 1 VIEW  Result Date: 03/18/2020 CLINICAL DATA:  Shortness of breath EXAM: PORTABLE CHEST 1 VIEW COMPARISON:  March 08, 2020 FINDINGS: Multiple granulomas are noted throughout the lungs bilaterally. There is bibasilar atelectatic change. Heart is enlarged with pulmonary vascularity within normal limits. There is aortic atherosclerosis. No adenopathy evident. No bone lesions. IMPRESSION: Extensive granulomatous disease. Bibasilar atelectasis. Earliest changes of pneumonia potentially present in the right base. Stable cardiomegaly. Aortic Atherosclerosis (ICD10-I70.0). Electronically Signed   By: Bretta Bang III M.D.   On: 03/18/2020 15:58   DG CHEST PORT 1 VIEW  Result Date: 03/08/2020 CLINICAL DATA:  Shortness of breath.  COVID-19 positive EXAM: PORTABLE CHEST 1 VIEW COMPARISON:  March 06, 2020 FINDINGS: Multiple calcified granulomas are noted throughout the lungs. There is slight right base atelectasis. Lungs elsewhere are clear. Stable cardiac prominence is noted. The pulmonary vascularity is within normal limits. No adenopathy. There is aortic atherosclerosis. No bone lesions. IMPRESSION: Calcified granulomas throughout lungs are stable. Mild right base atelectasis. Lungs elsewhere clear. Stable cardiac prominence. Aortic Atherosclerosis (ICD10-I70.0). Electronically Signed   By: Bretta Bang III M.D.   On: 03/08/2020 08:47   DG CHEST PORT 1 VIEW  Result Date: 03/06/2020 CLINICAL DATA:  Shortness of breath.  COVID-19 positive EXAM: PORTABLE CHEST 1 VIEW COMPARISON:  March 05, 2020 FINDINGS: Multiple calcified granulomas are noted throughout the lungs. There is atelectasis in the lung bases. There is an equivocal small right pleural effusion. Heart is enlarged with pulmonary vascularity normal. There is aortic atherosclerosis. No bone lesions. IMPRESSION: Bibasilar atelectasis with equivocal right pleural effusion. No consolidation. Multiple calcified  granulomas throughout the lungs. Stable cardiomegaly. Aortic Atherosclerosis (ICD10-I70.0). Electronically Signed   By: Bretta Bang III M.D.   On: 03/06/2020 09:27   DG CHEST PORT 1 VIEW  Result Date: 03/05/2020 CLINICAL DATA:  80 year old female with shortness of breath. Emphysema. Calcified pleural disease. EXAM: PORTABLE CHEST 1 VIEW COMPARISON:  Portable chest 03/04/2020 and earlier. FINDINGS: Portable AP upright view at 0437 hours. Rotated to the right today. Calcified aortic atherosclerosis. Stable cardiomegaly and mediastinal contours. Coarse and calcified bilateral lung opacity is stable. No pneumothorax or pleural effusion identified. No areas of worsening ventilation allowing for patient rotation. No acute osseous abnormality identified. IMPRESSION: 1. Chronic lung disease and cardiomegaly. 2.  No acute cardiopulmonary abnormality identified. Electronically Signed   By: Odessa Fleming M.D.   On: 03/05/2020 06:43   DG CHEST PORT 1 VIEW  Result Date: 03/04/2020 CLINICAL DATA:  80 year old female with shortness of breath. EXAM: PORTABLE CHEST 1 VIEW COMPARISON:  Chest radiograph dated 02/26/2020. FINDINGS: Bilateral calcified pleural plaques. There is background of emphysema. No new consolidation. There is no pleural effusion pneumothorax. There is moderate  cardiomegaly. Atherosclerotic calcification of the aorta. No acute osseous pathology. IMPRESSION: 1. No acute cardiopulmonary process. 2. Emphysema and calcified pleural plaques. Electronically Signed   By: Elgie Collard M.D.   On: 03/04/2020 17:32   DG Swallowing Func-Speech Pathology  Result Date: 03/22/2020 Objective Swallowing Evaluation: Type of Study: MBS-Modified Barium Swallow Study  Patient Details Name: Patricia Terrell MRN: [id number] Date of Birth: 1940-01-29 Today's Date: 03/22/2020 Time: SLP Start Time (ACUTE ONLY): 1333 -SLP Stop Time (ACUTE ONLY): 1405 SLP Time Calculation (min) (ACUTE ONLY): 32 min Past Medical History: Past  Medical History: Diagnosis Date . Emphysema lung (HCC)  . H/O: rheumatic fever  . HTN (hypertension)  . Liver cyst  . PUD (peptic ulcer disease)  Past Surgical History: Past Surgical History: Procedure Laterality Date . CESAREAN SECTION   HPI: Patient is a 80 y.o. female with PMH: dementia, COPD with emphysema, HTN, admitted 1/21-1/24/22 for COPD exacerbation then found to be COVID-19 positive and treated at home. She was then readmitted 2/3 for SOB and was diagnosed with worsening COVID-19 PNA and chronic diastolic CHF and was admitted to hospital during which time she developed worsening encephalopathy and became aggressive; psych was consulted and she has been IVC to be placed in inpatient psych facility. Patient moved from [place] with her daughter one month ago.  Assessment / Plan / Recommendation CHL IP CLINICAL IMPRESSIONS 03/22/2020 Clinical Impression Pt requested to go to the restroom upon arrival to radiology suite. Pt ambulated to the restroom with nasal cannula in place and on 3L, but it was out of her nose when she was assisted back to the chair and pt reported that she was not feeling well. Vitals were checked and pt's SpO2 was in the low to mid 40s. The unit was contacted by radiology tech and Laymond Purser advised that her oxygen can be increased. It was increased to 5L by SLP and Florentina Addison, RN increased it further to 6L upon arrival to the radiology suite. Pt's SpO2 ultimately improved to 89% and was 90-91% during the study. Pt presented with oropharyngeal dysphagia characterized by impaired bolus cohesion, impaired posterior propulsion of the barium tablet which was ultimately spat out, and a pharyngeal delay. She demonstrated penetration (PAS 3, 5) before and during deglutition and silent aspiration (PAS 8) of thin liquids. A single instance of sensed aspiration (PAS 7) was noted with nectar thick liquids via straw due to impaired timing. Prompted coughing was occasionally effective, but it was mostly  too weak to expel penetrated/aspirated material. Postural modifications were attempted, but pt exhibited difficulty following the necessary commands. Considering reports of pt's suboptimal p.o. intake, a regular texture diet will be continued to maximize the pt's options. Nectar thick liquids are recommended via cup. SLP will follow for dysphagia treatment. SLP Visit Diagnosis Dysphagia, oropharyngeal phase (R13.12) Attention and concentration deficit following -- Frontal lobe and executive function deficit following -- Impact on safety and function Mild aspiration risk   CHL IP TREATMENT RECOMMENDATION 03/22/2020 Treatment Recommendations Therapy as outlined in treatment plan below   Prognosis 03/22/2020 Prognosis for Safe Diet Advancement Good Barriers to Reach Goals -- Barriers/Prognosis Comment -- CHL IP DIET RECOMMENDATION 03/22/2020 SLP Diet Recommendations Regular solids;Nectar thick liquid Liquid Administration via Cup;No straw Medication Administration Whole meds with puree Compensations Minimize environmental distractions;Slow rate;Small sips/bites Postural Changes Remain semi-upright after after feeds/meals (Comment)   CHL IP OTHER RECOMMENDATIONS 03/22/2020 Recommended Consults -- Oral Care Recommendations Oral care BID Other Recommendations Order thickener from pharmacy   [facility]  IP FOLLOW UP RECOMMENDATIONS 03/22/2020 Follow up Recommendations (No Data)   CHL IP FREQUENCY AND DURATION 03/22/2020 Speech Therapy Frequency (ACUTE ONLY) min 2x/week Treatment Duration 2 weeks      CHL IP ORAL PHASE 03/22/2020 Oral Phase Impaired Oral - Pudding Teaspoon -- Oral - Pudding Cup -- Oral - Honey Teaspoon -- Oral - Honey Cup -- Oral - Nectar Teaspoon -- Oral - Nectar Cup -- Oral - Nectar Straw -- Oral - Thin Teaspoon -- Oral - Thin Cup Premature spillage;Decreased bolus cohesion Oral - Thin Straw Premature spillage;Decreased bolus cohesion Oral - Puree Premature spillage;Decreased bolus cohesion Oral - Mech Soft -- Oral -  Regular Premature spillage Oral - Multi-Consistency -- Oral - Pill Reduced posterior propulsion Oral Phase - Comment --  CHL IP PHARYNGEAL PHASE 03/22/2020 Pharyngeal Phase Impaired Pharyngeal- Pudding Teaspoon -- Pharyngeal -- Pharyngeal- Pudding Cup -- Pharyngeal -- Pharyngeal- Honey Teaspoon -- Pharyngeal -- Pharyngeal- Honey Cup -- Pharyngeal -- Pharyngeal- Nectar Teaspoon -- Pharyngeal -- Pharyngeal- Nectar Cup Delayed swallow initiation-vallecula Pharyngeal -- Pharyngeal- Nectar Straw Delayed swallow initiation-pyriform sinuses;Penetration/Aspiration during swallow Pharyngeal Material enters airway, passes BELOW cords and not ejected out despite cough attempt by patient Pharyngeal- Thin Teaspoon -- Pharyngeal -- Pharyngeal- Thin Cup Delayed swallow initiation-pyriform sinuses;Penetration/Aspiration before swallow;Penetration/Aspiration during swallow Pharyngeal Material enters airway, remains ABOVE vocal cords and not ejected out;Material enters airway, CONTACTS cords and not ejected out;Material enters airway, passes BELOW cords without attempt by patient to eject out (silent aspiration) Pharyngeal- Thin Straw Delayed swallow initiation-pyriform sinuses;Penetration/Aspiration during swallow Pharyngeal Material enters airway, passes BELOW cords without attempt by patient to eject out (silent aspiration);Material enters airway, remains ABOVE vocal cords and not ejected out;Material enters airway, CONTACTS cords and not ejected out Pharyngeal- Puree Delayed swallow initiation-vallecula Pharyngeal -- Pharyngeal- Mechanical Soft -- Pharyngeal -- Pharyngeal- Regular Delayed swallow initiation-vallecula Pharyngeal -- Pharyngeal- Multi-consistency -- Pharyngeal -- Pharyngeal- Pill -- Pharyngeal -- Pharyngeal Comment --  CHL IP CERVICAL ESOPHAGEAL PHASE 03/22/2020 Cervical Esophageal Phase WFL Pudding Teaspoon -- Pudding Cup -- Honey Teaspoon -- Honey Cup -- Nectar Teaspoon -- Nectar Cup -- Nectar Straw -- Thin Teaspoon  -- Thin Cup -- Thin Straw -- Puree -- Mechanical Soft -- Regular -- Multi-consistency -- Pill -- Cervical Esophageal Comment -- Shanika I. Vear ClockPhillips, MS, CCC-SLP Acute Rehabilitation Services Office number [phone] Pager [phone] Scheryl MartenShanika I Phillips 03/22/2020, 3:15 PM              VAS US LOWER EXTREMITY VENOUS (DVT)  Result Date: 03/18/2020  Lower Venous DVT Study Indications: Swelling.  Risk Factors: None identified. Comparison Study: No previous Performing Technologist: Clint GuyLisa Gibson RVT  Examination Guidelines: A complete evaluation includes B-mode imaging, spectral Doppler, color Doppler, and power Doppler as needed of all accessible portions of each vessel. Bilateral testing is considered an integral part of a complete examination. Limited examinations for reoccurring indications may be performed as noted. The reflux portion of the exam is performed with the patient in reverse Trendelenburg.  +---------+---------------+---------+-----------+----------+--------------+ RIGHT    CompressibilityPhasicitySpontaneityPropertiesThrombus Aging +---------+---------------+---------+-----------+----------+--------------+ CFV      Full           Yes      Yes                                 +---------+---------------+---------+-----------+----------+--------------+ SFJ      Full                                                        +---------+---------------+---------+-----------+----------+--------------+  FV Prox  Full                                                        +---------+---------------+---------+-----------+----------+--------------+ FV Mid   Full                                                        +---------+---------------+---------+-----------+----------+--------------+ FV DistalFull                                                        +---------+---------------+---------+-----------+----------+--------------+ PFV      Full                                                         +---------+---------------+---------+-----------+----------+--------------+ POP      Full           Yes      Yes                                 +---------+---------------+---------+-----------+----------+--------------+ PTV      Full                                                        +---------+---------------+---------+-----------+----------+--------------+ PERO     Full                                                        +---------+---------------+---------+-----------+----------+--------------+   +---------+---------------+---------+-----------+----------+--------------+ LEFT     CompressibilityPhasicitySpontaneityPropertiesThrombus Aging +---------+---------------+---------+-----------+----------+--------------+ CFV      Full           Yes      Yes                                 +---------+---------------+---------+-----------+----------+--------------+ SFJ      Full                                                        +---------+---------------+---------+-----------+----------+--------------+ FV Prox  Full                                                        +---------+---------------+---------+-----------+----------+--------------+  FV Mid   Full                                                        +---------+---------------+---------+-----------+----------+--------------+ FV DistalFull                                                        +---------+---------------+---------+-----------+----------+--------------+ PFV      Full                                                        +---------+---------------+---------+-----------+----------+--------------+ POP      Full           Yes      Yes                                 +---------+---------------+---------+-----------+----------+--------------+ PTV      Full                                                         +---------+---------------+---------+-----------+----------+--------------+ PERO     Full                                                        +---------+---------------+---------+-----------+----------+--------------+     Summary: BILATERAL: - No evidence of deep vein thrombosis seen in the lower extremities, bilaterally. -No evidence of popliteal cyst, bilaterally.   *See table(s) above for measurements and observations. Electronically signed by Heath Lark on 03/18/2020 at 8:05:54 PM.    Final        Subjective: Patient seen and examined at bedside.  Overnight events noted.  Patient seems improved, Patient has ambulated in the hallway without oxygen,  feels better denies any shortness of breath.  Patient wants to be discharged home.  Discharge Exam: Vitals:   04/02/20 0810 04/02/20 0815  BP: (!) 130/91   Pulse: (!) 52   Resp: 17   Temp: 97.8 F (36.6 C)   SpO2: 97% 93%   Vitals:   04/01/20 2141 04/02/20 0500 04/02/20 0810 04/02/20 0815  BP: (!) 100/45  (!) 130/91   Pulse: (!) 55  (!) 52   Resp: 16  17   Temp: (!) 97.4 F (36.3 C)  97.8 F (36.6 C)   TempSrc:   Oral   SpO2: 97%  97% 93%  Weight:  49.9 kg    Height:        General: Pt is alert, awake, not in acute distress Cardiovascular: RRR, S1/S2 +, no rubs, no gallops Respiratory: CTA bilaterally, no wheezing, no rhonchi Abdominal: Soft, NT, ND, bowel sounds +  Extremities: no edema, no cyanosis    The results of significant diagnostics from this hospitalization (including imaging, microbiology, ancillary and laboratory) are listed below for reference.     Microbiology: No results found for this or any previous visit (from the past 240 hour(s)).   Labs: BNP (last 3 results) Recent Labs    03/27/20 0210 03/28/20 0102 03/29/20 0247  BNP 544.6* 237.6* 549.8*   Basic Metabolic Panel: Recent Labs  Lab 03/27/20 0210 03/28/20 0056 03/29/20 0247 03/29/20 1133 03/30/20 0132 03/30/20 0724  04/01/20 0158 04/02/20 0312  NA 139 140 142  --  140  --  139 140  K 4.4 3.5 5.0 4.3 5.3* 4.6 5.1 4.3  CL 92* 90* 99  --  98  --  97* 96*  CO2 38* 42* 34*  --  37*  --  37* 38*  GLUCOSE 108* 96 97  --  96  --  95 85  BUN 32* 23 32*  --  44*  --  48* 40*  CREATININE 0.87 0.83 0.94  --  0.96  --  1.22* 1.02*  CALCIUM 9.4 9.1 9.2  --  9.4  --  9.2 9.1  MG 1.9  --   --   --  2.3  --   --   --   PHOS  --   --   --   --  2.8  --   --   --    Liver Function Tests: Recent Labs  Lab 03/30/20 0132  AST 25  ALT 20  ALKPHOS 53  BILITOT 0.7  PROT 6.1*  ALBUMIN 2.2*   No results for input(s): LIPASE, AMYLASE in the last 168 hours. No results for input(s): AMMONIA in the last 168 hours. CBC: Recent Labs  Lab 03/30/20 0132 04/02/20 0312  WBC 7.4  --   HGB 10.2* 9.5*  HCT 32.8* 29.9*  MCV 92.4  --   PLT 339  --    Cardiac Enzymes: No results for input(s): CKTOTAL, CKMB, CKMBINDEX, TROPONINI in the last 168 hours. BNP: Invalid input(s): POCBNP CBG: No results for input(s): GLUCAP in the last 168 hours. D-Dimer No results for input(s): DDIMER in the last 72 hours. Hgb A1c No results for input(s): HGBA1C in the last 72 hours. Lipid Profile No results for input(s): CHOL, HDL, LDLCALC, TRIG, CHOLHDL, LDLDIRECT in the last 72 hours. Thyroid function studies No results for input(s): TSH, T4TOTAL, T3FREE, THYROIDAB in the last 72 hours.  Invalid input(s): FREET3 Anemia work up No results for input(s): VITAMINB12, FOLATE, FERRITIN, TIBC, IRON, RETICCTPCT in the last 72 hours. Urinalysis    Component Value Date/Time   COLORURINE YELLOW 02/24/2020 1743   APPEARANCEUR CLEAR 02/24/2020 1743   LABSPEC 1.025 02/24/2020 1743   PHURINE 6.0 02/24/2020 1743   GLUCOSEU NEGATIVE 02/24/2020 1743   HGBUR NEGATIVE 02/24/2020 1743   BILIRUBINUR NEGATIVE 02/24/2020 1743   KETONESUR NEGATIVE 02/24/2020 1743   PROTEINUR NEGATIVE 02/24/2020 1743   NITRITE NEGATIVE 02/24/2020 1743    LEUKOCYTESUR NEGATIVE 02/24/2020 1743   Sepsis Labs Invalid input(s): PROCALCITONIN,  WBC,  LACTICIDVEN Microbiology No results found for this or any previous visit (from the past 240 hour(s)).   Time coordinating discharge: Over 30 minutes  SIGNED:   Cipriano Bunker, MD  [facility] 04/02/2020, 10:55 AM Pager   If 7PM-7AM, please contact night-coverage [email or web address]

## 2020-04-02 NOTE — Discharge Instructions (Signed)
Advised to follow-up with primary care physician in 1 week.   Advised to follow-up with psychiatrist appointment has been made. Advised to take metoprolol 100 mg twice daily. Advised to continue supplemental oxygen is required.

## 2020-04-30 ENCOUNTER — Ambulatory Visit: Payer: Medicare HMO | Admitting: Cardiology

## 2020-04-30 ENCOUNTER — Ambulatory Visit: Payer: Medicare (Managed Care) | Admitting: Nurse Practitioner

## 2020-06-30 ENCOUNTER — Institutional Professional Consult (permissible substitution): Payer: Medicare HMO | Admitting: Pulmonary Disease

## 2020-12-23 DEATH — deceased

## 2021-08-08 IMAGING — DX DG CHEST 1V PORT
1 series · 1 of 1 positions shown · non-contrast
Comparison: March 06, 2020

CLINICAL DATA: Shortness of breath.  YIMNV-TJ positive

EXAM:
PORTABLE CHEST 1 VIEW

[chest]
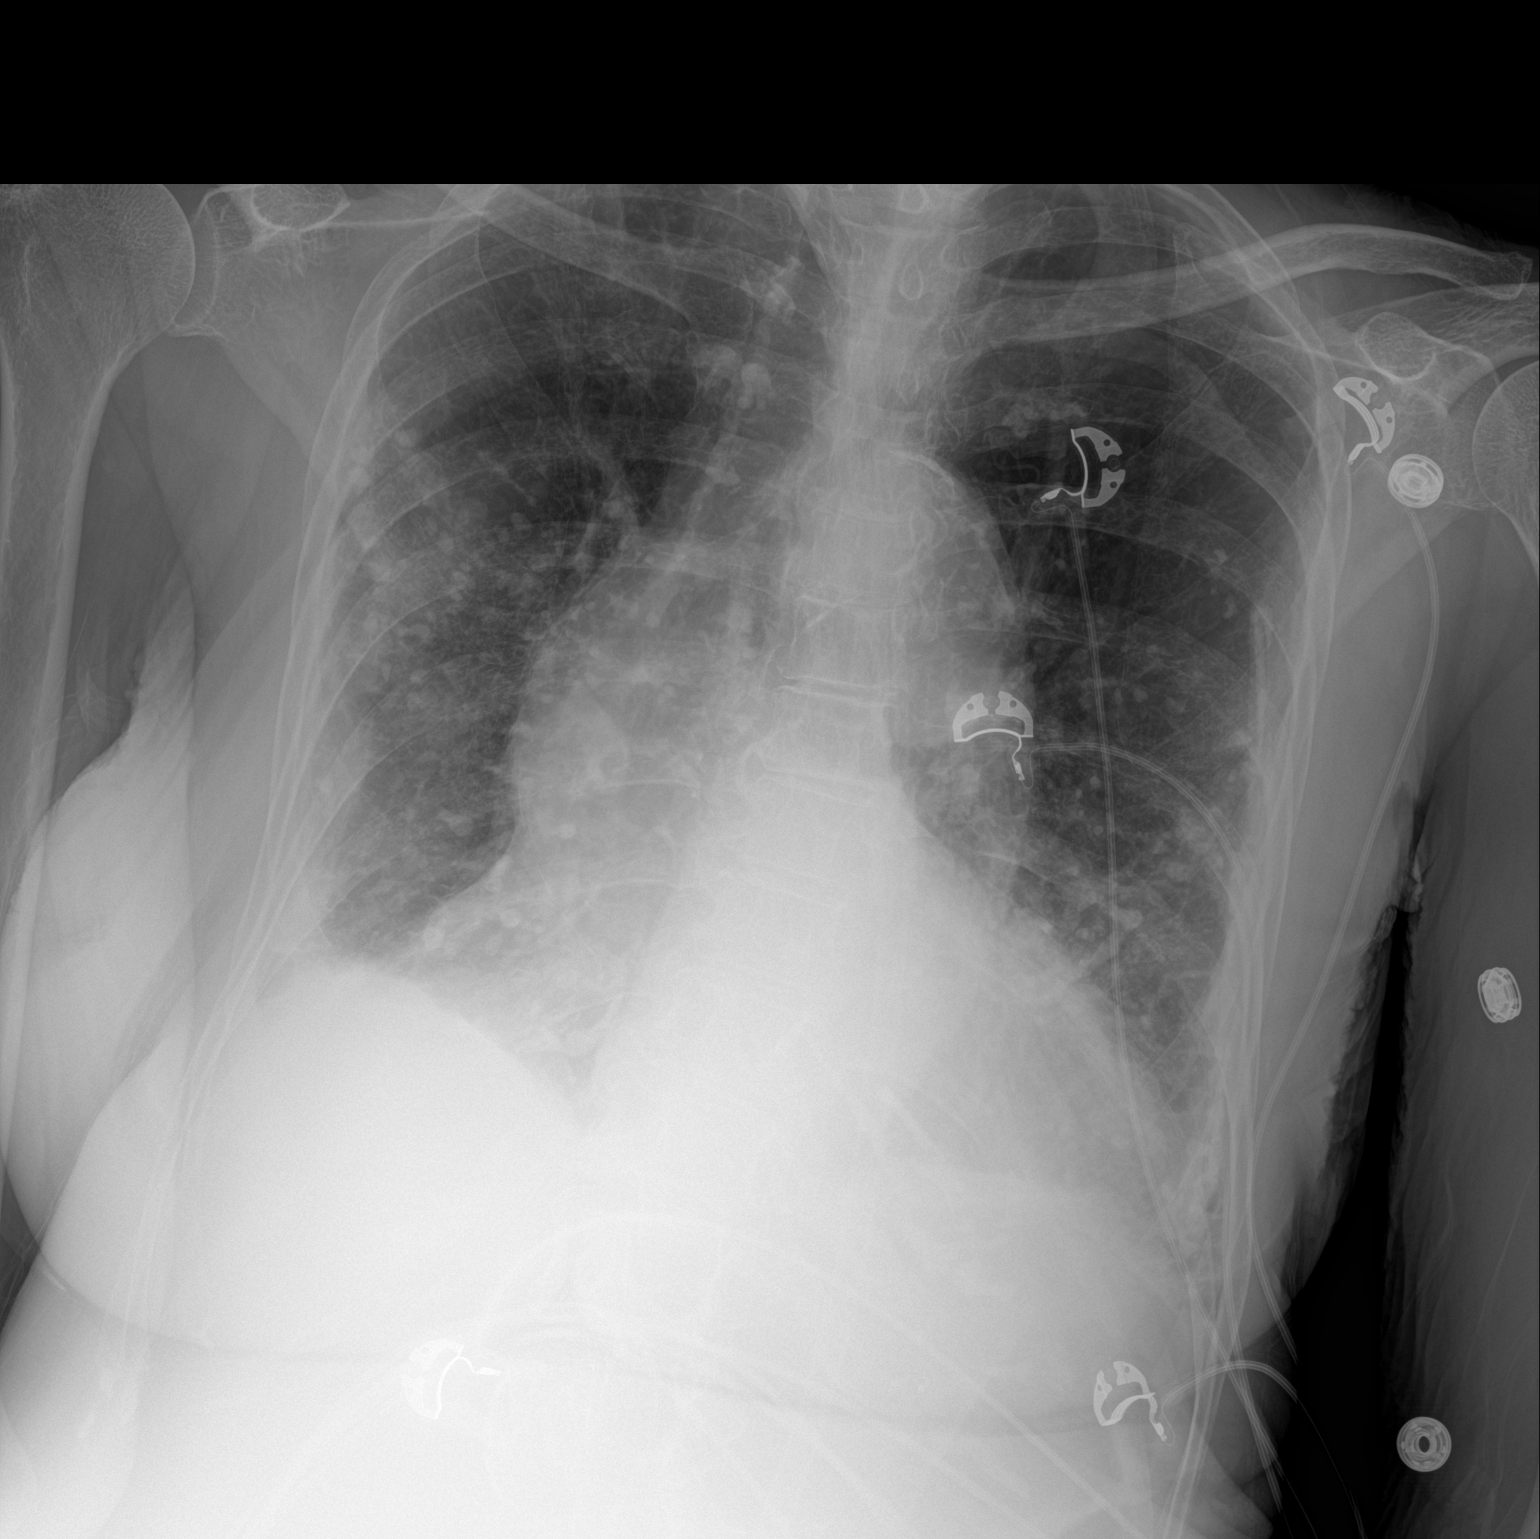

[1 of 1 positions shown; findings below may reference images not displayed]

FINDINGS: Multiple calcified granulomas are noted throughout the lungs. There
is slight right base atelectasis. Lungs elsewhere are clear. Stable
cardiac prominence is noted. The pulmonary vascularity is within
normal limits. No adenopathy. There is aortic atherosclerosis. No
bone lesions.
IMPRESSION: Calcified granulomas throughout lungs are stable. Mild right base
atelectasis. Lungs elsewhere clear. Stable cardiac prominence.
Aortic Atherosclerosis ([id number]).

## 2021-08-18 IMAGING — DX DG CHEST 1V PORT
1 series · 1 of 1 positions shown · non-contrast
Comparison: March 08, 2020

CLINICAL DATA: Shortness of breath

EXAM:
PORTABLE CHEST 1 VIEW

[chest ap]
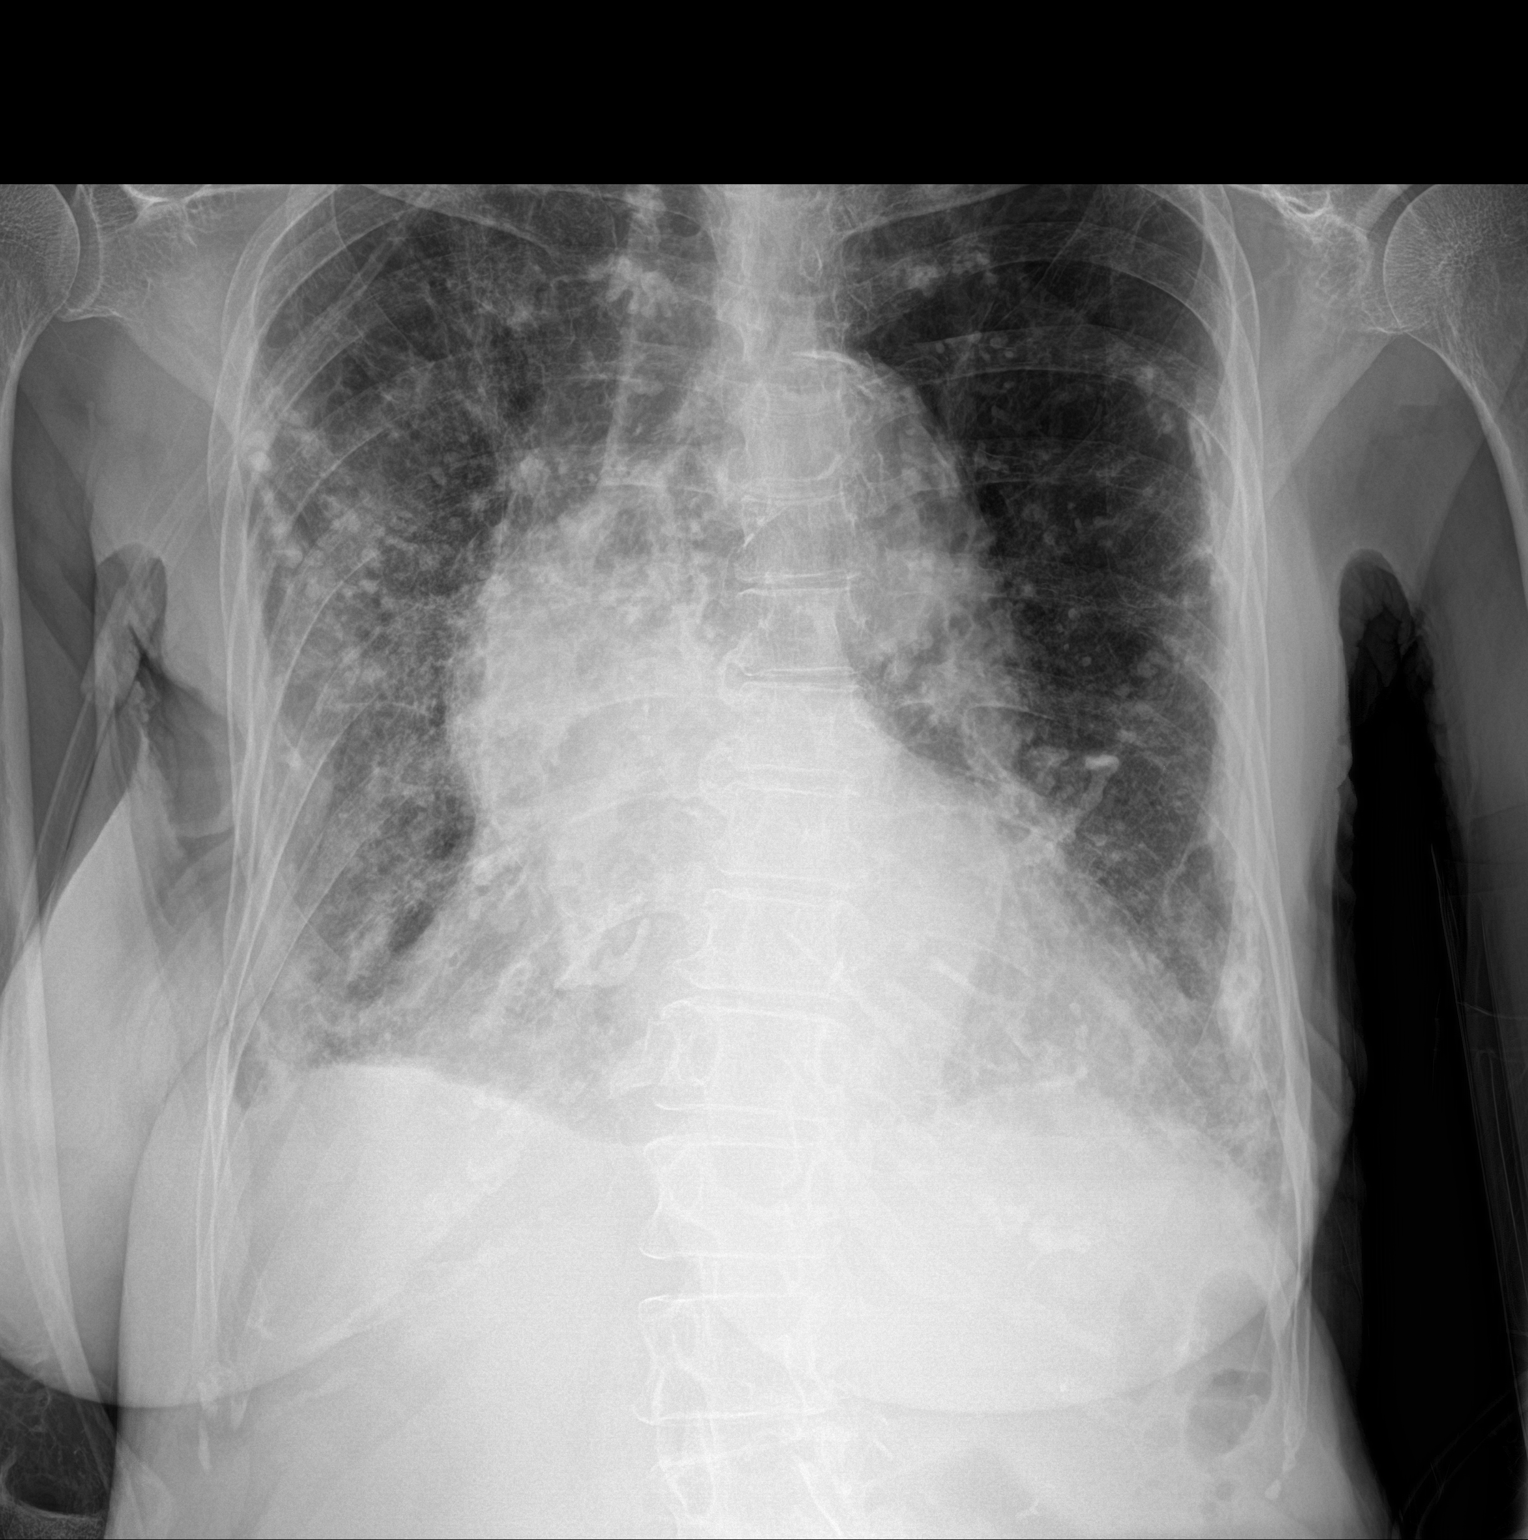

[1 of 1 positions shown; findings below may reference images not displayed]

FINDINGS: Multiple granulomas are noted throughout the lungs bilaterally.
There is bibasilar atelectatic change. Heart is enlarged with
pulmonary vascularity within normal limits. There is aortic
atherosclerosis. No adenopathy evident. No bone lesions.
IMPRESSION: Extensive granulomatous disease. Bibasilar atelectasis. Earliest
changes of pneumonia potentially present in the right base. Stable
cardiomegaly. Aortic Atherosclerosis ([id number]).

## 2021-08-21 IMAGING — DX DG CHEST 1V PORT
1 series · 1 of 1 positions shown · non-contrast
Comparison: 03/19/2020 and CT 03/19/2020

CLINICAL DATA: COPD exacerbation.

EXAM:
PORTABLE CHEST 1 VIEW

[chest]
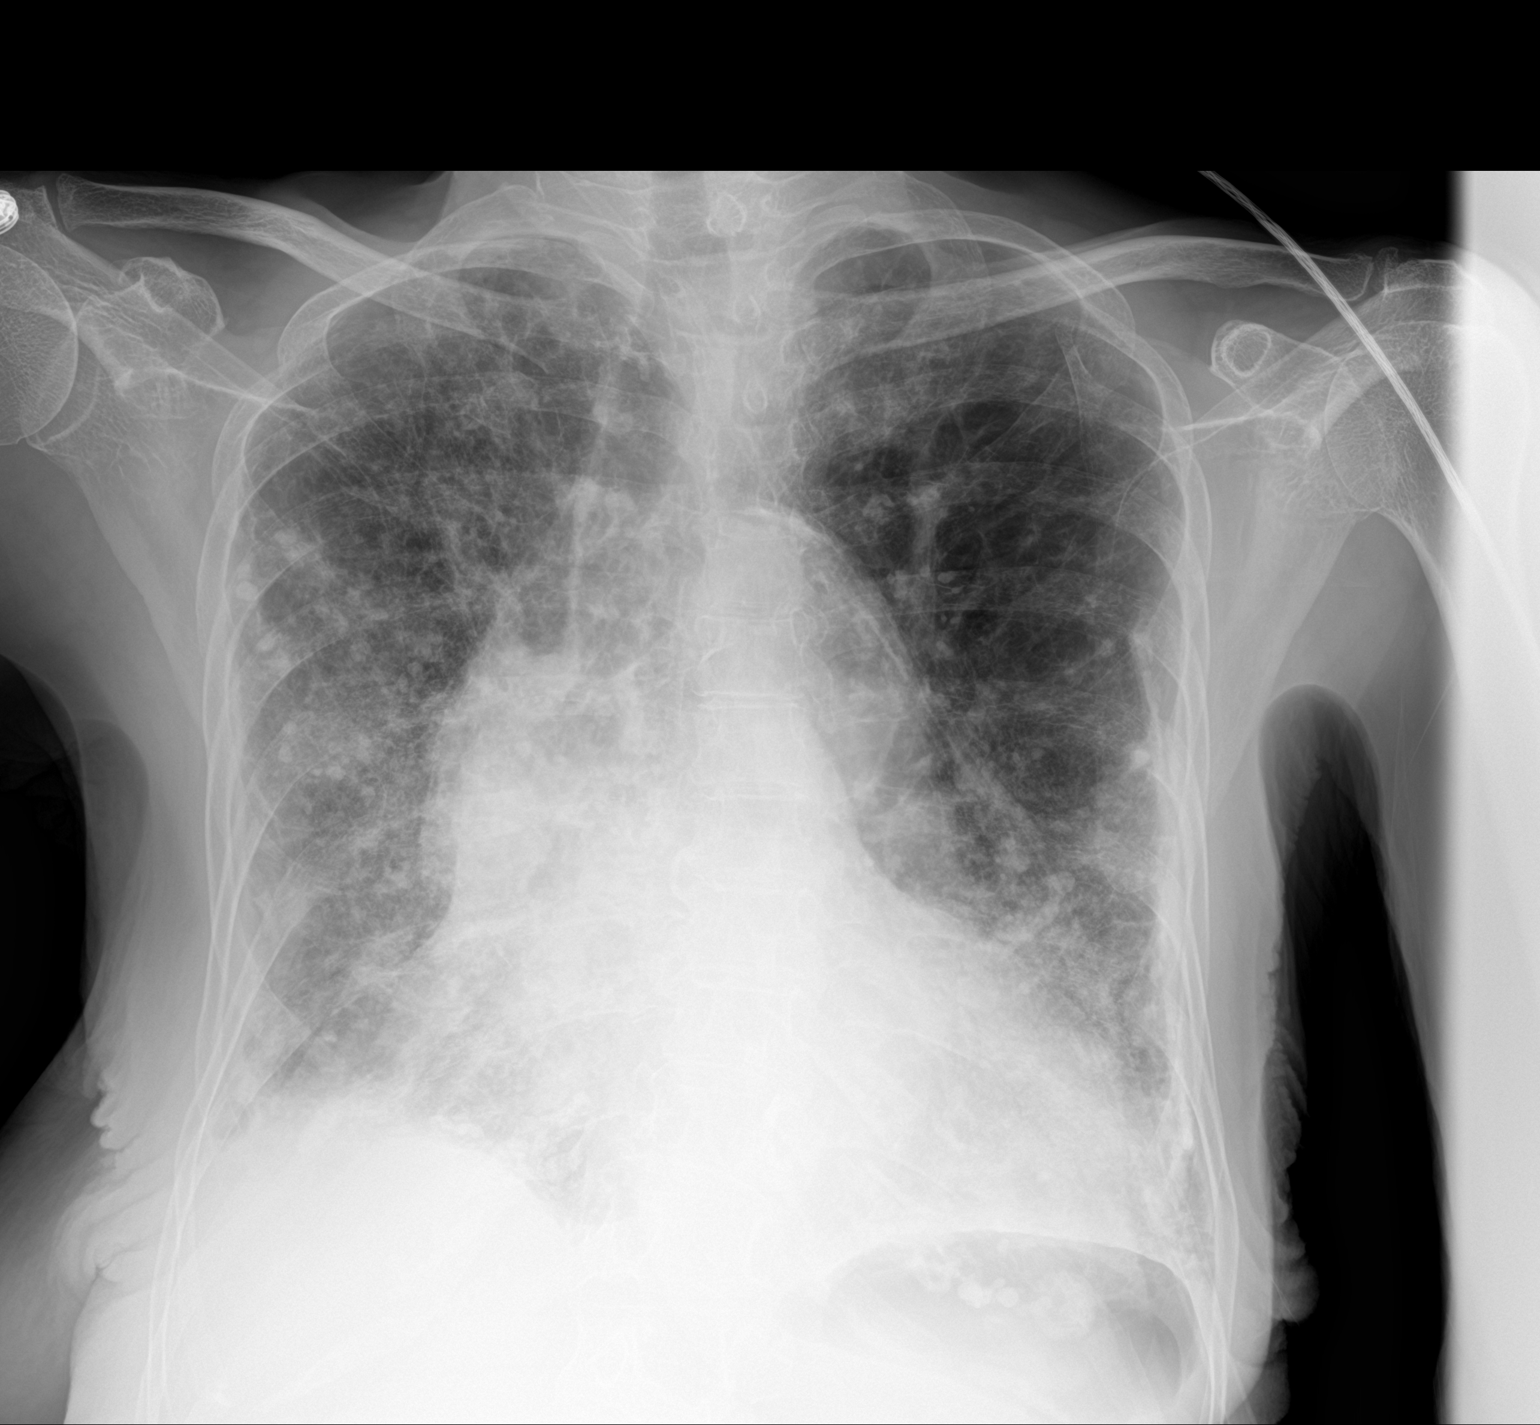

[1 of 1 positions shown; findings below may reference images not displayed]

FINDINGS: Lungs are adequately inflated demonstrate stable patchy airspace
opacification over the right mid and lower lung as well as left base
as seen on recent CT suggesting infection. No definite effusion.
Multiple known calcified nodular pleural plaques. Stable
cardiomegaly. Remainder of the exam is unchanged.
IMPRESSION: 1. Stable patchy bilateral airspace process right worse than left
likely infection.
2. Stable cardiomegaly.
3. Known pleural disease likely related to previous asbestos
exposure.

## 2021-08-22 IMAGING — DX DG CHEST 1V PORT
1 series · 1 of 1 positions shown · non-contrast
Comparison: 03/21/2020 and CTA of the chest on 03/19/2020

CLINICAL DATA: Shortness of breath and hypoxia.

EXAM:
PORTABLE CHEST 1 VIEW

[chest]
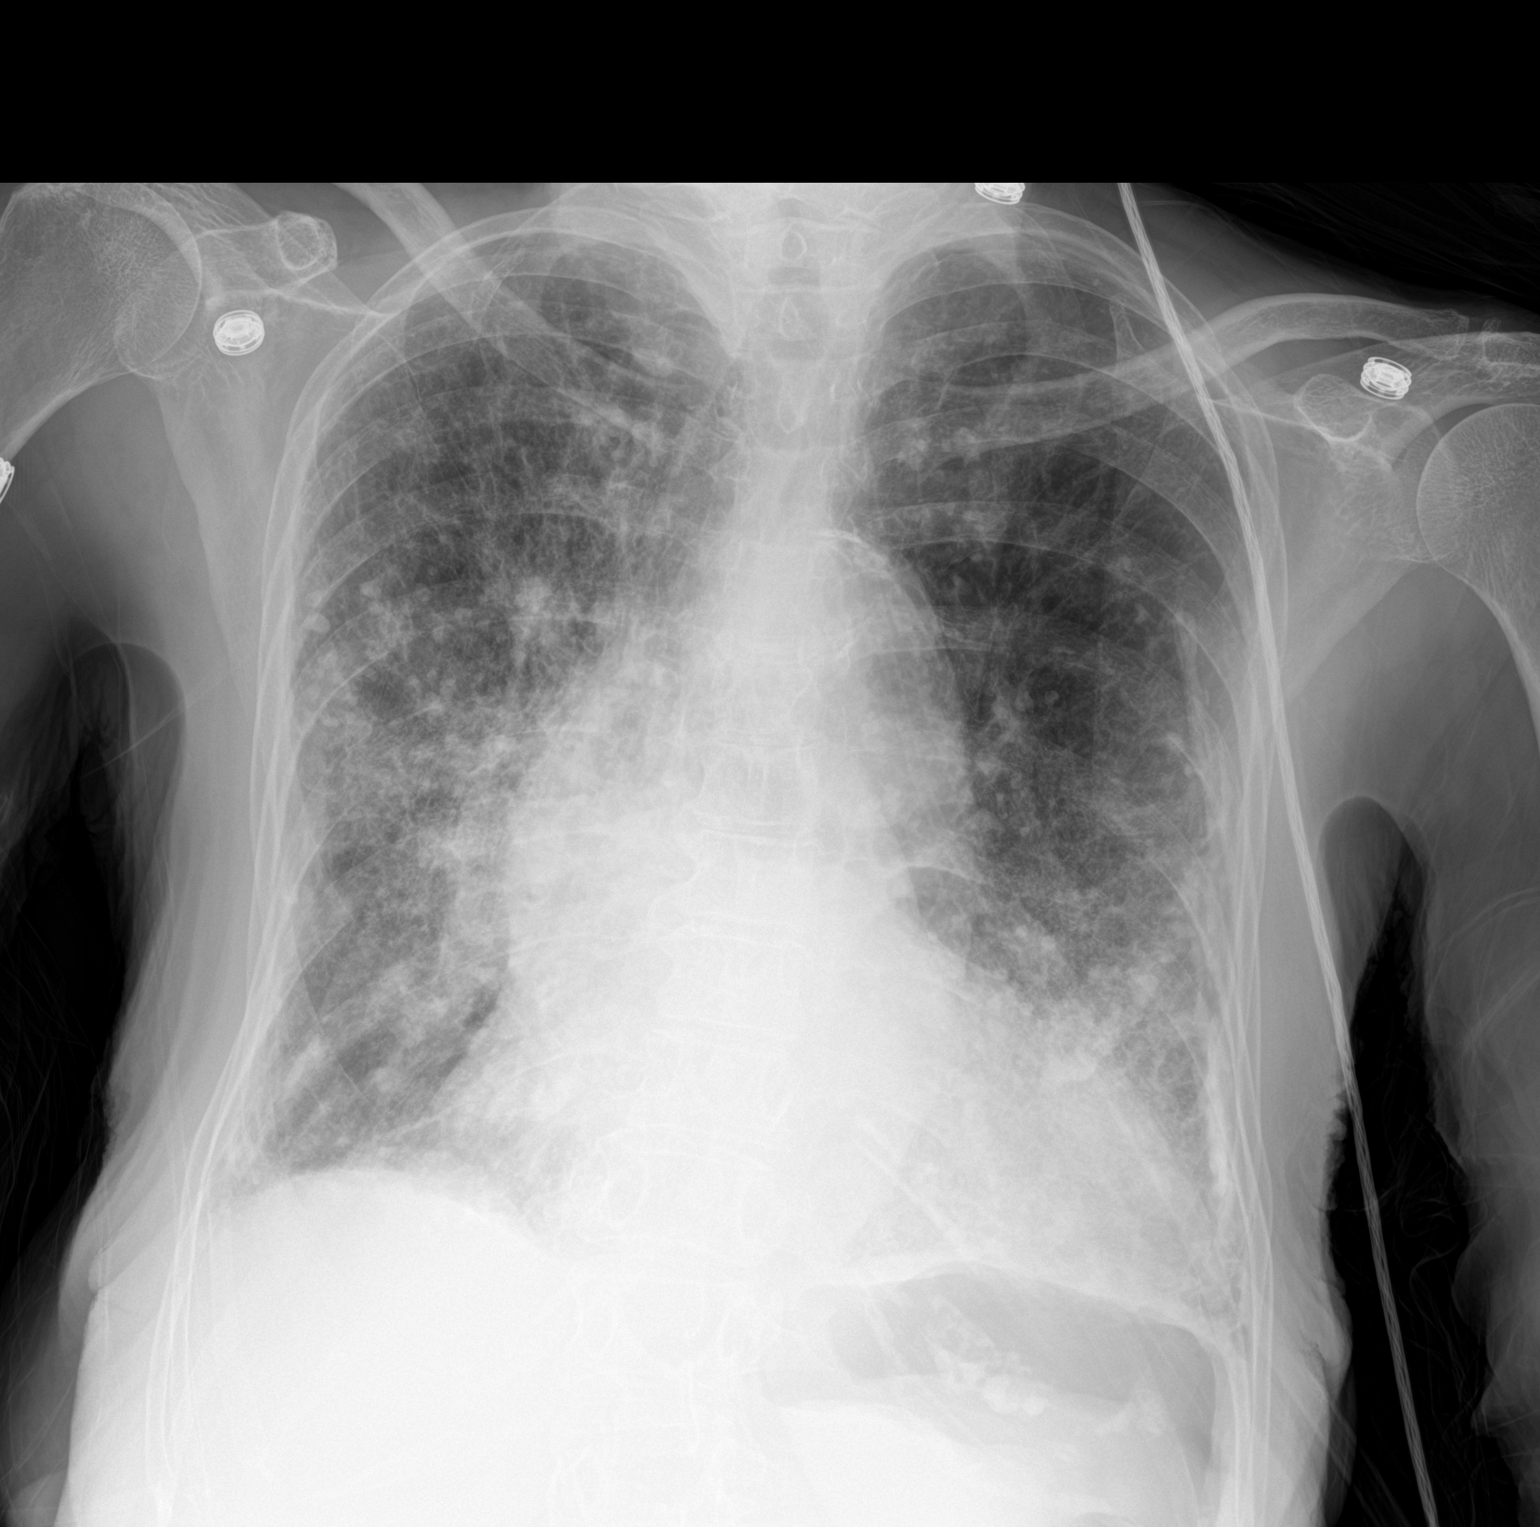

[1 of 1 positions shown; findings below may reference images not displayed]

FINDINGS: Stable cardiac enlargement and aortic tortuosity. Superimposed on
chronic lung disease is some increase in interstitial prominence and
pulmonary vascularity likely representing pulmonary interstitial
edema. Multiple areas of bilateral pleural calcification again
noted. No pleural effusions or pneumothorax identified.
IMPRESSION: 1. Increase in interstitial prominence and pulmonary vascularity
likely representing acute pulmonary interstitial edema superimposed
on chronic lung disease.
2. Stable cardiomegaly and aortic tortuosity.

## 2021-08-23 IMAGING — DX DG CHEST 1V PORT
1 series · 1 of 1 positions shown · non-contrast
Comparison: 03/22/2020

CLINICAL DATA: Shortness of breath

EXAM:
PORTABLE CHEST 1 VIEW

[chest ap]
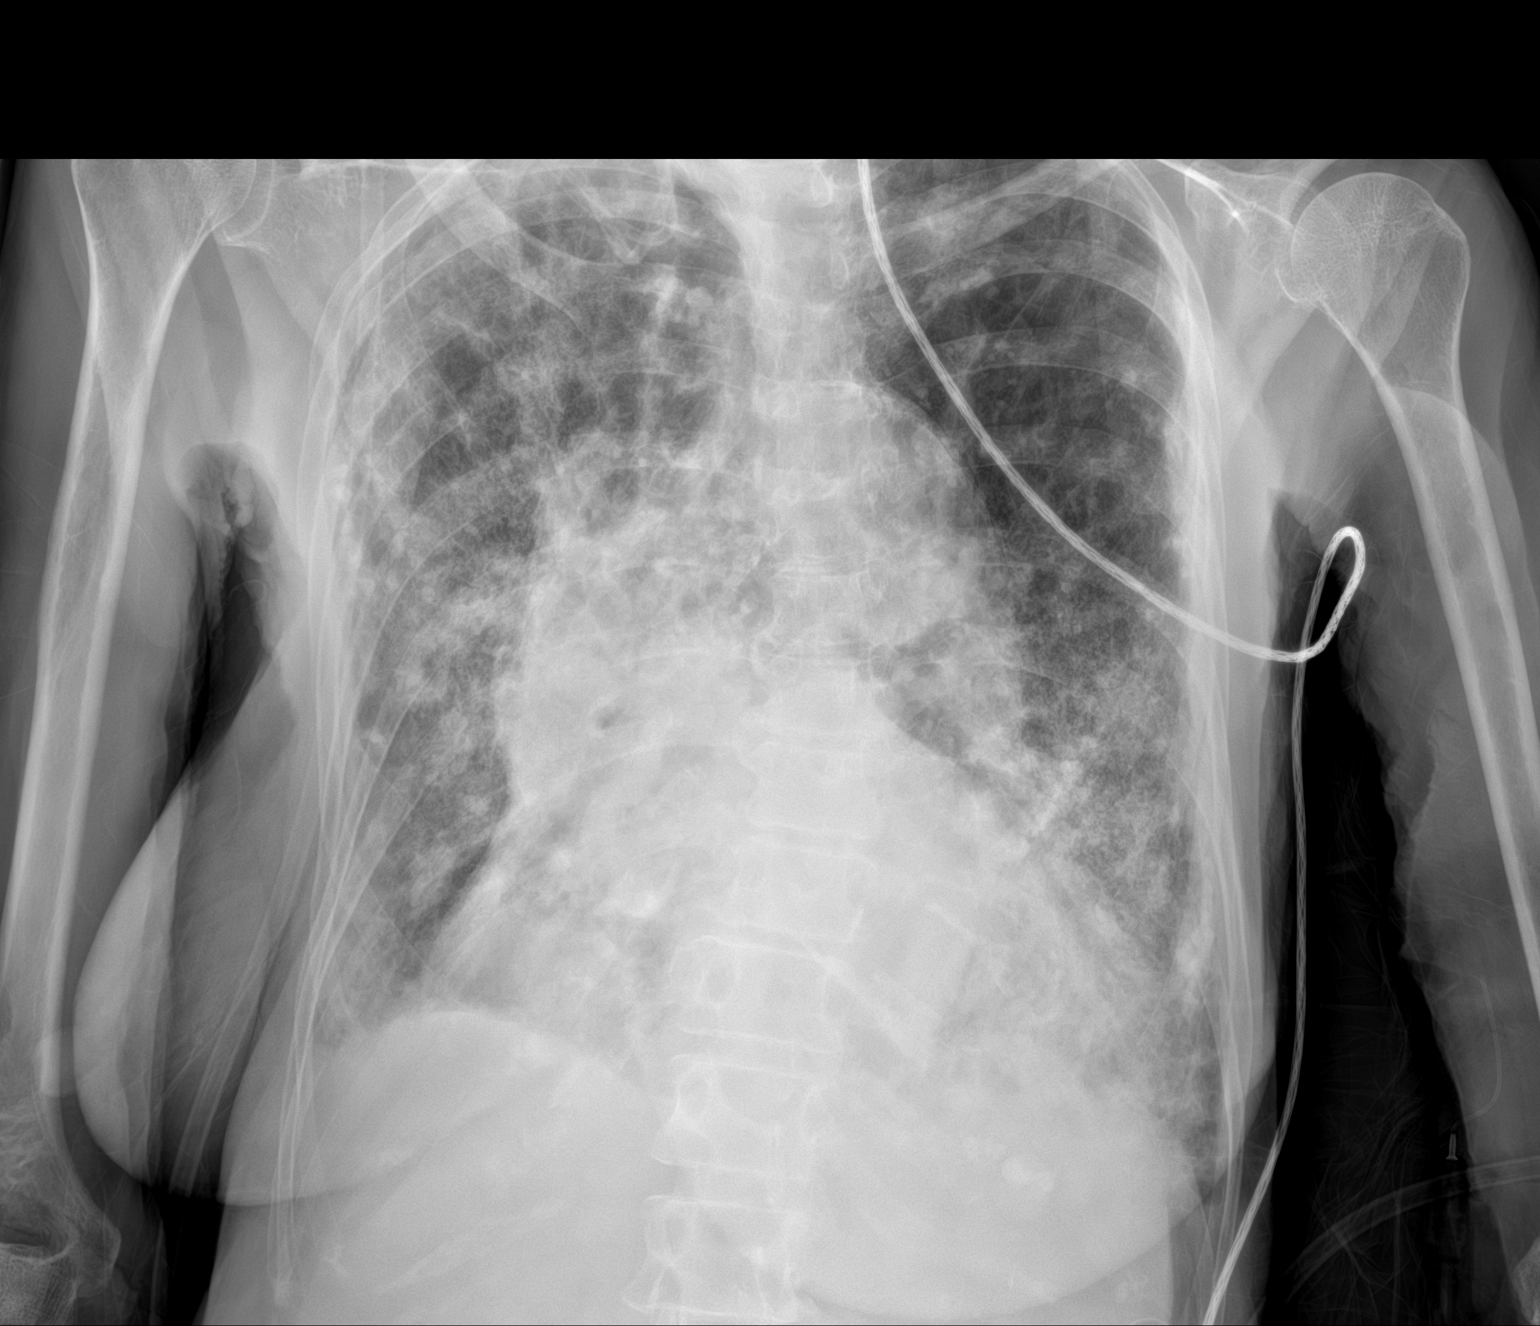

[1 of 1 positions shown; findings below may reference images not displayed]

FINDINGS: Cardiac shadow remains enlarged. Aortic calcifications are again
seen. Scattered calcified pleural plaques are again identified
bilaterally. Diffuse emphysematous changes are noted as well as
persistent consolidation in the right lower lobe stable from prior
exams. Given some technical variations in the imaging the overall
appearance is relatively stable. No bony abnormality is seen.
IMPRESSION: Persistent changes of prior asbestos exposure. Underlying
interstitial changes and right lower lobe consolidation are noted.
